# Patient Record
Sex: Male | Born: 1980 | Race: Black or African American | Hispanic: No | Marital: Single | Smoking: Never smoker
Health system: Southern US, Community
[De-identification: ages and names within clinical notes are randomized; demographics above are authoritative.]

## PROBLEM LIST (undated history)

## (undated) DIAGNOSIS — Z21 Asymptomatic human immunodeficiency virus [HIV] infection status: Secondary | ICD-10-CM

## (undated) DIAGNOSIS — B20 Human immunodeficiency virus [HIV] disease: Secondary | ICD-10-CM

## (undated) DIAGNOSIS — Z789 Other specified health status: Secondary | ICD-10-CM

## (undated) HISTORY — DX: Asymptomatic human immunodeficiency virus (hiv) infection status: Z21

## (undated) HISTORY — DX: Human immunodeficiency virus (HIV) disease: B20

## (undated) HISTORY — PX: NO PAST SURGERIES: SHX2092

---

## 2017-01-24 ENCOUNTER — Encounter (HOSPITAL_COMMUNITY): Payer: Self-pay | Admitting: Emergency Medicine

## 2017-01-24 ENCOUNTER — Ambulatory Visit (HOSPITAL_COMMUNITY)
Admission: EM | Admit: 2017-01-24 | Discharge: 2017-01-24 | Disposition: A | Discharge status: Home / Self Care | Payer: Commercial Managed Care - HMO | Attending: Nurse Practitioner | Admitting: Nurse Practitioner

## 2017-01-24 DIAGNOSIS — L0291 Cutaneous abscess, unspecified: Secondary | ICD-10-CM

## 2017-01-24 MED ORDER — SULFAMETHOXAZOLE-TRIMETHOPRIM 800-160 MG PO TABS: 1.0000 | ORAL_TABLET | Freq: Two times a day (BID) | ORAL | 0 refills | Status: AC

## 2017-01-24 NOTE — ED Triage Notes (Signed)
Here for abscess on right side of face near jaw onset 5 days associated w/some drainage, pain  Denies fevers, chills  A&O x4... NAD

## 2017-01-24 NOTE — ED Provider Notes (Signed)
CSN: [id number]     Arrival date & time 01/24/17  1506 History   First MD Initiated Contact with Patient 01/24/17 1558     Chief Complaint  Patient presents with  . Abscess   (Consider location/radiation/quality/duration/timing/severity/associated sxs/prior Treatment) Subjective:   Charles Morrison is a 36 y.o. male who presents for evaluation of an abscess located on the lower, left face. Onset of symptoms was gradual starting 4 days ago, with gradually worsening course since that time. Symptoms include pain and erythema located around the abscess. Patient denies chills and fever greater than 100. There is not a history of trauma to the area. Treatment to date has included warm compresses with minimal relief. The following portions of the patient's history were reviewed and updated as appropriate: allergies, current medications, past family history, past medical history, past social history, past surgical history and problem list.        History reviewed. No pertinent past medical history. History reviewed. No pertinent surgical history. History reviewed. No pertinent family history. Social History  Substance Use Topics  . Smoking status: Never Smoker  . Smokeless tobacco: Never Used  . Alcohol use Yes    Review of Systems  Skin: Positive for wound.    Allergies  Shellfish allergy  Home Medications   Prior to Admission medications   Medication Sig Start Date End Date Taking? Authorizing Provider  sulfamethoxazole-trimethoprim (BACTRIM DS,SEPTRA DS) 800-160 MG tablet Take 1 tablet by mouth 2 (two) times daily. 01/24/17 01/31/17  Lurline IdolSamantha Alayiah Fontes, FNP   Meds Ordered and Administered this Visit  Medications - No data to display  BP (!) 128/93 (BP Location: Left Arm) Comment: notified rn  Pulse 66   Temp 97.9 F (36.6 C) (Oral)   Resp 16   SpO2 99%  No data found.   Physical Exam  Constitutional: Charles Morrison is oriented to person, place, and time. Charles Morrison appears well-developed and  well-nourished. No distress.  HENT:  Head: Normocephalic and atraumatic.  Neck: Normal range of motion. Neck supple.  Cardiovascular: Normal rate and regular rhythm.   Pulmonary/Chest: Effort normal and breath sounds normal.  Musculoskeletal: Normal range of motion.  Neurological: Charles Morrison is alert and oriented to person, place, and time.  Skin: Skin is warm and dry.  There is an approximately 3 x 3 cm abscess to the left lower face with center area of fluctuance. No obvious surrounding cellulitis.    Urgent Care Course     .Marland Kitchen.Incision and Drainage Date/Time: 01/24/2017 4:03 PM Performed by: Lurline IdolMURRILL, Alphonsine Minium Authorized by: Lurline IdolMURRILL, Whittley Carandang   Consent:    Consent obtained:  Verbal   Consent given by:  Patient   Risks discussed:  Bleeding, incomplete drainage, infection and pain (need for further procedure)   Alternatives discussed:  No treatment Location:    Type:  Abscess   Size:  3x3 cm    Location: left lower face  Pre-procedure details:    Skin preparation:  Betadine Anesthesia (see MAR for exact dosages):    Anesthesia method:  Local infiltration   Local anesthetic:  Lidocaine 2% WITH epi Procedure type:    Complexity:  Simple Procedure details:    Needle aspiration: no     Incision types:  Single straight   Incision depth:  Subcutaneous   Scalpel blade:  10   Wound management:  Probed and deloculated   Drainage:  Bloody   Drainage amount:  Moderate   Wound treatment:  Wound left open   Packing materials:  1/4 in gauze  Post-procedure details:    Patient tolerance of procedure:  Tolerated well, no immediate complications   (including critical care time)  Labs Review Labs Reviewed - No data to display  Imaging Review No results found.   Visual Acuity Review  Right Eye Distance:   Left Eye Distance:   Bilateral Distance:    Right Eye Near:   Left Eye Near:    Bilateral Near:         MDM   1. Abscess    Abscess to left lower face, initial encounter    Antibiotics given. Wound care discussed  Educational material distributed. Follow-up in 3 days for wound check.     Lurline Idol, FNP 01/24/17 1616

## 2017-01-27 ENCOUNTER — Ambulatory Visit (HOSPITAL_COMMUNITY)
Admission: EM | Admit: 2017-01-27 | Discharge: 2017-01-27 | Disposition: A | Discharge status: Home / Self Care | Payer: Commercial Managed Care - HMO | Attending: Internal Medicine | Admitting: Internal Medicine

## 2017-01-27 ENCOUNTER — Encounter (HOSPITAL_COMMUNITY): Payer: Self-pay

## 2017-01-27 DIAGNOSIS — Z09 Encounter for follow-up examination after completed treatment for conditions other than malignant neoplasm: Secondary | ICD-10-CM

## 2017-01-27 DIAGNOSIS — L0291 Cutaneous abscess, unspecified: Secondary | ICD-10-CM

## 2017-01-27 NOTE — ED Triage Notes (Signed)
Pt here to f/u on his abscess on his left check. Pt states it isn't sore anymore and is still draining a small amount. No fever.

## 2017-01-27 NOTE — Discharge Instructions (Signed)
The packing has been removed from your wound, your wound appears healthy, without signs of infection. Continue to take the antibiotics that were prescribed at your previous visit, change the dressing daily, keep the wound clean, and dry. He can take up to 7 days or longer for it to fully close and heal. If you see any signs or symptoms of infection such as pus, expanding areas of redness, or pain, return to clinic.

## 2017-01-27 NOTE — ED Provider Notes (Signed)
CSN: [id number]     Arrival date & time 01/27/17  1212 History   First MD Initiated Contact with Patient 01/27/17 1313     Chief Complaint  Patient presents with  . Abscess   (Consider location/radiation/quality/duration/timing/severity/associated sxs/prior Treatment) 36 year old male presents to clinic for follow-up of an abscess. Patient was seen in clinic on 01/24/2017, where an I and D was performed, wound was packed with iodoform gauze, and started on Bactrim. Patient has had a significant improvement in symptoms, and continues to take the antibiotics as prescribed. He is here to have the packing removed.   The history is provided by the patient.  Abscess    History reviewed. No pertinent past medical history. History reviewed. No pertinent surgical history. No family history on file. Social History  Substance Use Topics  . Smoking status: Never Smoker  . Smokeless tobacco: Never Used  . Alcohol use Yes    Review of Systems  Constitutional: Negative.   HENT: Negative.   Respiratory: Negative.   Cardiovascular: Negative.   Gastrointestinal: Negative.   Musculoskeletal: Negative.   Skin: Positive for wound.  Neurological: Negative.     Allergies  Shellfish allergy  Home Medications   Prior to Admission medications   Medication Sig Start Date End Date Taking? Authorizing Provider  sulfamethoxazole-trimethoprim (BACTRIM DS,SEPTRA DS) 800-160 MG tablet Take 1 tablet by mouth 2 (two) times daily. 01/24/17 01/31/17  Lurline IdolMurrill, Samantha, FNP   Meds Ordered and Administered this Visit  Medications - No data to display  BP 104/76 (BP Location: Left Arm)   Pulse 80   Temp 97.7 F (36.5 C) (Oral)   Resp 16   SpO2 98%  No data found.   Physical Exam  Constitutional: He is oriented to person, place, and time. He appears well-developed and well-nourished. No distress.  HENT:  Head: Normocephalic and atraumatic.    Right Ear: External ear normal.  Left Ear: External ear  normal.  Eyes: Conjunctivae are normal. Right eye exhibits no discharge. Left eye exhibits no discharge.  Neck: Normal range of motion.  Cardiovascular: Normal rate and regular rhythm.   Pulmonary/Chest: Effort normal and breath sounds normal.  Lymphadenopathy:    He has no cervical adenopathy.  Neurological: He is alert and oriented to person, place, and time.  Skin: Skin is warm and dry. Capillary refill takes less than 2 seconds. He is not diaphoretic.  Psychiatric: He has a normal mood and affect. His behavior is normal.  Nursing note and vitals reviewed.   Urgent Care Course     Procedures (including critical care time)  Labs Review Labs Reviewed - No data to display  Imaging Review No results found.     MDM   1. Follow up   2. Abscess    Packing removed, wound inspected, appears well healing, without signs of infection. Counseling provided to continue the antibiotics as they were prescribed, change bandage daily, follow up as needed if there are any signs or symptoms of infection.    Dorena BodoKennard, Suzzette Gasparro, NP 01/27/17 1402

## 2017-10-18 ENCOUNTER — Encounter (HOSPITAL_COMMUNITY): Payer: Self-pay | Admitting: Emergency Medicine

## 2017-10-18 ENCOUNTER — Other Ambulatory Visit: Payer: Self-pay

## 2017-10-18 ENCOUNTER — Ambulatory Visit (HOSPITAL_COMMUNITY)
Admission: EM | Admit: 2017-10-18 | Discharge: 2017-10-18 | Disposition: A | Discharge status: Home / Self Care | Payer: 59 | Attending: Family Medicine | Admitting: Family Medicine

## 2017-10-18 DIAGNOSIS — L519 Erythema multiforme, unspecified: Secondary | ICD-10-CM | POA: Diagnosis not present

## 2017-10-18 MED ORDER — PREDNISONE 20 MG PO TABS: ORAL_TABLET | ORAL | 0 refills | Status: DC

## 2017-10-18 NOTE — ED Triage Notes (Signed)
Pt c/o rash on his arms and legs 2-3 weeks.

## 2017-10-18 NOTE — ED Provider Notes (Signed)
  [facility]   [id number] 10/18/17 Arrival Time: 3546   SUBJECTIVE:  Charles Morrison is a 37 y.o. male who presents to the urgent care with complaint of rash on his arms and legs 2-3 weeks.   He has tried Benadryl unsuccessfully both using the topical form and the oral form.  Patient has no arthralgias, oral sores, or pinkeye.  He has had no new medications.  He did try different deodorant recently but stopped that and went back to his original.  He works for the city [employer] [place] in [employer]  History reviewed. No pertinent past medical history. No family history on file. Social History   Socioeconomic History  . Marital status: Single    Spouse name: Not on file  . Number of children: Not on file  . Years of education: Not on file  . Highest education level: Not on file  Social Needs  . Financial resource strain: Not on file  . Food insecurity - worry: Not on file  . Food insecurity - inability: Not on file  . Transportation needs - medical: Not on file  . Transportation needs - non-medical: Not on file  Occupational History  . Not on file  Tobacco Use  . Smoking status: Never Smoker  . Smokeless tobacco: Never Used  Substance and Sexual Activity  . Alcohol use: Yes  . Drug use: No  . Sexual activity: Not on file  Other Topics Concern  . Not on file  Social History Narrative  . Not on file   No outpatient medications have been marked as taking for the 10/18/17 encounter [facility] Encounter).   Allergies  Allergen Reactions  . Shellfish Allergy       ROS: As per HPI, remainder of ROS negative.   OBJECTIVE:   Vitals:   10/18/17 1806  BP: 137/85  Pulse: 100  Resp: 18  Temp: 98.8 F (37.1 C)  SpO2: 98%     General appearance: alert; no distress Eyes: PERRL; EOMI; conjunctiva normal HENT: normocephalic; atraumatic;  oral mucosa normal Neck: supple Back: no CVA tenderness Extremities: no cyanosis or edema; symmetrical with no  gross deformities Skin: warm and dry; nodules on anterior shins, irregular confluent slightly raised erythema on both legs and dry scaly skin on arms with rough appearance. Neurologic: normal gait; grossly normal Psychological: alert and cooperative; normal mood and affect      Labs:  No results found for this or any previous visit.  Labs Reviewed - No data to display  No results found.     ASSESSMENT & PLAN:  1. Erythema multiforme     Meds ordered this encounter  Medications  . predniSONE (DELTASONE) 20 MG tablet    Sig: Two daily with food    Dispense:  10 tablet    Refill:  0    Reviewed expectations re: course of current medical issues. Questions answered. Outlined signs and symptoms indicating need for more acute intervention. Patient verbalized understanding. After Visit Summary given.    Procedures: This appears to be an allergic form of a rash.  If you are not improving in the next 48 hours, please return      Robyn Haber, MD 10/18/17 1845

## 2017-10-18 NOTE — Discharge Instructions (Signed)
This appears to be an allergic form of a rash.  If you are not improving in the next 48 hours, please return

## 2017-11-12 DIAGNOSIS — L080 Pyoderma: Secondary | ICD-10-CM | POA: Diagnosis not present

## 2017-11-12 DIAGNOSIS — L089 Local infection of the skin and subcutaneous tissue, unspecified: Secondary | ICD-10-CM | POA: Diagnosis not present

## 2017-11-12 DIAGNOSIS — M793 Panniculitis, unspecified: Secondary | ICD-10-CM | POA: Diagnosis not present

## 2017-12-08 ENCOUNTER — Encounter (HOSPITAL_COMMUNITY): Payer: Self-pay | Admitting: Emergency Medicine

## 2017-12-08 ENCOUNTER — Ambulatory Visit (HOSPITAL_COMMUNITY)
Admission: EM | Admit: 2017-12-08 | Discharge: 2017-12-08 | Disposition: A | Discharge status: Home / Self Care | Payer: 59

## 2017-12-08 DIAGNOSIS — J029 Acute pharyngitis, unspecified: Secondary | ICD-10-CM | POA: Diagnosis not present

## 2017-12-08 LAB — POCT RAPID STREP A: Streptococcus, Group A Screen (Direct): POSITIVE — AB

## 2017-12-08 MED ORDER — AMOXICILLIN 500 MG PO CAPS: 500.0000 mg | ORAL_CAPSULE | Freq: Two times a day (BID) | ORAL | 0 refills | Status: DC

## 2017-12-08 MED ORDER — ACETAMINOPHEN 325 MG PO TABS
650.0000 mg | ORAL_TABLET | Freq: Once | ORAL | Status: AC
  Administered 2017-12-08: 650 mg via ORAL

## 2017-12-08 MED ORDER — ACETAMINOPHEN 325 MG PO TABS
ORAL_TABLET | ORAL | Status: AC
  Filled 2017-12-08: qty 2

## 2017-12-08 NOTE — Discharge Instructions (Signed)

## 2017-12-08 NOTE — ED Provider Notes (Signed)
[facility]    CSN: [id number] Arrival date & time: 12/08/17  1352     History   Chief Complaint Chief Complaint  Patient presents with  . Sore Throat    HPI Charles Morrison is a 37 y.o. male no significant past medical history presenting today with sore throat.  States that 2 days ago his throat began to hurt him when he swallowed as well as when he was eating.  Denies pain at rest.  Has had minimal congestion, denies coughing.  Has had fever.  He has been using throat lozenges as well as Chloraseptic spray.  This morning he ended up having an episode of syncope that resulted in a fall.  Denies any changes in vision, one-sided weakness, headache.  HPI  History reviewed. No pertinent past medical history.  There are no active problems to display for this patient.   History reviewed. No pertinent surgical history.     Home Medications    Prior to Admission medications   Medication Sig Start Date End Date Taking? Authorizing Provider  MINOCYCLINE HCL PO Take by mouth.   Yes [provider]  amoxicillin (AMOXIL) 500 MG capsule Take 1 capsule (500 mg total) by mouth 2 (two) times daily for 10 days. 12/08/17 12/18/17  Elley Harp C, PA-C  predniSONE (DELTASONE) 20 MG tablet Two daily with food 10/18/17   Elvina Sidle, MD    Family History No family history on file.  Social History Social History   Tobacco Use  . Smoking status: Never Smoker  . Smokeless tobacco: Never Used  Substance Use Topics  . Alcohol use: Yes  . Drug use: No     Allergies   Shellfish allergy   Review of Systems Review of Systems  Constitutional: Positive for fatigue and fever. Negative for activity change and appetite change.  HENT: Positive for congestion and sore throat. Negative for ear pain, postnasal drip, rhinorrhea and sinus pressure.   Eyes: Negative for pain and itching.  Respiratory: Negative for cough and shortness of breath.   Cardiovascular:  Negative for chest pain.  Gastrointestinal: Negative for abdominal pain, diarrhea, nausea and vomiting.  Musculoskeletal: Negative for myalgias.  Skin: Negative for rash.  Neurological: Positive for syncope. Negative for dizziness, light-headedness and headaches.     Physical Exam Triage Vital Signs ED Triage Vitals  Enc Vitals Group     BP 12/08/17 1536 117/77     Pulse Rate 12/08/17 1536 (!) 111     Resp 12/08/17 1536 18     Temp 12/08/17 1536 (!) 101 F (38.3 C)     Temp src --      SpO2 12/08/17 1536 98 %     Weight --      Height --      Head Circumference --      Peak Flow --      Pain Score 12/08/17 1537 8     Pain Loc --      Pain Edu? --      Excl. in GC? --    No data found.  Updated Vital Signs BP 117/77   Pulse (!) 111   Temp (!) 101 F (38.3 C)   Resp 18   SpO2 98%   Visual Acuity Right Eye Distance:   Left Eye Distance:   Bilateral Distance:    Right Eye Near:   Left Eye Near:    Bilateral Near:     Physical Exam  Constitutional: He appears well-developed  and well-nourished.  HENT:  Head: Normocephalic and atraumatic.  Bilateral TMs not erythematous, nasal mucosa nonerythematous without rhinorrhea, posterior oropharynx and palate extremely erythematous, moderate tonsillar enlargement, no exudate, uvula midline without swelling.  No sign of abscess.  Eyes: Conjunctivae are normal.  Neck: Neck supple.  Cardiovascular: Normal rate and regular rhythm.  No murmur heard. Pulmonary/Chest: Effort normal and breath sounds normal. No respiratory distress.  Breathing comfortably at rest, CTA BL  Abdominal: Soft. There is no tenderness.  Musculoskeletal: He exhibits no edema.  Neurological: He is alert.  Skin: Skin is warm and dry.  Psychiatric: He has a normal mood and affect.  Nursing note and vitals reviewed.    UC Treatments / Results  Labs (all labs ordered are listed, but only abnormal results are displayed) Labs Reviewed  POCT RAPID  STREP A - Abnormal; Notable for the following components:      Result Value   Streptococcus, Group A Screen (Direct) POSITIVE (*)    All other components within normal limits    EKG  EKG Interpretation None       Radiology No results found.  Procedures Procedures (including critical care time)  Medications Ordered in UC Medications  acetaminophen (TYLENOL) tablet 650 mg (650 mg Oral Given 12/08/17 1543)     Initial Impression / Assessment and Plan / UC Course  I have reviewed the triage vital signs and the nursing notes.  Pertinent labs & imaging results that were available during my care of the patient were reviewed by me and considered in my medical decision making (see chart for details).     Patient tested positive for strep. No evidence of peritonsillar abscess or retropharyngeal abscess. Patient is nontoxic appearing, no drooling, dysphagia, muffled voice, or tripoding. No trismus.  Will treat with amoxicillin twice daily for 10 days.  Tylenol ibuprofen for fever.  Discussed importance of hydration and oral intake. Discussed strict return precautions. Patient verbalized understanding and is agreeable with plan.     Final Clinical Impressions(s) / UC Diagnoses   Final diagnoses:  Sore throat    ED Discharge Orders        Ordered    amoxicillin (AMOXIL) 500 MG capsule  2 times daily     12/08/17 1611       Controlled Substance Prescriptions [place] Controlled Substance Registry consulted? Not Applicable   Lew DawesWieters, Mareesa Gathright C, [place] JerseyPA-C 12/08/17 1626

## 2017-12-08 NOTE — ED Triage Notes (Signed)
Pt c/o sore throat since Friday. 

## 2017-12-13 ENCOUNTER — Observation Stay (HOSPITAL_COMMUNITY)
Admission: EM | Admit: 2017-12-13 | Discharge: 2017-12-15 | DRG: 872 | Disposition: A | Discharge status: Home / Self Care | Payer: 59 | Attending: Family Medicine | Admitting: Family Medicine

## 2017-12-13 ENCOUNTER — Encounter (HOSPITAL_COMMUNITY): Payer: Self-pay | Admitting: *Deleted

## 2017-12-13 ENCOUNTER — Emergency Department (HOSPITAL_COMMUNITY): Payer: 59

## 2017-12-13 ENCOUNTER — Observation Stay (HOSPITAL_COMMUNITY): Payer: 59

## 2017-12-13 ENCOUNTER — Other Ambulatory Visit (HOSPITAL_COMMUNITY): Payer: Self-pay

## 2017-12-13 ENCOUNTER — Encounter (HOSPITAL_COMMUNITY): Payer: Self-pay | Admitting: Emergency Medicine

## 2017-12-13 ENCOUNTER — Ambulatory Visit (HOSPITAL_COMMUNITY)
Admission: EM | Admit: 2017-12-13 | Discharge: 2017-12-13 | Disposition: A | Discharge status: Home / Self Care | Payer: 59 | Source: Home / Self Care

## 2017-12-13 ENCOUNTER — Other Ambulatory Visit: Payer: Self-pay

## 2017-12-13 DIAGNOSIS — R55 Syncope and collapse: Secondary | ICD-10-CM

## 2017-12-13 DIAGNOSIS — T360X5A Adverse effect of penicillins, initial encounter: Secondary | ICD-10-CM | POA: Diagnosis present

## 2017-12-13 DIAGNOSIS — E860 Dehydration: Secondary | ICD-10-CM | POA: Diagnosis not present

## 2017-12-13 DIAGNOSIS — A409 Streptococcal sepsis, unspecified: Principal | ICD-10-CM | POA: Diagnosis present

## 2017-12-13 DIAGNOSIS — E876 Hypokalemia: Secondary | ICD-10-CM

## 2017-12-13 DIAGNOSIS — L519 Erythema multiforme, unspecified: Secondary | ICD-10-CM | POA: Diagnosis not present

## 2017-12-13 DIAGNOSIS — R000 Tachycardia, unspecified: Secondary | ICD-10-CM

## 2017-12-13 DIAGNOSIS — Z880 Allergy status to penicillin: Secondary | ICD-10-CM

## 2017-12-13 DIAGNOSIS — N179 Acute kidney failure, unspecified: Secondary | ICD-10-CM

## 2017-12-13 DIAGNOSIS — J020 Streptococcal pharyngitis: Secondary | ICD-10-CM

## 2017-12-13 DIAGNOSIS — T7840XA Allergy, unspecified, initial encounter: Secondary | ICD-10-CM

## 2017-12-13 DIAGNOSIS — A419 Sepsis, unspecified organism: Secondary | ICD-10-CM

## 2017-12-13 DIAGNOSIS — J029 Acute pharyngitis, unspecified: Secondary | ICD-10-CM

## 2017-12-13 DIAGNOSIS — R21 Rash and other nonspecific skin eruption: Secondary | ICD-10-CM | POA: Diagnosis not present

## 2017-12-13 DIAGNOSIS — Z91013 Allergy to seafood: Secondary | ICD-10-CM | POA: Diagnosis not present

## 2017-12-13 DIAGNOSIS — I951 Orthostatic hypotension: Secondary | ICD-10-CM | POA: Diagnosis not present

## 2017-12-13 LAB — BASIC METABOLIC PANEL
ANION GAP: 13 (ref 5–15)
BUN: 17 mg/dL (ref 6–20)
CO2: 22 mmol/L (ref 22–32)
Calcium: 8.6 mg/dL — ABNORMAL LOW (ref 8.9–10.3)
Chloride: 98 mmol/L — ABNORMAL LOW (ref 101–111)
Creatinine, Ser: 1.4 mg/dL — ABNORMAL HIGH (ref 0.61–1.24)
GFR calc Af Amer: >60 mL/min (ref >60)
GFR calc non Af Amer: >60 mL/min (ref >60)
GLUCOSE: 121 mg/dL — AB (ref 65–99)
Potassium: 3 mmol/L — ABNORMAL LOW (ref 3.5–5.1)
Sodium: 133 mmol/L — ABNORMAL LOW (ref 135–145)

## 2017-12-13 LAB — PROCALCITONIN: Procalcitonin: 0.28 ng/mL

## 2017-12-13 LAB — CBC
HCT: 40.4 % (ref 39.0–52.0)
HEMOGLOBIN: 14.1 g/dL (ref 13.0–17.0)
MCH: 28 pg (ref 26.0–34.0)
MCHC: 34.9 g/dL (ref 30.0–36.0)
MCV: 80.3 fL (ref 78.0–100.0)
Platelets: 206 10*3/uL (ref 150–400)
RBC: 5.03 MIL/uL (ref 4.22–5.81)
RDW: 14.5 % (ref 11.5–15.5)
WBC: 7.2 10*3/uL (ref 4.0–10.5)

## 2017-12-13 LAB — LACTIC ACID, PLASMA: Lactic Acid, Venous: 1.3 mmol/L (ref 0.5–1.9)

## 2017-12-13 LAB — I-STAT CG4 LACTIC ACID, ED: LACTIC ACID, VENOUS: 2.16 mmol/L — AB (ref 0.5–1.9)

## 2017-12-13 LAB — MONONUCLEOSIS SCREEN: Mono Screen: NEGATIVE

## 2017-12-13 LAB — MAGNESIUM: Magnesium: 2.1 mg/dL (ref 1.7–2.4)

## 2017-12-13 MED ORDER — DIPHENHYDRAMINE HCL 50 MG/ML IJ SOLN
25.0000 mg | Freq: Two times a day (BID) | INTRAMUSCULAR | Status: DC
  Administered 2017-12-14 – 2017-12-15 (×3): 25 mg via INTRAVENOUS
  Filled 2017-12-13 (×3): qty 1

## 2017-12-13 MED ORDER — FAMOTIDINE IN NACL 20-0.9 MG/50ML-% IV SOLN
20.0000 mg | Freq: Two times a day (BID) | INTRAVENOUS | Status: DC
  Administered 2017-12-14 – 2017-12-15 (×3): 20 mg via INTRAVENOUS
  Filled 2017-12-13 (×4): qty 50

## 2017-12-13 MED ORDER — ACETAMINOPHEN 325 MG PO TABS: 650.0000 mg | ORAL_TABLET | Freq: Four times a day (QID) | ORAL | Status: DC | PRN

## 2017-12-13 MED ORDER — EPINEPHRINE 0.3 MG/0.3ML IJ SOAJ
0.3000 mg | INTRAMUSCULAR | Status: DC | PRN
  Filled 2017-12-13: qty 0.3

## 2017-12-13 MED ORDER — MENTHOL 3 MG MT LOZG
1.0000 | LOZENGE | OROMUCOSAL | Status: DC | PRN
Start: 2017-12-13 — End: 2017-12-15
  Filled 2017-12-13: qty 9

## 2017-12-13 MED ORDER — METHYLPREDNISOLONE SODIUM SUCC 125 MG IJ SOLR
60.0000 mg | Freq: Three times a day (TID) | INTRAMUSCULAR | Status: DC
  Administered 2017-12-13 – 2017-12-15 (×5): 60 mg via INTRAVENOUS
  Filled 2017-12-13 (×5): qty 2

## 2017-12-13 MED ORDER — SODIUM CHLORIDE 0.9 % IV BOLUS (SEPSIS)
2000.0000 mL | Freq: Once | INTRAVENOUS | Status: AC
  Administered 2017-12-13: 1000 mL via INTRAVENOUS

## 2017-12-13 MED ORDER — ONDANSETRON HCL 4 MG/2ML IJ SOLN: 4.0000 mg | Freq: Four times a day (QID) | INTRAMUSCULAR | Status: DC | PRN

## 2017-12-13 MED ORDER — CLINDAMYCIN PHOSPHATE 600 MG/50ML IV SOLN
600.0000 mg | Freq: Three times a day (TID) | INTRAVENOUS | Status: DC
  Administered 2017-12-13 – 2017-12-15 (×5): 600 mg via INTRAVENOUS
  Filled 2017-12-13 (×5): qty 50

## 2017-12-13 MED ORDER — ZOLPIDEM TARTRATE 5 MG PO TABS: 5.0000 mg | ORAL_TABLET | Freq: Every evening | ORAL | Status: DC | PRN

## 2017-12-13 MED ORDER — SODIUM CHLORIDE 0.9 % IV BOLUS (SEPSIS)
1000.0000 mL | Freq: Once | INTRAVENOUS | Status: AC
  Administered 2017-12-13: 1000 mL via INTRAVENOUS

## 2017-12-13 MED ORDER — POTASSIUM CHLORIDE CRYS ER 20 MEQ PO TBCR
40.0000 meq | EXTENDED_RELEASE_TABLET | Freq: Once | ORAL | Status: AC
  Administered 2017-12-13: 40 meq via ORAL
  Filled 2017-12-13: qty 2

## 2017-12-13 MED ORDER — ENOXAPARIN SODIUM 40 MG/0.4ML ~~LOC~~ SOLN
40.0000 mg | SUBCUTANEOUS | Status: DC
  Administered 2017-12-14 – 2017-12-15 (×2): 40 mg via SUBCUTANEOUS
  Filled 2017-12-13 (×2): qty 0.4

## 2017-12-13 MED ORDER — ONDANSETRON HCL 4 MG PO TABS: 4.0000 mg | ORAL_TABLET | Freq: Four times a day (QID) | ORAL | Status: DC | PRN

## 2017-12-13 MED ORDER — PREDNISONE 20 MG PO TABS: ORAL_TABLET | ORAL | 0 refills | Status: DC

## 2017-12-13 MED ORDER — SODIUM CHLORIDE 0.9 % IV SOLN
INTRAVENOUS | Status: DC
  Administered 2017-12-13 – 2017-12-14 (×2): via INTRAVENOUS

## 2017-12-13 MED ORDER — DIPHENHYDRAMINE HCL 50 MG/ML IJ SOLN
25.0000 mg | Freq: Once | INTRAMUSCULAR | Status: AC
  Administered 2017-12-13: 25 mg via INTRAVENOUS
  Filled 2017-12-13: qty 1

## 2017-12-13 MED ORDER — FAMOTIDINE IN NACL 20-0.9 MG/50ML-% IV SOLN
20.0000 mg | Freq: Once | INTRAVENOUS | Status: AC
  Administered 2017-12-13: 20 mg via INTRAVENOUS
  Filled 2017-12-13: qty 50

## 2017-12-13 MED ORDER — LACTATED RINGERS IV BOLUS (SEPSIS)
1000.0000 mL | Freq: Once | INTRAVENOUS | Status: AC
  Administered 2017-12-13: 1000 mL via INTRAVENOUS

## 2017-12-13 MED ORDER — ACETAMINOPHEN 650 MG RE SUPP: 650.0000 mg | Freq: Four times a day (QID) | RECTAL | Status: DC | PRN

## 2017-12-13 MED ORDER — DEXAMETHASONE SODIUM PHOSPHATE 10 MG/ML IJ SOLN
10.0000 mg | Freq: Once | INTRAMUSCULAR | Status: AC
  Administered 2017-12-13: 10 mg via INTRAVENOUS
  Filled 2017-12-13: qty 1

## 2017-12-13 MED ORDER — METHYLPREDNISOLONE SODIUM SUCC 125 MG IJ SOLR: 60.0000 mg | Freq: Two times a day (BID) | INTRAMUSCULAR | Status: DC

## 2017-12-13 NOTE — ED Provider Notes (Signed)
[facility] Provider Note   CSN: [id number] Arrival date & time: 12/13/17  1349     History   Chief Complaint Chief Complaint  Patient presents with  . Allergic Reaction  . Loss of Consciousness    HPI Charles Morrison is a 37 y.o. male.  The history is provided by the patient.  Allergic Reaction  Presenting symptoms: rash and swelling   Presenting symptoms: no difficulty breathing, no difficulty swallowing, no itching and no wheezing   Swelling:    Location:  Face Severity:  Moderate Duration:  5 days Prior allergic episodes:  No prior episodes Context: medications (taking amoxicillin)   Context: not food allergies and not new detergents/soaps   Relieved by:  None tried Ineffective treatments:  None tried Patient sent from urgent care for significant presyncopal symptoms and possible allergic reaction.  History reviewed. No pertinent past medical history.  Patient Active Problem List   Diagnosis Date Noted  . Allergy to amoxicillin 12/13/2017    History reviewed. No pertinent surgical history.      Home Medications    Prior to Admission medications   Medication Sig Start Date End Date Taking? Authorizing Provider  MINOCYCLINE HCL PO Take by mouth.    [provider]  predniSONE (DELTASONE) 20 MG tablet Two daily with food 12/13/17   Robyn Haber, MD    Family History History reviewed. No pertinent family history.  Social History Social History   Tobacco Use  . Smoking status: Never Smoker  . Smokeless tobacco: Never Used  Substance Use Topics  . Alcohol use: Yes  . Drug use: No     Allergies   Shellfish allergy and Amoxicillin   Review of Systems Review of Systems  Constitutional: Positive for appetite change. Negative for chills and fever.  HENT: Positive for mouth sores and sore throat. Negative for ear pain and trouble swallowing.   Eyes: Negative for pain and visual disturbance.    Respiratory: Negative for cough, shortness of breath and wheezing.   Cardiovascular: Negative for chest pain and palpitations.  Gastrointestinal: Negative for abdominal pain and vomiting.  Genitourinary: Negative for dysuria.  Musculoskeletal: Negative for back pain and neck pain.  Skin: Positive for rash. Negative for itching.  Neurological: Positive for syncope (near syncopal with standing) and light-headedness. Negative for seizures and headaches.  All other systems reviewed and are negative.    Physical Exam Updated Vital Signs BP 119/61   Pulse (!) 102   Temp 100.3 F (37.9 C) (Oral)   Resp 18   SpO2 100%   Physical Exam  Constitutional: He appears well-developed and well-nourished.  HENT:  Head: Normocephalic and atraumatic.  Multiple posterior oropharynx punctate white lesions surrounded by small area of erythema.  Lips appear mildly swollen and beefy red.  No desquamation noted on the lips or tongue.  Eyes: Conjunctivae are normal.  Neck: Neck supple.  Cardiovascular: Normal rate and regular rhythm.  No murmur heard. Pulmonary/Chest: Effort normal and breath sounds normal. No respiratory distress.  Abdominal: Soft. There is no tenderness.  Musculoskeletal: He exhibits no edema.  Neurological: He is alert.  Skin: Skin is warm and dry.  He has a diffuse hyperpigmented and mildly erythematous rash on his face, chest, bilateral upper extremities that is macular in nature.  The rash is not pruritic and does not appear to be urticarial.  Psychiatric: He has a normal mood and affect.  Nursing note and vitals reviewed.    ED Treatments /  Results  Labs (all labs ordered are listed, but only abnormal results are displayed) Labs Reviewed  BASIC METABOLIC PANEL - Abnormal; Notable for the following components:      Result Value   Sodium 133 (*)    Potassium 3.0 (*)    Chloride 98 (*)    Glucose, Bld 121 (*)    Creatinine, Ser 1.40 (*)    Calcium 8.6 (*)    All other  components within normal limits  CBC    EKG None  Radiology Dg Chest 2 View  Result Date: 12/13/2017 CLINICAL DATA:  Syncopal episode with hypotension. EXAM: CHEST - 2 VIEW COMPARISON:  None. FINDINGS: Lungs are clear. Cardiomediastinal silhouette, bones and soft tissues are normal. IMPRESSION: No active cardiopulmonary disease. Electronically Signed   By: Marin Olp M.D.   On: 12/13/2017 18:47    Procedures Procedures (including critical care time)  Medications Ordered in ED Medications  sodium chloride 0.9 % bolus 1,000 mL (has no administration in time range)  dexamethasone (DECADRON) injection 10 mg (has no administration in time range)  diphenhydrAMINE (BENADRYL) injection 25 mg (has no administration in time range)  famotidine (PEPCID) IVPB 20 mg premix (has no administration in time range)  potassium chloride SA (K-DUR,KLOR-CON) CR tablet 40 mEq (has no administration in time range)     Initial Impression / Assessment and Plan / ED Course  I have reviewed the triage vital signs and the nursing notes.  Pertinent labs & imaging results that were available during my care of the patient were reviewed by me and considered in my medical decision making (see chart for details).     Patient is a 37 year old male with no significant past medical history who presents from the urgent care for orthostatic hypotension, presyncope, diffuse rash concerning for an allergic reaction.  Patient has been on amoxicillin the last 5 days for strep pharyngitis.  He states this is the first time he has ever taken amoxicillin.  On exam patient appears uncomfortable.  Posterior oropharynx has small punctate white lesions surrounded with erythema.  He also has beefy red lips and they appear mildly swollen.  His face appears red and swollen as well.  He has no stridor or wheezing.  He has a diffuse macular hyperpigmented and erythematous rash on his chest and arms.  No rash on the palms or  soles.  Presentation concerning for erythema multiforme or mild Stevens-Johnson syndrome.  Patient also has a low-grade temperature here and is mildly tachycardic.  We will give a couple liters of IV fluids for hydration as he has had poor p.o. intake related to throat pain.  Patient may also have mononucleosis given he broke out in a rash after receiving antibiotics for strep throat.  Given patient may have a EM/SJS spectrum reaction, plan to admit the patient for observation and further workup.  While in the ED patient received IV Decadron, IV H1 and H2 blocker, IV fluids.  Final Clinical Impressions(s) / ED Diagnoses   Final diagnoses:  None  1. Erythema multiforme 2. Strep phayngitis 3. Dehydration 4. AKI  ED Discharge Orders    None       Tobie Poet, DO 12/14/17 0000

## 2017-12-13 NOTE — H&P (Addendum)
History and Physical    Charles Morrison WUJ:[id number] DOB: 14-Sep-1981 DOA: 12/13/2017  Referring MD/NP/PA:   PCP: System, Pcp Not In   Patient coming from:  The patient is coming from home.  At baseline, pt is independent for most of ADL.  Chief Complaint: allergic reaction, fever and sore throat, near syncope.   HPI: Charles Morrison is a 37 y.o. male without sigificant medical history, who presents with allergic reaction, fever and sore throat.  Pt states that because of fever and sore throat, he was seen in UC on 12/08/17, and was tested positive for strep throat. He was started with amoxicillin. He developed rash shoulders and both arms. He also has mild lip and facial swelling. No tongue swelling or SOB. He states that he has old rash in his legs and was seen by dermatologist in the past, but no clear diagnosis.   Pt states that he also took minocycline by himself on 12/14/15. He still has fever, chills and sore throat. No cough, chest pain or SOB. He has poor oral intake, and had 2 loose stool bowel movement this morning, currently no nausea, vomiting, diarrhea or abdominal pain. He states that he has ightheadedness, dizziness, and generalized weakness. He had 2 episode of near syncope, but it did not  has loss of consciousness. No unilateral weakness, numbness in extremities. No facial droop, slurred speech. Patient was reportedly to have positive orthostatic vital signs in urgent care.  ED Course: pt was found to have WBC 7.2, potassium 3.0, acute renal injury with creatinine 1.40, lactic acid 2.16, temperature 100.3, tachycardia, no tachypnea, oxygen saturation 99% on room air, negative chest x-ray. Negative CT head for acute intracranial abnormalities. Patient is placed on telemetry bed for observation.  Review of Systems:   General: has fevers, chills, no body weight gain, has poor appetite, has fatigue. Has mild lip and facial swelling. HEENT: no blurry vision, hearing changes, has  sore throat Respiratory: no dyspnea, coughing, wheezing CV: no chest pain, no palpitations GI: no nausea, vomiting, abdominal pain, had loose stool, no constipation GU: no dysuria, burning on urination, increased urinary frequency, hematuria  Ext: no leg edema Neuro: no unilateral weakness, numbness, or tingling, no vision change or hearing loss Skin: has diffuse maculopapular rash, no skin tear. MSK: No muscle spasm, no deformity, no limitation of range of movement in spin Heme: No easy bruising.  Travel history: No recent long distant travel.  Allergy:  Allergies  Allergen Reactions  . Amoxicillin Rash    Also has mild lip and facial swelling  . Shellfish Allergy     History reviewed. No pertinent past medical history.  History reviewed. No pertinent surgical history.  Social History:  reports that he has never smoked. He has never used smokeless tobacco. He reports that he drinks alcohol. He reports that he does not use drugs.  Family History:  Family History  Problem Relation Age of Onset  . Arthritis Mother        Knee     Prior to Admission medications   Medication Sig Start Date End Date Taking? Authorizing Provider  amoxicillin (AMOXIL) 500 MG capsule Take 500 mg by mouth 2 (two) times daily.   Yes [provider]  minocycline (MINOCIN,DYNACIN) 50 MG capsule Take 50 mg by mouth 2 (two) times daily. 11/12/17  Yes [provider]  predniSONE (DELTASONE) 20 MG tablet Two daily with food Patient not taking: Reported on 12/13/2017 12/13/17   Elvina Sidle, MD  Physical Exam: Vitals:   12/13/17 2200 12/13/17 2200 12/13/17 2247 12/13/17 2251  BP: 110/70  120/67   Pulse: (!) 108  74   Resp:   18   Temp:  98.3 F (36.8 C) 98.6 F (37 C)   TempSrc:  Oral Oral   SpO2: 99%  97%   Weight:    107 kg (236 lb)  Height:   5\' 10"  (1.778 m)    General: Not in acute distress HEENT:       Eyes: PERRL, EOMI, no scleral icterus.       ENT: No discharge  from the ears and nose. Multiple posterior oropharynx punctate white lesions, with pharynx injection, mild lipt swelling, no tongue swelling.       Neck: No JVD, no bruit, no mass felt. Heme: No neck lymph node enlargement. Cardiac: S1/S2, RRR, No murmurs, No gallops or rubs. Respiratory: No rales, wheezing, rhonchi or rubs. GI: Soft, nondistended, nontender, no rebound pain, no organomegaly, BS present. GU: No hematuria Ext: No pitting leg edema bilaterally. 2+DP/PT pulse bilaterally. Musculoskeletal: No joint deformities, No joint redness or warmth, no limitation of ROM in spin. Skin: has diffuse maculopapular rash in face, chest, bilateral upper extremities  Neuro: Alert, oriented X3, cranial nerves II-XII grossly intact, moves all extremities normally. Psych: Patient is not psychotic, no suicidal or hemocidal ideation.   Labs on Admission: I have personally reviewed following labs and imaging studies  CBC: Recent Labs  Lab 12/13/17 1425  WBC 7.2  HGB 14.1  HCT 40.4  MCV 80.3  PLT 206   Basic Metabolic Panel: Recent Labs  Lab 12/13/17 1425 12/13/17 2113  NA 133*  --   K 3.0*  --   CL 98*  --   CO2 22  --   GLUCOSE 121*  --   BUN 17  --   CREATININE 1.40*  --   CALCIUM 8.6*  --   MG  --  2.1   GFR: Estimated Creatinine Clearance: 88.5 mL/min (A) (by C-G formula based on SCr of 1.4 mg/dL (H)). Liver Function Tests: No results for input(s): AST, ALT, ALKPHOS, BILITOT, PROT, ALBUMIN in the last 168 hours. No results for input(s): LIPASE, AMYLASE in the last 168 hours. No results for input(s): AMMONIA in the last 168 hours. Coagulation Profile: No results for input(s): INR, PROTIME in the last 168 hours. Cardiac Enzymes: No results for input(s): CKTOTAL, CKMB, CKMBINDEX, TROPONINI in the last 168 hours. BNP (last 3 results) No results for input(s): PROBNP in the last 8760 hours. HbA1C: No results for input(s): HGBA1C in the last 72 hours. CBG: No results for  input(s): GLUCAP in the last 168 hours. Lipid Profile: No results for input(s): CHOL, HDL, LDLCALC, TRIG, CHOLHDL, LDLDIRECT in the last 72 hours. Thyroid Function Tests: No results for input(s): TSH, T4TOTAL, FREET4, T3FREE, THYROIDAB in the last 72 hours. Anemia Panel: No results for input(s): VITAMINB12, FOLATE, FERRITIN, TIBC, IRON, RETICCTPCT in the last 72 hours. Urine analysis: No results found for: COLORURINE, APPEARANCEUR, LABSPEC, PHURINE, GLUCOSEU, HGBUR, BILIRUBINUR, KETONESUR, PROTEINUR, UROBILINOGEN, NITRITE, LEUKOCYTESUR Sepsis Labs: @LABRCNTIP (procalcitonin:4,lacticidven:4) )No results found for this or any previous visit (from the past 240 hour(s)).   Radiological Exams on Admission: Dg Chest 2 View  Result Date: 12/13/2017 CLINICAL DATA:  Syncopal episode with hypotension. EXAM: CHEST - 2 VIEW COMPARISON:  None. FINDINGS: Lungs are clear. Cardiomediastinal silhouette, bones and soft tissues are normal. IMPRESSION: No active cardiopulmonary disease. Electronically Signed   By: Elberta Fortis  M.D.   On: 12/13/2017 18:47   Ct Head Wo Contrast  Result Date: 12/13/2017 CLINICAL DATA:  Rash in syncopal episode today.  Orthostasis. EXAM: CT HEAD WITHOUT CONTRAST TECHNIQUE: Contiguous axial images were obtained from the base of the skull through the vertex without intravenous contrast. COMPARISON:  None. FINDINGS: BRAIN: The ventricles and sulci are normal. No intraparenchymal hemorrhage, mass effect nor midline shift. No acute large vascular territory infarcts. Grey-white matter distinction is maintained. The basal ganglia are unremarkable. No abnormal extra-axial fluid collections. Basal cisterns are not effaced and midline. The brainstem and cerebellar hemispheres are without acute abnormalities. VASCULAR: Unremarkable. SKULL/SOFT TISSUES: No skull fracture. No significant soft tissue swelling. ORBITS/SINUSES: The included ocular globes and orbital contents are normal.The mastoid air  cells are clear. Mild maxillary and mild-to-moderate ethmoid sinus mucosal thickening is noted without air-fluid levels. OTHER: None. IMPRESSION: No acute intracranial abnormality. Mucosal thickening of the maxillary and ethmoid sinuses. Electronically Signed   By: Tollie Ethavid  Kwon M.D.   On: 12/13/2017 22:13     EKG: Independently reviewed.  Sinus rhythm, tachycardia, QTC 450, poor R-wave progression.   Assessment/Plan Principal Problem:   Allergy to amoxicillin Active Problems:   Strep throat   Pre-syncope   Rash   AKI (acute kidney injury) (HCC)   Hypokalemia   Sepsis (HCC)   Allergy to amoxicillin: pt likely had allergic reaction to amoxicillin. He developed rashes, mild lip and facial swelling, no tongue swelling. No SOB. No stridor.  -will place on tele bed for obs -will continue Solu-Medrol 60 mg tid (pt received one dose of Decadron 10 mg in ED) -IV Benadryl 25 mg bid -IV pepcid 20 mg bid -prn EpiPen -IVF  Sepsis due to Strep throat: patient meets criteria for sepsis with fever, tachycardia. Left acid is elevated at 2.16. -IV clindamycin -f/u Bx  -will get Procalcitonin and trend lactic acid levels per sepsis protocol. -IVF: 2L of NS and 1L Ringer bolus in ED, followed by 75 cc/h   Pre-syncope: likely due to orthostatic status. CT-head negative. No focal neurologic findings. -PT/OT -IV fluid as above  Rash: not itchy or tender -observe -check RPR  AKI: Likely due to prerenal secondary to dehydration and continuation. - IVF as above - Check FeNa  - will check UA - Follow up renal function by BMP  Hypokalemia: K=3.0 on admission. - Repleted - Check Mg level;  DVT ppx: SQ Lovenox Code Status: Full code Family Communication:  Yes, patient's friend at bed side Disposition Plan:  Anticipate discharge back to previous home environment Consults called:  none Admission status: Obs / tele  Date of Service 12/14/2017    Lorretta HarpXilin Neiva Maenza [facility] Pager  [phone]  If 7PM-7AM, please contact night-coverage [email or web address] Password [email or web address] 12/14/2017, 3:27 AM

## 2017-12-13 NOTE — ED Notes (Signed)
Taken to CT at this time. 

## 2017-12-13 NOTE — ED Provider Notes (Addendum)
I saw and evaluated the patient, reviewed the resident's note and I agree with the findings and plan.  ED ECG REPORT   Date: 12/13/2017  Rate: 110  Rhythm: sinus tachycardia  QRS Axis: normal  Intervals: normal  ST/T Wave abnormalities: nonspecific ST changes  Conduction Disutrbances:none  Narrative Interpretation:   Old EKG Reviewed: none available  I have personally reviewed the EKG tracing and agree with the computerized printout as noted.   EKG Interpretation None     37 year old male who presents with increased lethargy.  Recently diagnosed with strep throat has been on penicillin.  Developed a diffuse rash afterwards.  Felt to be allergic reaction.  Was at urgent care and sent here because of some syncope.  Patient has diffuse maculopapular rash here.  Does have some redness around his lips.  No obvious mucosal involvement.  Labs show evidence of renal insufficiency likely dehydration.  Patient was given IV fluids here as well as potassium supplementation.  Will monitor.   Lorre NickAllen, Nini Cavan, MD 12/13/17 Epimenio [id number]    Lorre NickAllen, Kristinia Leavy, MD 12/31/17 [phone]

## 2017-12-13 NOTE — ED Triage Notes (Signed)
Pt seen here 3/17, dx with strep and is taking antibiotics, had a rash on his shoulder when he first came, now the rash is all over both arms.

## 2017-12-13 NOTE — Discharge Instructions (Addendum)
Go to the emergency department now

## 2017-12-13 NOTE — Progress Notes (Signed)
Received report from ED RN. Room ready for patient. Vickie Ponds Joselita, RN 

## 2017-12-13 NOTE — ED Triage Notes (Signed)
Pt reports been seen at ucc on 3/17 and diagnosed with strep and started on antibiotics. Pt went back to ucc today due to rash and syncopal episodes and sent here due to being orthostatic.

## 2017-12-13 NOTE — ED Provider Notes (Addendum)
[facility]   [id number] 12/13/17 Arrival Time: 1226   SUBJECTIVE:  Charles Morrison is a 37 y.o. male who presents to the urgent care with complaint of rash.  It began a 5 days ago on the same day he started the amoxicillin.  In fact, he think it may have preceded the amoxicillin.   [facility] and again in the shower this morning.  He was able to walk in without problem.  No chest pain.  He has been having two bouts of watery stool a day since the rash began.  Patient had three bottles of Powerade before coming in today.  Patient has been taking amoxicillin for a strep infection.  History reviewed. No pertinent past medical history. No family history on file. Social History   Socioeconomic History  . Marital status: Single    Spouse name: Not on file  . Number of children: Not on file  . Years of education: Not on file  . Highest education level: Not on file  Occupational History  . Not on file  Social Needs  . Financial resource strain: Not on file  . Food insecurity:    Worry: Not on file    Inability: Not on file  . Transportation needs:    Medical: Not on file    Non-medical: Not on file  Tobacco Use  . Smoking status: Never Smoker  . Smokeless tobacco: Never Used  Substance and Sexual Activity  . Alcohol use: Yes  . Drug use: No  . Sexual activity: Not on file  Lifestyle  . Physical activity:    Days per week: Not on file    Minutes per session: Not on file  . Stress: Not on file  Relationships  . Social connections:    Talks on phone: Not on file    Gets together: Not on file    Attends religious service: Not on file    Active member of club or organization: Not on file    Attends meetings of clubs or organizations: Not on file    Relationship status: Not on file  . Intimate partner violence:    Fear of current or ex partner: Not on file    Emotionally abused: Not on file    Physically abused: Not on file     Forced sexual activity: Not on file  Other Topics Concern  . Not on file  Social History Narrative  . Not on file   No outpatient medications have been marked as taking for the 12/13/17 encounter [facility](Hospital Encounter).   Allergies  Allergen Reactions  . Shellfish Allergy       ROS: As per HPI, remainder of ROS negative.   OBJECTIVE:   Vitals:   12/13/17 1241 12/13/17 1314 12/13/17 1317  BP: 106/75 105/71 108/76  Pulse: (!) 116    Resp: 18    Temp: 98.5 F (36.9 C)    SpO2: 100%       General appearance: alert; no distress Eyes: PERRL; EOMI; conjunctiva normal HENT: normocephalic; atraumatic; TMs normal, canal normal, external ears normal without trauma; nasal mucosa normal; oral mucosa diffuse erythema Neck: supple Lungs: clear to auscultation bilaterally Heart: regular rate and rhythm Abdomen: soft, non-tender; bowel sounds normal; no masses or organomegaly; no guarding or rebound tenderness Back: no CVA tenderness Extremities: no cyanosis or edema; symmetrical with no gross deformities Skin: warm and dry        Neurologic: normal gait; grossly normal Psychological:  alert and cooperative; normal mood and affect      Labs:  Results for orders placed or performed during the hospital encounter of 12/08/17  POCT rapid strep A [facility])  Result Value Ref Range   Streptococcus, Group A Screen (Direct) POSITIVE (A) NEGATIVE      ASSESSMENT & PLAN:  1. Allergy to amoxicillin   2. Allergic reaction, initial encounter   3. Vasovagal syncope    The rash may be secondary to Mononucleosis as well as the strep.  Further testing is needed to better understand the rash and the orthostasis.  He is unstable at present even though he was able to walk into the clinic on his own power. Meds ordered this encounter  Medications  . predniSONE (DELTASONE) 20 MG tablet    Sig: Two daily with food    Dispense:  10 tablet    Refill:  0    Reviewed  expectations re: course of current medical issues. Questions answered. Outlined signs and symptoms indicating need for more acute intervention. Patient verbalized understanding. After Visit Summary given.    Procedures:  Repeated attempts at postural blood pressure readings resulted in presyncope when standing      Elvina Sidle, MD 12/13/17 1303    Elvina Sidle, MD 12/13/17 1330

## 2017-12-13 NOTE — ED Triage Notes (Signed)
When I went to pt.'s room to discuss discharge papers pt informed me that he "passed out" [facility] and again this AM at home in the bathroom.

## 2017-12-14 DIAGNOSIS — R21 Rash and other nonspecific skin eruption: Secondary | ICD-10-CM | POA: Diagnosis not present

## 2017-12-14 DIAGNOSIS — N179 Acute kidney failure, unspecified: Secondary | ICD-10-CM | POA: Diagnosis not present

## 2017-12-14 DIAGNOSIS — J029 Acute pharyngitis, unspecified: Secondary | ICD-10-CM | POA: Diagnosis not present

## 2017-12-14 DIAGNOSIS — Z880 Allergy status to penicillin: Secondary | ICD-10-CM | POA: Diagnosis not present

## 2017-12-14 DIAGNOSIS — E876 Hypokalemia: Secondary | ICD-10-CM | POA: Diagnosis not present

## 2017-12-14 DIAGNOSIS — R55 Syncope and collapse: Secondary | ICD-10-CM | POA: Diagnosis not present

## 2017-12-14 LAB — CBC
HCT: 35.7 % — ABNORMAL LOW (ref 39.0–52.0)
Hemoglobin: 12.3 g/dL — ABNORMAL LOW (ref 13.0–17.0)
MCH: 27.6 pg (ref 26.0–34.0)
MCHC: 34.5 g/dL (ref 30.0–36.0)
MCV: 80.2 fL (ref 78.0–100.0)
PLATELETS: 201 10*3/uL (ref 150–400)
RBC: 4.45 MIL/uL (ref 4.22–5.81)
RDW: 14.6 % (ref 11.5–15.5)
WBC: 3.7 10*3/uL — ABNORMAL LOW (ref 4.0–10.5)

## 2017-12-14 LAB — URINALYSIS, ROUTINE W REFLEX MICROSCOPIC
BACTERIA UA: NONE SEEN
Bilirubin Urine: NEGATIVE
GLUCOSE, UA: NEGATIVE mg/dL
KETONES UR: 20 mg/dL — AB
LEUKOCYTES UA: NEGATIVE
NITRITE: NEGATIVE
Protein, ur: 30 mg/dL — AB
Specific Gravity, Urine: 1.017 (ref 1.005–1.030)
pH: 6 (ref 5.0–8.0)

## 2017-12-14 LAB — BASIC METABOLIC PANEL
Anion gap: 11 (ref 5–15)
BUN: 9 mg/dL (ref 6–20)
CALCIUM: 7.9 mg/dL — AB (ref 8.9–10.3)
CHLORIDE: 102 mmol/L (ref 101–111)
CO2: 22 mmol/L (ref 22–32)
CREATININE: 1.01 mg/dL (ref 0.61–1.24)
GFR calc Af Amer: >60 mL/min (ref >60)
GFR calc non Af Amer: >60 mL/min (ref >60)
Glucose, Bld: 164 mg/dL — ABNORMAL HIGH (ref 65–99)
Potassium: 3.5 mmol/L (ref 3.5–5.1)
SODIUM: 135 mmol/L (ref 135–145)

## 2017-12-14 LAB — RPR: RPR Ser Ql: NONREACTIVE

## 2017-12-14 LAB — SODIUM, URINE, RANDOM: Sodium, Ur: 19 mmol/L

## 2017-12-14 LAB — RAPID URINE DRUG SCREEN, HOSP PERFORMED
AMPHETAMINES: NOT DETECTED
Barbiturates: NOT DETECTED
Benzodiazepines: NOT DETECTED
COCAINE: NOT DETECTED
Opiates: NOT DETECTED
TETRAHYDROCANNABINOL: NOT DETECTED

## 2017-12-14 LAB — LACTIC ACID, PLASMA: Lactic Acid, Venous: 1.4 mmol/L (ref 0.5–1.9)

## 2017-12-14 LAB — CREATININE, URINE, RANDOM: Creatinine, Urine: 177.28 mg/dL

## 2017-12-14 MED ORDER — SODIUM CHLORIDE 0.9 % IV SOLN
INTRAVENOUS | Status: AC
  Administered 2017-12-14: 19:00:00 via INTRAVENOUS

## 2017-12-14 NOTE — Evaluation (Addendum)
Physical Therapy Evaluation Patient Details Name: Charles Morrison MRN: [id number] DOB: 03/07/1981 Today's Date: 12/14/2017   History of Present Illness  37 y.o. male admitted for a rash/allergic reaction after taking abx and multiple sycopal episodes.   Clinical Impression  Pt presenting close to his baseline and is at a supervision level or better for all functional mobility at this time. He is able to ambulate supervision with no AD and ascend/descend steps with 1 rail. DGI 21/24 indicating pt is not at an increased risk for falls. Anticipate pt will continue to progress towards fully independent with frequent OOB mobility. Pt d/c from acute PT services at this time.     Follow Up Recommendations No PT follow up    Equipment Recommendations  None recommended by PT    Recommendations for Other Services       Precautions / Restrictions Precautions Precautions: Fall Restrictions Weight Bearing Restrictions: No      Mobility  Bed Mobility Overal bed mobility: Modified Independent             General bed mobility comments: use of bed rail  Transfers Overall transfer level: Modified independent               General transfer comment: from EOB and commode  Ambulation/Gait Ambulation/Gait assistance: Supervision Ambulation Distance (Feet): 500 Feet Assistive device: None Gait Pattern/deviations: Step-through pattern;Decreased stride length Gait velocity: decreased, able to increased with VCs Gait velocity interpretation: Below normal speed for age/gender General Gait Details: pt able to ambulate with no LOB noted. he states his legs feel a little weird after being in bed for a while. no LOB noted. VCs for cadence  Stairs Stairs: Yes Stairs assistance: Supervision Stair Management: One rail Right;Step to pattern;Forwards Number of Stairs: 4 General stair comments: pt able to safely negotiate stairs with use of rail for stability  Wheelchair Mobility     Modified Rankin (Stroke Patients Only)       Balance Overall balance assessment: Mild deficits observed, not formally tested(able to stand mod I for pericare and hand washing)                               Standardized Balance Assessment Standardized Balance Assessment : Dynamic Gait Index   Dynamic Gait Index Level Surface: Normal Change in Gait Speed: Normal Gait with Horizontal Head Turns: Normal Gait with Vertical Head Turns: Normal Gait and Pivot Turn: Mild Impairment Step Over Obstacle: Mild Impairment Step Around Obstacles: Normal Steps: Mild Impairment Total Score: 21       Pertinent Vitals/Pain Pain Assessment: No/denies pain(hiccuping throughout session)    Home Living Family/patient expects to be discharged to:: Private residence Living Arrangements: Non-relatives/Friends Available Help at Discharge: Friend(s) Type of Home: House Home Access: Stairs to enter Entrance Stairs-Rails: Left Entrance Stairs-Number of Steps: 4 Home Layout: One level Home Equipment: None Additional Comments: 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, Restaurant manager, 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 if needed    Prior Function Level of Independence: Independent               Hand Dominance   Dominant Hand: Right    Extremity/Trunk Assessment   Upper Extremity Assessment Upper Extremity Assessment: Overall WFL for tasks assessed    Lower Extremity Assessment Lower Extremity Assessment: Overall WFL for tasks assessed       Communication   Communication: No difficulties  Cognition Arousal/Alertness: Awake/alert Behavior During  Therapy: WFL for tasks assessed/performed Overall Cognitive Status: Within Functional Limits for tasks assessed                                        General Comments General comments (skin integrity, edema, etc.): pt hiccuping throughout session, pt's friend present during session    Exercises     Assessment/Plan     PT Assessment Patent does not need any further PT services  PT Problem List         PT Treatment Interventions      PT Goals (Current goals can be found in the Care Plan section)  Acute Rehab PT Goals Patient Stated Goal: to go home    Frequency     Barriers to discharge        Co-evaluation               AM-PAC PT "6 Clicks" Daily Activity  Outcome Measure Difficulty turning over in bed (including adjusting bedclothes, sheets and blankets)?: None Difficulty moving from lying on back to sitting on the side of the bed? : None Difficulty sitting down on and standing up from a chair with arms (e.g., wheelchair, bedside commode, etc,.)?: None Help needed moving to and from a bed to chair (including a wheelchair)?: None Help needed walking in hospital room?: None Help needed climbing 3-5 steps with a railing? : A Little 6 Click Score: 23    End of Session Equipment Utilized During Treatment: Gait belt Activity Tolerance: Patient tolerated treatment well Patient left: in chair;with call bell/phone within reach;with chair alarm set Nurse Communication: Mobility status PT Visit Diagnosis: Other abnormalities of gait and mobility (R26.89)    Time: 1610-9604 PT Time Calculation (min) (ACUTE ONLY): 28 min   Charges:   PT Evaluation $PT Eval Low Complexity: 1 Low     PT G Codes:        Barrie Dunker, SPT   Barrie Dunker 12/14/2017, 1:11 PM

## 2017-12-14 NOTE — Progress Notes (Signed)
PROGRESS NOTE   Charles Morrison  ZOX:[id number]    DOB: 11/21/1980    DOA: 12/13/2017  PCP: System, Pcp Not In   I have briefly reviewed patients previous medical records in [facility].  Brief Narrative:  37 year old male without significant past medical history, [facility] for fever and sore throat, tested positive for strep throat, started on amoxicillin, then developed diffuse skin rash, mild lip and facial swelling, presented to ED due to ongoing fever, sore throat, skin rash, lip swelling, couple of loose stools, dizzy and generalized weakness with 2 episodes of near syncope without LOC.  In ED, potassium 3, creatinine 1.4, negative chest x-ray and CT head.  Admitted for suspected allergic reaction to amoxicillin, streptococcal pharyngitis, near syncope and acute kidney injury.  Improving.   Assessment & Plan:   Principal Problem:   Allergy to amoxicillin Active Problems:   Strep throat   Pre-syncope   Rash   AKI (acute kidney injury) (HCC)   Hypokalemia   Sepsis (HCC)   Suspected allergic reaction to amoxicillin: Developed diffuse skin rash, mild lip and face swelling without airway compromise.  Treating with IV Solu-Medrol 60 mg 3 times daily, IV Benadryl and Pepcid.  Slowly improving, continue current management overnight.  Close monitoring.  Streptococcal pharyngitis with sepsis: Temperature of 101 F, tachycardia and elevated lactate on presentation.  Discontinued amoxicillin and listed as allergy.  IV fluids and clindamycin.  Improving.  Continue.  Blood cultures x2: Negative to date.  Near syncope: Likely due to dehydration from sepsis and orthostatic status.  CT head negative.  No focal deficits.  DC is no follow-up needs.  Telemetry: Sinus rhythm without arrhythmias.  Acute kidney injury: Secondary to sepsis and dehydration.  Resolved after IV fluids.  Hypokalemia: Replaced.  Magnesium 2.1.  Lower extremity rash: Patient reported this to be old, seen by  her dermatologist but no clear diagnosis.  Outpatient follow-up.   DVT prophylaxis: Lovenox Code Status: Full Family Communication: Discussed with friend/family at bedside. Disposition: DC home pending further clinical improvement, possibly 3/24.   Consultants:  None  Procedures:  None  Antimicrobials:  Clindamycin   Subjective: Feels better.  Lip and face swelling/rash seem to have improved.  Mild sore throat.  No difficulty swallowing or breathing.  Reports rash on bilateral upper extremities, anterior chest and back, nonpruritic, states that it is improved by "20%".  ROS: As above.  Objective:  Vitals:   12/13/17 2247 12/13/17 2251 12/14/17 0458 12/14/17 1046  BP: 120/67  114/79 107/71  Pulse: 74  80 84  Resp: 18  17 18   Temp: 98.6 F (37 C)  (!) 97.4 F (36.3 C) 98.2 F (36.8 C)  TempSrc: Oral   Oral  SpO2: 97%  100% 100%  Weight:  107 kg (236 lb)    Height: 5\' 10"  (1.778 m)       Examination:  General exam: Pleasant young male, moderately built and overweight sitting up comfortably in chair this morning.  Does not look septic or toxic. ENT: Mildly congested posterior pharyngeal wall without exudate.  No lip or tongue swelling noted. Respiratory system: Clear to auscultation. Respiratory effort normal. Cardiovascular system: S1 & S2 heard, RRR. No JVD, murmurs, rubs, gallops or clicks. No pedal edema.  Telemetry personally reviewed: Sinus rhythm. Gastrointestinal system: Abdomen is nondistended, soft and nontender. No organomegaly or masses felt. Normal bowel sounds heard. Central nervous system: Alert and oriented. No focal neurological deficits. Extremities: Symmetric 5 x 5  power. Skin: Diffuse minimally erythematous maculopapular rash on both upper extremities and some on anterior chest and upper back. Psychiatry: Judgement and insight appear normal. Mood & affect appropriate.     Data Reviewed: I have personally reviewed following labs and imaging  studies  CBC: Recent Labs  Lab 12/13/17 1425 12/14/17 0622  WBC 7.2 3.7*  HGB 14.1 12.3*  HCT 40.4 35.7*  MCV 80.3 80.2  PLT 206 201   Basic Metabolic Panel: Recent Labs  Lab 12/13/17 1425 12/13/17 2113 12/14/17 0622  NA 133*  --  135  K 3.0*  --  3.5  CL 98*  --  102  CO2 22  --  22  GLUCOSE 121*  --  164*  BUN 17  --  9  CREATININE 1.40*  --  1.01  CALCIUM 8.6*  --  7.9*  MG  --  2.1  --      Recent Results (from the past 240 hour(s))  Culture, blood (Routine X 2) w Reflex to ID Panel     Status: None (Preliminary result)   Collection Time: 12/13/17  8:06 PM  Result Value Ref Range Status   Specimen Description BLOOD HAND RIGHT  Final   Special Requests   Final    BOTTLES DRAWN AEROBIC AND ANAEROBIC Blood Culture adequate volume   Culture   Final    NO GROWTH < 24 HOURS Performed at [facility], [address], [place], [place] [address]    Report Status PENDING  Incomplete  Culture, blood (Routine X 2) w Reflex to ID Panel     Status: None (Preliminary result)   Collection Time: 12/13/17  8:18 PM  Result Value Ref Range Status   Specimen Description BLOOD HAND LEFT  Final   Special Requests   Final    BOTTLES DRAWN AEROBIC AND ANAEROBIC Blood Culture adequate volume   Culture   Final    NO GROWTH < 24 HOURS Performed at [facility], [address], [place], [place] [address]    Report Status PENDING  Incomplete         Radiology Studies: Dg Chest 2 View  Result Date: 12/13/2017 CLINICAL DATA:  Syncopal episode with hypotension. EXAM: CHEST - 2 VIEW COMPARISON:  None. FINDINGS: Lungs are clear. Cardiomediastinal silhouette, bones and soft tissues are normal. IMPRESSION: No active cardiopulmonary disease. Electronically Signed   By: Elberta Fortis M.D.   On: 12/13/2017 18:47   Ct Head Wo Contrast  Result Date: 12/13/2017 CLINICAL DATA:  Rash in syncopal episode today.  Orthostasis. EXAM: CT HEAD WITHOUT CONTRAST TECHNIQUE: Contiguous  axial images were obtained from the base of the skull through the vertex without intravenous contrast. COMPARISON:  None. FINDINGS: BRAIN: The ventricles and sulci are normal. No intraparenchymal hemorrhage, mass effect nor midline shift. No acute large vascular territory infarcts. Grey-white matter distinction is maintained. The basal ganglia are unremarkable. No abnormal extra-axial fluid collections. Basal cisterns are not effaced and midline. The brainstem and cerebellar hemispheres are without acute abnormalities. VASCULAR: Unremarkable. SKULL/SOFT TISSUES: No skull fracture. No significant soft tissue swelling. ORBITS/SINUSES: The included ocular globes and orbital contents are normal.The mastoid air cells are clear. Mild maxillary and mild-to-moderate ethmoid sinus mucosal thickening is noted without air-fluid levels. OTHER: None. IMPRESSION: No acute intracranial abnormality. Mucosal thickening of the maxillary and ethmoid sinuses. Electronically Signed   By: Tollie Eth M.D.   On: 12/13/2017 22:13        Scheduled Meds: .  diphenhydrAMINE  25 mg Intravenous BID  . enoxaparin (LOVENOX) injection  40 mg Subcutaneous Q24H  . methylPREDNISolone (SOLU-MEDROL) injection  60 mg Intravenous TID   Continuous Infusions: . sodium chloride 75 mL/hr at 12/14/17 1434  . clindamycin (CLEOCIN) IV Stopped (12/14/17 1633)  . famotidine (PEPCID) IV Stopped (12/14/17 1610)     LOS: 0 days     Marcellus Scott, MD, FACP, [facility]. [facility] Pager [phone]  If 7PM-7AM, please contact night-coverage [email or web address] Password [email or web address] 12/14/2017, 4:44 PM

## 2017-12-15 DIAGNOSIS — Z880 Allergy status to penicillin: Secondary | ICD-10-CM

## 2017-12-15 MED ORDER — PREDNISONE 20 MG PO TABS: 40.0000 mg | ORAL_TABLET | Freq: Every day | ORAL | 0 refills | Status: AC

## 2017-12-15 MED ORDER — CLINDAMYCIN HCL 300 MG PO CAPS: 300.0000 mg | ORAL_CAPSULE | Freq: Three times a day (TID) | ORAL | 0 refills | Status: AC

## 2017-12-15 NOTE — Evaluation (Signed)
Occupational Therapy Evaluation and Discharge Patient Details Name: Charles Morrison MRN: [id number] DOB: 08/25/1981 Today's Date: 12/15/2017    History of Present Illness 37 y.o. male admitted for a rash/allergic reaction after taking abx and multiple sycopal episodes.    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 Pt is currently mod I for all ADL and transfers and has no questions or concerns about self care tasks after dc. Will have support and help as needed from friends. Education complete. OT to sign off at this time. Thank you for the opportunity to serve this patient.     Follow Up Recommendations  No OT follow up    Equipment Recommendations  None recommended by OT    Recommendations for Other Services       Precautions / Restrictions Precautions Precautions: Fall Restrictions Weight Bearing Restrictions: No      Mobility Bed Mobility Overal bed mobility: Modified Independent             General bed mobility comments: use of bed rail  Transfers Overall transfer level: Modified independent               General transfer comment: from EOB and commode    Balance Overall balance assessment: Mild deficits observed, not formally tested(able to stand mod I for pericare and hand washing)                                         ADL either performed or assessed with clinical judgement   ADL Overall ADL's : Modified independent                                       General ADL Comments: no concerns for LB dressing,  toilet transfer, sink level grooming     Vision Patient Visual Report: No change from baseline       Perception     Praxis      Pertinent Vitals/Pain Pain Assessment: No/denies pain     Hand Dominance Right   Extremity/Trunk Assessment Upper Extremity Assessment Upper Extremity Assessment: Overall WFL for tasks assessed   Lower Extremity Assessment Lower  Extremity Assessment: Overall WFL for tasks assessed       Communication Communication Communication: No difficulties   Cognition Arousal/Alertness: Awake/alert Behavior During Therapy: WFL for tasks assessed/performed Overall Cognitive Status: Within Functional Limits for tasks assessed                                     General Comments  Pt's friend in room for session    Exercises     Shoulder Instructions      Home Living Family/patient expects to be discharged to:: Private residence Living Arrangements: Non-relatives/Friends Available Help at Discharge: Friend(s) Type of Home: House Home Access: Stairs to enter Secretary/administratorntrance Stairs-Number of Steps: 4 Entrance Stairs-Rails: Left Home Layout: One level     Bathroom Shower/Tub: Chief Strategy OfficerTub/shower unit   Bathroom Toilet: Standard     Home Equipment: None   Additional Comments: 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[place], Restaurant manager, 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 if needed      Prior Functioning/Environment Level of Independence: Independent  OT Problem List:        OT Treatment/Interventions:      OT Goals(Current goals can be found in the care plan section) Acute Rehab OT Goals Patient Stated Goal: to go home OT Goal Formulation: With patient Time For Goal Achievement: 12/29/17 Potential to Achieve Goals: Good  OT Frequency:     Barriers to D/C:            Co-evaluation              AM-PAC PT "6 Clicks" Daily Activity     Outcome Measure Help from another person eating meals?: None Help from another person taking care of personal grooming?: None Help from another person toileting, which includes using toliet, bedpan, or urinal?: None Help from another person bathing (including washing, rinsing, drying)?: None Help from another person to put on and taking off regular upper body clothing?: None Help from another person to put on and taking off regular lower body clothing?:  None 6 Click Score: 24   End of Session Equipment Utilized During Treatment: Gait belt Nurse Communication: Mobility status  Activity Tolerance: Patient tolerated treatment well Patient left: in bed;with call bell/phone within reach;with family/visitor present                   Time: 1610-9604 OT Time Calculation (min): 13 min Charges:  OT General Charges $OT Visit: 1 Visit OT Evaluation $OT Eval Low Complexity: 1 Low G-Codes:     Sherryl Manges OTR/L [phone]  Evern Bio Jensen [place] 12/15/2017, 12:27 PM

## 2017-12-15 NOTE — Discharge Summary (Addendum)
Physician Discharge Summary  Charles Morrison:[id number] DOB: 12/25/1980 DOA: 12/13/2017  PCP: System, Pcp Not In  Admit date: 12/13/2017 Discharge date: 12/15/2017  Admitted From: Home Disposition:  Home   Recommendations for Outpatient Follow-up:  1. Follow up with PCP in 1-2 weeks  Home Health: None  Equipment/Devices: None  Discharge Condition: Good  CODE STATUS: FULL Diet recommendation: Regular  Brief/Interim Summary: [name] is a 37 yo M previously healthy who presented with skin rash, lip and facial swelling.  The patient had been in his usual state of health until about a week ago when he developed fever and sore throat.  Went to urgent care where he tested positive for strep throat was started on amoxicillin.  While taking amoxicillin he developed a diffuse red maculopapular skin rash, lip and facial swelling, and presented to the ER with fever, sore throat, skin rash, lip swelling, loose stools, dizziness, and weakness with near syncope.    Amoxicillin allergy Amoxicillin was stopped, he was started on prednisone, diphenhydramine and ranitidine.  His rash improved.  He had no further swelling.  He had no anaphylaxis.   Group A streptococcal pharyngitis Stop amoxicillin.  Started on clindamycin, symptoms improving, discharged with clinda to complete course.        Discharge Diagnoses:  Principal Problem:   Allergy to amoxicillin Active Problems:   Strep throat   Pre-syncope   Rash    Hypokalemia   Sepsis (HCC)   Allergic reaction    Discharge Instructions  Discharge Instructions    Diet general   Complete by:  As directed    Discharge instructions   Complete by:  As directed    From Dr. Maryfrances Bunnellanford: You were admitted for an allergic reaction to amoxicillin.  Do not take amoxicillin or any pencillins again, and tell future doctors that you are allergic to penicillins, and got a rash.  You may take 3 more days of prednisone 40 mg (two tabs) in the  morning for the next three days Discard any old prednisone prescriptions.  Take Zantac (ranitidine) 150 mg twice daily for the next few days Take diphenhydramine (Benadryl) 12.5 mg (1 capsule) twice daily for the next few days.    For your strep throat:    Take clindamycin 300 mg three times daily for the next 5 days then stop    Take a probiotic while you are taking the clindamycin (these are healthy bacteria that live in your intestines, there are many kinds of probiotic, you get them over the counter, you can try lactobacillus, bifidobacteria, or any other kind of probiotic, the pharmacist can point you in the right direction)   Increase activity slowly   Complete by:  As directed      Allergies as of 12/15/2017      Reactions   Amoxicillin Rash   Also has mild lip and facial swelling   Shellfish Allergy       Medication List    STOP taking these medications   amoxicillin 500 MG capsule Commonly known as:  AMOXIL     TAKE these medications   clindamycin 300 MG capsule Commonly known as:  CLEOCIN Take 1 capsule (300 mg total) by mouth 3 (three) times daily for 5 days.   minocycline 50 MG capsule Commonly known as:  MINOCIN,DYNACIN Take 50 mg by mouth 2 (two) times daily.   predniSONE 20 MG tablet Commonly known as:  DELTASONE Take 2 tablets (40 mg total) by mouth daily with breakfast for  3 doses. Two daily with food What changed:    how much to take  how to take this  when to take this       Allergies  Allergen Reactions  . Amoxicillin Rash    Also has mild lip and facial swelling  . Shellfish Allergy     Consultations:  None   Procedures/Studies: Dg Chest 2 View  Result Date: 12/13/2017 CLINICAL DATA:  Syncopal episode with hypotension. EXAM: CHEST - 2 VIEW COMPARISON:  None. FINDINGS: Lungs are clear. Cardiomediastinal silhouette, bones and soft tissues are normal. IMPRESSION: No active cardiopulmonary disease. Electronically Signed   By: Elberta Fortis M.D.   On: 12/13/2017 18:47   Ct Head Wo Contrast  Result Date: 12/13/2017 CLINICAL DATA:  Rash in syncopal episode today.  Orthostasis. EXAM: CT HEAD WITHOUT CONTRAST TECHNIQUE: Contiguous axial images were obtained from the base of the skull through the vertex without intravenous contrast. COMPARISON:  None. FINDINGS: BRAIN: The ventricles and sulci are normal. No intraparenchymal hemorrhage, mass effect nor midline shift. No acute large vascular territory infarcts. Grey-white matter distinction is maintained. The basal ganglia are unremarkable. No abnormal extra-axial fluid collections. Basal cisterns are not effaced and midline. The brainstem and cerebellar hemispheres are without acute abnormalities. VASCULAR: Unremarkable. SKULL/SOFT TISSUES: No skull fracture. No significant soft tissue swelling. ORBITS/SINUSES: The included ocular globes and orbital contents are normal.The mastoid air cells are clear. Mild maxillary and mild-to-moderate ethmoid sinus mucosal thickening is noted without air-fluid levels. OTHER: None. IMPRESSION: No acute intracranial abnormality. Mucosal thickening of the maxillary and ethmoid sinuses. Electronically Signed   By: Tollie Eth M.D.   On: 12/13/2017 22:13       Subjective: Feels well.  Rash stable, no new lesions.  No lip swelling, no tongue swelling.  Throat improved.  No wheezing, dizziness.  Discharge Exam: Vitals:   12/15/17 0517 12/15/17 1000  BP: 110/77 102/68  Pulse: 65 99  Resp: 16 18  Temp: 98 F (36.7 C) 98.6 F (37 C)  SpO2: 100% 100%   Vitals:   12/14/17 1723 12/14/17 2102 12/15/17 0517 12/15/17 1000  BP: 106/75 107/76 110/77 102/68  Pulse: 74 71 65 99  Resp: 18 18 16 18   Temp: 98.4 F (36.9 C) 97.6 F (36.4 C) 98 F (36.7 C) 98.6 F (37 C)  TempSrc: Oral Oral Oral Oral  SpO2: 100% 97% 100% 100%  Weight:  107.1 kg (236 lb)    Height:        General: Pt is alert, awake, not in acute distress HEENT: Posterior erythema.   Few scattered exudates on tonsils. Neck: Cervical lymphadenopathy noted. Cardiovascular: RRR, S1/S2 +, no rubs, no gallops Respiratory: CTA bilaterally, no wheezing, no rhonchi Abdominal: Soft, NT, ND, bowel sounds + Extremities: no edema, no cyanosis Rash: Nonblanching macular red rash on chest, back, neck, abdomen, arms and legs.    The results of significant diagnostics from this hospitalization (including imaging, microbiology, ancillary and laboratory) are listed below for reference.     Microbiology: Recent Results (from the past 240 hour(s))  Culture, blood (Routine X 2) w Reflex to ID Panel     Status: None (Preliminary result)   Collection Time: 12/13/17  8:06 PM  Result Value Ref Range Status   Specimen Description BLOOD HAND RIGHT  Final   Special Requests   Final    BOTTLES DRAWN AEROBIC AND ANAEROBIC Blood Culture adequate volume   Culture   Final    NO GROWTH <  24 HOURS Performed at [facility], [address] [place], [place] [address]    Report Status PENDING  Incomplete  Culture, blood (Routine X 2) w Reflex to ID Panel     Status: None (Preliminary result)   Collection Time: 12/13/17  8:18 PM  Result Value Ref Range Status   Specimen Description BLOOD HAND LEFT  Final   Special Requests   Final    BOTTLES DRAWN AEROBIC AND ANAEROBIC Blood Culture adequate volume   Culture   Final    NO GROWTH < 24 HOURS Performed at [facility], [address] [place], [place] [address]    Report Status PENDING  Incomplete     Labs: BNP (last 3 results) No results for input(s): BNP in the last 8760 hours. Basic Metabolic Panel: Recent Labs  Lab 12/13/17 1425 12/13/17 2113 12/14/17 0622  NA 133*  --  135  K 3.0*  --  3.5  CL 98*  --  102  CO2 22  --  22  GLUCOSE 121*  --  164*  BUN 17  --  9  CREATININE 1.40*  --  1.01  CALCIUM 8.6*  --  7.9*  MG  --  2.1  --    Liver Function Tests: No results for input(s): AST, ALT, ALKPHOS, BILITOT, PROT,  ALBUMIN in the last 168 hours. No results for input(s): LIPASE, AMYLASE in the last 168 hours. No results for input(s): AMMONIA in the last 168 hours. CBC: Recent Labs  Lab 12/13/17 1425 12/14/17 0622  WBC 7.2 3.7*  HGB 14.1 12.3*  HCT 40.4 35.7*  MCV 80.3 80.2  PLT 206 201   Cardiac Enzymes: No results for input(s): CKTOTAL, CKMB, CKMBINDEX, TROPONINI in the last 168 hours. BNP: Invalid input(s): POCBNP CBG: No results for input(s): GLUCAP in the last 168 hours. D-Dimer No results for input(s): DDIMER in the last 72 hours. Hgb A1c No results for input(s): HGBA1C in the last 72 hours. Lipid Profile No results for input(s): CHOL, HDL, LDLCALC, TRIG, CHOLHDL, LDLDIRECT in the last 72 hours. Thyroid function studies No results for input(s): TSH, T4TOTAL, T3FREE, THYROIDAB in the last 72 hours.  Invalid input(s): FREET3 Anemia work up No results for input(s): VITAMINB12, FOLATE, FERRITIN, TIBC, IRON, RETICCTPCT in the last 72 hours. Urinalysis    Component Value Date/Time   COLORURINE YELLOW 12/14/2017 0505   APPEARANCEUR CLEAR 12/14/2017 0505   LABSPEC 1.017 12/14/2017 0505   PHURINE 6.0 12/14/2017 0505   GLUCOSEU NEGATIVE 12/14/2017 0505   HGBUR SMALL (A) 12/14/2017 0505   BILIRUBINUR NEGATIVE 12/14/2017 0505   KETONESUR 20 (A) 12/14/2017 0505   PROTEINUR 30 (A) 12/14/2017 0505   NITRITE NEGATIVE 12/14/2017 0505   LEUKOCYTESUR NEGATIVE 12/14/2017 0505   Sepsis Labs Invalid input(s): PROCALCITONIN,  WBC,  LACTICIDVEN Microbiology Recent Results (from the past 240 hour(s))  Culture, blood (Routine X 2) w Reflex to ID Panel     Status: None (Preliminary result)   Collection Time: 12/13/17  8:06 PM  Result Value Ref Range Status   Specimen Description BLOOD HAND RIGHT  Final   Special Requests   Final    BOTTLES DRAWN AEROBIC AND ANAEROBIC Blood Culture adequate volume   Culture   Final    NO GROWTH < 24 HOURS Performed at [facility], [address],  [place], [place] [address]    Report Status PENDING  Incomplete  Culture, blood (Routine X 2) w Reflex to ID Panel  Status: None (Preliminary result)   Collection Time: 12/13/17  8:18 PM  Result Value Ref Range Status   Specimen Description BLOOD HAND LEFT  Final   Special Requests   Final    BOTTLES DRAWN AEROBIC AND ANAEROBIC Blood Culture adequate volume   Culture   Final    NO GROWTH < 24 HOURS Performed at [facility], [address], [place], [place] [address]    Report Status PENDING  Incomplete     Time coordinating discharge: More than 30 minutes  SIGNED:   Alberteen Sam, MD  [facility] 12/15/2017, 10:41 AM

## 2017-12-16 ENCOUNTER — Telehealth: Payer: Self-pay | Admitting: Internal Medicine

## 2017-12-16 LAB — HIV ANTIBODY (ROUTINE TESTING W REFLEX): HIV Screen 4th Generation wRfx: REACTIVE — AB

## 2017-12-16 LAB — HIV 1/2 AB DIFFERENTIATION
HIV 1 AB: POSITIVE — AB
HIV 2 AB: NEGATIVE

## 2017-12-16 NOTE — Telephone Encounter (Signed)
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  I cannot see in his current record any previous diagnosis of HIV.  He was recently seen for sore throat and found to have group A streptococcal pharyngitis.  He was started on amoxicillin and also took some minocycline.  He developed a diffuse rash and diarrhea and was admitted recently.  He was discharged yesterday.  I will [facility] who will contact him and get him into our clinic as soon as possible.

## 2017-12-18 LAB — CULTURE, BLOOD (ROUTINE X 2)
CULTURE: NO GROWTH
Culture: NO GROWTH
SPECIAL REQUESTS: ADEQUATE
SPECIAL REQUESTS: ADEQUATE

## 2017-12-18 NOTE — Telephone Encounter (Signed)
Information sent to the state to find and direct to care.

## 2018-05-14 ENCOUNTER — Inpatient Hospital Stay (HOSPITAL_COMMUNITY): Payer: 59

## 2018-05-14 ENCOUNTER — Inpatient Hospital Stay (HOSPITAL_COMMUNITY)
Admission: EM | Admit: 2018-05-14 | Discharge: 2018-05-20 | DRG: 974 | Disposition: A | Discharge status: Home / Self Care | Payer: 59 | Attending: Internal Medicine | Admitting: Internal Medicine

## 2018-05-14 ENCOUNTER — Emergency Department (HOSPITAL_COMMUNITY): Payer: 59

## 2018-05-14 ENCOUNTER — Encounter (HOSPITAL_COMMUNITY): Payer: Self-pay | Admitting: Radiology

## 2018-05-14 DIAGNOSIS — D649 Anemia, unspecified: Secondary | ICD-10-CM

## 2018-05-14 DIAGNOSIS — R0902 Hypoxemia: Secondary | ICD-10-CM | POA: Diagnosis not present

## 2018-05-14 DIAGNOSIS — K7689 Other specified diseases of liver: Secondary | ICD-10-CM | POA: Diagnosis not present

## 2018-05-14 DIAGNOSIS — Z8261 Family history of arthritis: Secondary | ICD-10-CM | POA: Diagnosis not present

## 2018-05-14 DIAGNOSIS — Z21 Asymptomatic human immunodeficiency virus [HIV] infection status: Secondary | ICD-10-CM | POA: Diagnosis present

## 2018-05-14 DIAGNOSIS — R160 Hepatomegaly, not elsewhere classified: Secondary | ICD-10-CM | POA: Diagnosis present

## 2018-05-14 DIAGNOSIS — D638 Anemia in other chronic diseases classified elsewhere: Secondary | ICD-10-CM | POA: Diagnosis present

## 2018-05-14 DIAGNOSIS — B20 Human immunodeficiency virus [HIV] disease: Secondary | ICD-10-CM | POA: Diagnosis not present

## 2018-05-14 DIAGNOSIS — J9601 Acute respiratory failure with hypoxia: Secondary | ICD-10-CM | POA: Diagnosis present

## 2018-05-14 DIAGNOSIS — B59 Pneumocystosis: Secondary | ICD-10-CM | POA: Diagnosis present

## 2018-05-14 DIAGNOSIS — E876 Hypokalemia: Secondary | ICD-10-CM | POA: Diagnosis not present

## 2018-05-14 DIAGNOSIS — R0602 Shortness of breath: Secondary | ICD-10-CM | POA: Diagnosis not present

## 2018-05-14 DIAGNOSIS — R7881 Bacteremia: Secondary | ICD-10-CM | POA: Diagnosis not present

## 2018-05-14 DIAGNOSIS — R918 Other nonspecific abnormal finding of lung field: Secondary | ICD-10-CM | POA: Diagnosis not present

## 2018-05-14 DIAGNOSIS — A419 Sepsis, unspecified organism: Secondary | ICD-10-CM | POA: Diagnosis not present

## 2018-05-14 DIAGNOSIS — R9431 Abnormal electrocardiogram [ECG] [EKG]: Secondary | ICD-10-CM | POA: Diagnosis not present

## 2018-05-14 DIAGNOSIS — K760 Fatty (change of) liver, not elsewhere classified: Secondary | ICD-10-CM | POA: Diagnosis present

## 2018-05-14 DIAGNOSIS — Z880 Allergy status to penicillin: Secondary | ICD-10-CM

## 2018-05-14 DIAGNOSIS — J189 Pneumonia, unspecified organism: Secondary | ICD-10-CM | POA: Diagnosis not present

## 2018-05-14 DIAGNOSIS — Z91013 Allergy to seafood: Secondary | ICD-10-CM | POA: Diagnosis not present

## 2018-05-14 DIAGNOSIS — Z881 Allergy status to other antibiotic agents status: Secondary | ICD-10-CM | POA: Diagnosis not present

## 2018-05-14 DIAGNOSIS — R0600 Dyspnea, unspecified: Secondary | ICD-10-CM | POA: Diagnosis not present

## 2018-05-14 DIAGNOSIS — R197 Diarrhea, unspecified: Secondary | ICD-10-CM | POA: Diagnosis not present

## 2018-05-14 HISTORY — DX: Other specified health status: Z78.9

## 2018-05-14 LAB — CBC WITH DIFFERENTIAL/PLATELET
BASOS ABS: 0 10*3/uL (ref 0.0–0.1)
BASOS PCT: 0 %
EOS ABS: 0 10*3/uL (ref 0.0–0.7)
EOS PCT: 0 %
HCT: 32.5 % — ABNORMAL LOW (ref 39.0–52.0)
HEMOGLOBIN: 10.6 g/dL — AB (ref 13.0–17.0)
LYMPHS ABS: 1.2 10*3/uL (ref 0.7–4.0)
Lymphocytes Relative: 16 %
MCH: 24.7 pg — AB (ref 26.0–34.0)
MCHC: 32.6 g/dL (ref 30.0–36.0)
MCV: 75.8 fL — ABNORMAL LOW (ref 78.0–100.0)
Monocytes Absolute: 0.6 10*3/uL (ref 0.1–1.0)
Monocytes Relative: 8 %
NEUTROS PCT: 76 %
Neutro Abs: 6 10*3/uL (ref 1.7–7.7)
PLATELETS: 546 10*3/uL — AB (ref 150–400)
RBC: 4.29 MIL/uL (ref 4.22–5.81)
RDW: 14.9 % (ref 11.5–15.5)
WBC: 7.9 10*3/uL (ref 4.0–10.5)

## 2018-05-14 LAB — BRAIN NATRIURETIC PEPTIDE: B NATRIURETIC PEPTIDE 5: 14.5 pg/mL (ref 0.0–100.0)

## 2018-05-14 LAB — COMPREHENSIVE METABOLIC PANEL
ALBUMIN: 2.5 g/dL — AB (ref 3.5–5.0)
ALT: 20 U/L (ref 0–44)
AST: 40 U/L (ref 15–41)
Alkaline Phosphatase: 49 U/L (ref 38–126)
Anion gap: 14 (ref 5–15)
BILIRUBIN TOTAL: 0.8 mg/dL (ref 0.3–1.2)
BUN: 9 mg/dL (ref 6–20)
CO2: 26 mmol/L (ref 22–32)
Calcium: 8.3 mg/dL — ABNORMAL LOW (ref 8.9–10.3)
Chloride: 101 mmol/L (ref 98–111)
Creatinine, Ser: 0.82 mg/dL (ref 0.61–1.24)
GFR calc non Af Amer: >60 mL/min (ref >60)
GLUCOSE: 101 mg/dL — AB (ref 70–99)
POTASSIUM: 2.7 mmol/L — AB (ref 3.5–5.1)
SODIUM: 141 mmol/L (ref 135–145)
TOTAL PROTEIN: 7.9 g/dL (ref 6.5–8.1)

## 2018-05-14 LAB — IRON AND TIBC
IRON: 15 ug/dL — AB (ref 45–182)
Saturation Ratios: 9 % — ABNORMAL LOW (ref 17.9–39.5)
TIBC: 171 ug/dL — AB (ref 250–450)
UIBC: 156 ug/dL

## 2018-05-14 LAB — MAGNESIUM: MAGNESIUM: 2.2 mg/dL (ref 1.7–2.4)

## 2018-05-14 LAB — RETICULOCYTES
RBC.: 3.93 MIL/uL — ABNORMAL LOW (ref 4.22–5.81)
RETIC COUNT ABSOLUTE: 23.6 10*3/uL (ref 19.0–186.0)
Retic Ct Pct: 0.6 % (ref 0.4–3.1)

## 2018-05-14 LAB — FERRITIN: FERRITIN: 232 ng/mL (ref 24–336)

## 2018-05-14 LAB — LIPASE, BLOOD: Lipase: 22 U/L (ref 11–51)

## 2018-05-14 LAB — I-STAT CG4 LACTIC ACID, ED: LACTIC ACID, VENOUS: 1.27 mmol/L (ref 0.5–1.9)

## 2018-05-14 LAB — I-STAT TROPONIN, ED: Troponin i, poc: 0 ng/mL (ref 0.00–0.08)

## 2018-05-14 LAB — STREP PNEUMONIAE URINARY ANTIGEN: Strep Pneumo Urinary Antigen: NEGATIVE

## 2018-05-14 MED ORDER — SODIUM CHLORIDE 0.9% FLUSH
3.0000 mL | Freq: Two times a day (BID) | INTRAVENOUS | Status: DC
  Administered 2018-05-15 – 2018-05-20 (×5): 3 mL via INTRAVENOUS

## 2018-05-14 MED ORDER — IOPAMIDOL (ISOVUE-370) INJECTION 76%
INTRAVENOUS | Status: AC
  Filled 2018-05-14: qty 100

## 2018-05-14 MED ORDER — SODIUM CHLORIDE 0.9 % IV SOLN
250.0000 mL | INTRAVENOUS | Status: DC | PRN
  Administered 2018-05-17: 250 mL via INTRAVENOUS

## 2018-05-14 MED ORDER — IPRATROPIUM-ALBUTEROL 0.5-2.5 (3) MG/3ML IN SOLN
3.0000 mL | Freq: Once | RESPIRATORY_TRACT | Status: AC
  Administered 2018-05-14: 3 mL via RESPIRATORY_TRACT
  Filled 2018-05-14: qty 3

## 2018-05-14 MED ORDER — SODIUM CHLORIDE 0.9% FLUSH
3.0000 mL | INTRAVENOUS | Status: DC | PRN
  Administered 2018-05-20: 3 mL via INTRAVENOUS
  Filled 2018-05-14: qty 3

## 2018-05-14 MED ORDER — POTASSIUM CHLORIDE 10 MEQ/100ML IV SOLN
10.0000 meq | INTRAVENOUS | Status: AC
  Administered 2018-05-14 – 2018-05-15 (×3): 10 meq via INTRAVENOUS
  Filled 2018-05-14 (×3): qty 100

## 2018-05-14 MED ORDER — POTASSIUM CHLORIDE 10 MEQ/100ML IV SOLN
10.0000 meq | INTRAVENOUS | Status: AC
  Administered 2018-05-14 (×2): 10 meq via INTRAVENOUS
  Filled 2018-05-14 (×2): qty 100

## 2018-05-14 MED ORDER — POTASSIUM CHLORIDE CRYS ER 20 MEQ PO TBCR
40.0000 meq | EXTENDED_RELEASE_TABLET | Freq: Once | ORAL | Status: AC
  Administered 2018-05-14: 40 meq via ORAL
  Filled 2018-05-14: qty 2

## 2018-05-14 MED ORDER — LEVOFLOXACIN IN D5W 750 MG/150ML IV SOLN
750.0000 mg | Freq: Once | INTRAVENOUS | Status: AC
  Administered 2018-05-14: 750 mg via INTRAVENOUS
  Filled 2018-05-14: qty 150

## 2018-05-14 MED ORDER — ACETAMINOPHEN 500 MG PO TABS
1000.0000 mg | ORAL_TABLET | Freq: Once | ORAL | Status: AC
  Administered 2018-05-14: 1000 mg via ORAL
  Filled 2018-05-14: qty 2

## 2018-05-14 MED ORDER — SODIUM CHLORIDE 0.9 % IV BOLUS
500.0000 mL | Freq: Once | INTRAVENOUS | Status: AC
  Administered 2018-05-14: 500 mL via INTRAVENOUS

## 2018-05-14 MED ORDER — LEVOFLOXACIN IN D5W 750 MG/150ML IV SOLN
750.0000 mg | INTRAVENOUS | Status: DC
  Administered 2018-05-15 – 2018-05-16 (×2): 750 mg via INTRAVENOUS
  Filled 2018-05-14 (×2): qty 150

## 2018-05-14 MED ORDER — IOPAMIDOL (ISOVUE-370) INJECTION 76%
100.0000 mL | Freq: Once | INTRAVENOUS | Status: AC | PRN
  Administered 2018-05-14: 100 mL via INTRAVENOUS

## 2018-05-14 MED ORDER — ENOXAPARIN SODIUM 40 MG/0.4ML ~~LOC~~ SOLN
40.0000 mg | SUBCUTANEOUS | Status: DC
  Administered 2018-05-14 – 2018-05-17 (×3): 40 mg via SUBCUTANEOUS
  Filled 2018-05-14 (×6): qty 0.4

## 2018-05-14 MED ORDER — POTASSIUM CHLORIDE CRYS ER 20 MEQ PO TBCR
40.0000 meq | EXTENDED_RELEASE_TABLET | Freq: Once | ORAL | Status: AC
  Administered 2018-05-15: 40 meq via ORAL

## 2018-05-14 NOTE — ED Triage Notes (Signed)
Transported by [facility] from home--SHOB x 4 days. Productive cough with white sputum since last night. 88 % on RA, EMS placed patient on 4 L of O2 and oxygen saturation maintained at 100%. All other vital signs stable.

## 2018-05-14 NOTE — ED Notes (Signed)
ED Provider at bedside. 

## 2018-05-14 NOTE — ED Notes (Signed)
ED TO INPATIENT HANDOFF REPORT  Name/Age/Gender Charles Morrison 37 y.o. male  Code Status    Code Status Orders  (From admission, onward)         Start     Ordered   05/14/18 1430  Full code  Continuous     05/14/18 1431        Code Status History    Date Active Date Inactive Code Status Order ID Comments User Context   12/13/2017 2058 12/15/2017 1749 Full Code [id number]  Ivor Costa, MD ED    Advance Directive Documentation     Most Recent Value  Type of Advance Directive  Healthcare Power of Attorney  Pre-existing out of facility DNR order (yellow form or pink MOST form)  -  "MOST" Form in Place?  -      Home/SNF/Other Home  Chief Complaint SOB  Level of Care/Admitting Diagnosis ED Disposition    ED Disposition Condition [place]: [facility] [[id number]]  Level of Care: Med-Surg [16]  Diagnosis: Pneumonia [007622]  Admitting Physician: [place], [place]  Attending Physician: Debbe Odea [[id number]]  Estimated length of stay: past midnight tomorrow  Certification:: I certify this patient will need inpatient services for at least 2 midnights  PT Class (Do Not Modify): Inpatient [101]  PT Acc Code (Do Not Modify): Private [1]       Medical History Past Medical History:  Diagnosis Date  . Medical history non-contributory     Allergies Allergies  Allergen Reactions  . Amoxicillin Rash    Also has mild lip and facial swelling  . Shellfish Allergy Rash    Also had mild lip and facial swelling    IV Location/Drains/Wounds Patient Lines/Drains/Airways Status   Active Line/Drains/Airways    Name:   Placement date:   Placement time:   Site:   Days:   Peripheral IV 05/14/18 Left Antecubital   05/14/18    1033    Antecubital   less than 1          Labs/Imaging Results for orders placed or performed during the hospital encounter of 05/14/18 (from the past 48 hour(s))  Comprehensive metabolic panel     Status:  Abnormal   Collection Time: 05/14/18 11:05 AM  Result Value Ref Range   Sodium 141 135 - 145 mmol/L   Potassium 2.7 (LL) 3.5 - 5.1 mmol/L    Comment: CRITICAL RESULT CALLED TO, READ BACK BY AND VERIFIED WITH: T SMITH,RN 05/14/18 1150 RHOLMES    Chloride 101 98 - 111 mmol/L   CO2 26 22 - 32 mmol/L   Glucose, Bld 101 (H) 70 - 99 mg/dL   BUN 9 6 - 20 mg/dL   Creatinine, Ser 0.82 0.61 - 1.24 mg/dL   Calcium 8.3 (L) 8.9 - 10.3 mg/dL   Total Protein 7.9 6.5 - 8.1 g/dL   Albumin 2.5 (L) 3.5 - 5.0 g/dL   AST 40 15 - 41 U/L   ALT 20 0 - 44 U/L   Alkaline Phosphatase 49 38 - 126 U/L   Total Bilirubin 0.8 0.3 - 1.2 mg/dL   GFR calc non Af Amer >60 >60 mL/min   GFR calc Af Amer >60 >60 mL/min    Comment: (NOTE) The eGFR has been calculated using the CKD EPI equation. This calculation has not been validated in all clinical situations. eGFR's persistently <60 mL/min signify possible Chronic Kidney Disease.    Anion gap 14 5 - 15  Comment: Performed at [facility], [place] [address], [place], [place] [address]  Lipase, blood     Status: None   Collection Time: 05/14/18 11:05 AM  Result Value Ref Range   Lipase 22 11 - 51 U/L    Comment: Performed at [facility], [place] [address] [place], [place] [address]  Brain natriuretic peptide     Status: None   Collection Time: 05/14/18 11:05 AM  Result Value Ref Range   B Natriuretic Peptide 14.5 0.0 - 100.0 pg/mL    Comment: Performed at [facility], [place] [address], [place], [place] [address]  CBC with Differential     Status: Abnormal   Collection Time: 05/14/18 11:05 AM  Result Value Ref Range   WBC 7.9 4.0 - 10.5 K/uL   RBC 4.29 4.22 - 5.81 MIL/uL   Hemoglobin 10.6 (L) 13.0 - 17.0 g/dL   HCT 32.5 (L) 39.0 - 52.0 %   MCV 75.8 (L) 78.0 - 100.0 fL   MCH 24.7 (L) 26.0 - 34.0 pg   MCHC 32.6 30.0 - 36.0 g/dL   RDW 14.9 11.5 - 15.5 %   Platelets 546 (H) 150 - 400 K/uL   Neutrophils  Relative % 76 %   Neutro Abs 6.0 1.7 - 7.7 K/uL   Lymphocytes Relative 16 %   Lymphs Abs 1.2 0.7 - 4.0 K/uL   Monocytes Relative 8 %   Monocytes Absolute 0.6 0.1 - 1.0 K/uL   Eosinophils Relative 0 %   Eosinophils Absolute 0.0 0.0 - 0.7 K/uL   Basophils Relative 0 %   Basophils Absolute 0.0 0.0 - 0.1 K/uL    Comment: Performed at [facility], [place] [address], [place], [place] [address]  Magnesium     Status: None   Collection Time: 05/14/18 11:05 AM  Result Value Ref Range   Magnesium 2.2 1.7 - 2.4 mg/dL    Comment: Performed at [facility], [place] [address], [place], [place] [address]  I-stat troponin, ED     Status: None   Collection Time: 05/14/18 11:08 AM  Result Value Ref Range   Troponin i, poc 0.00 0.00 - 0.08 ng/mL   Comment 3            Comment: Due to the release kinetics of cTnI, a negative result within the first hours of the onset of symptoms does not rule out myocardial infarction with certainty. If myocardial infarction is still suspected, repeat the test at appropriate intervals.   I-Stat CG4 Lactic Acid, ED  (not at  [facility])     Status: None   Collection Time: 05/14/18 11:09 AM  Result Value Ref Range   Lactic Acid, Venous 1.27 0.5 - 1.9 mmol/L   Dg Chest 2 View  Result Date: 05/14/2018 CLINICAL DATA:  Chest pain, shortness of breath for days EXAM: CHEST - 2 VIEW COMPARISON:  12/13/2017 FINDINGS: There are bilateral interstitial and alveolar airspace opacities. There is no pleural effusion or pneumothorax. The heart and mediastinal contours are unremarkable. The osseous structures are unremarkable. IMPRESSION: 1. Bilateral interstitial and alveolar airspace opacities concerning for mild pulmonary edema versus multilobar pneumonia. Electronically Signed   By: Kathreen Devoid   On: 05/14/2018 11:07   Ct Angio Chest Pe W And/or Wo Contrast  Result Date: 05/14/2018 CLINICAL DATA:  Shortness of breath for several days EXAM: CT  ANGIOGRAPHY CHEST WITH CONTRAST TECHNIQUE: Multidetector CT imaging of the chest was performed using the  standard protocol during bolus administration of intravenous contrast. Multiplanar CT image reconstructions and MIPs were obtained to evaluate the vascular anatomy. CONTRAST:  15m ISOVUE-370 IOPAMIDOL (ISOVUE-370) INJECTION 76% COMPARISON:  Chest x-ray from earlier in the same day. FINDINGS: Cardiovascular: Thoracic aorta shows no aneurysmal dilatation or dissection. No cardiac enlargement is noted. No significant coronary calcifications are noted. Pulmonary artery demonstrates a normal branching pattern. No filling defects to suggest pulmonary emboli are identified. Mediastinum/Nodes: Thoracic inlet is within normal limits. No significant hilar or mediastinal adenopathy is noted. A few small hilar lymph nodes are seen likely reactive in nature. The esophagus is within normal limits. Lungs/Pleura: Lungs are well aerated bilaterally. Patchy alveolar and interstitial changes are identified bilaterally consistent with acute infiltrate. Portion of this may be related to some interstitial edema. No sizable effusion is noted. No pneumothorax is seen. Upper Abdomen: Visualized upper abdomen demonstrates a geographic area of decreased attenuation within the left lobe of the liver. This measures negative Hounsfield units consistent with fluid although the overall appearance is not suggestive of a cyst. Musculoskeletal: No chest wall abnormality. No acute or significant osseous findings. Review of the MIP images confirms the above findings. IMPRESSION: Patchy alveolar and interstitial infiltrates bilaterally consistent with acute pneumonic process and possible interstitial edema. No evidence of pulmonary emboli. Area of decreased attenuation within the left lobe of the liver. This measures as cystic in appearance although the geographic nature is somewhat unusual. Ultrasound of the abdomen is recommended for further  evaluation. Electronically Signed   By: MInez CatalinaM.D.   On: 05/14/2018 13:54    Pending Labs Unresulted Labs (From admission, onward)    Start     Ordered   05/21/18 0500  Creatinine, serum  (enoxaparin (LOVENOX)    CrCl >/= 30 ml/min)  Weekly,   R    Comments:  while on enoxaparin therapy    05/14/18 1431   05/15/18 0500  Vitamin B12  (Anemia Panel (PNL))  Tomorrow morning,   R     05/14/18 1412   05/15/18 0500  Folate  (Anemia Panel (PNL))  Tomorrow morning,   R     05/14/18 1412   05/14/18 1431  Respiratory Panel by PCR  (Respiratory virus panel)  Once,   R     05/14/18 1431   05/14/18 1430  Culture, blood (routine x 2) Call MD if unable to obtain prior to antibiotics being given  BLOOD CULTURE X 2,   R    Comments:  If blood cultures drawn in Emergency Department - Do not draw and cancel order    05/14/18 1431   05/14/18 1430  Culture, sputum-assessment  Once,   R     05/14/18 1431   05/14/18 1430  Gram stain  Once,   R     05/14/18 1431   05/14/18 1430  HIV antibody (Routine Screening)  Once,   R     05/14/18 1431   05/14/18 1430  Strep pneumoniae urinary antigen  Once,   R     05/14/18 1431   05/14/18 1430  CBC  (enoxaparin (LOVENOX)    CrCl >/= 30 ml/min)  Once,   R    Comments:  Baseline for enoxaparin therapy IF NOT ALREADY DRAWN.  Notify MD if PLT < 100 K.    05/14/18 1431   05/14/18 1430  Creatinine, serum  (enoxaparin (LOVENOX)    CrCl >/= 30 ml/min)  Once,   R    Comments:  Baseline  for enoxaparin therapy IF NOT ALREADY DRAWN.    05/14/18 1431   05/14/18 1413  Iron and TIBC  (Anemia Panel (PNL))  Once,   R     05/14/18 1412   05/14/18 1413  Ferritin  (Anemia Panel (PNL))  Once,   R     05/14/18 1412   05/14/18 1413  Reticulocytes  (Anemia Panel (PNL))  Once,   R     05/14/18 1412   05/14/18 1047  Blood Culture (routine x 2)  BLOOD CULTURE X 2,   STAT     05/14/18 1047          Vitals/Pain Today's Vitals   05/14/18 1332 05/14/18 1342 05/14/18 1345  05/14/18 1400  BP: 118/72  111/69 110/74  Pulse: 99  94 95  Resp:  16  16  Temp:      TempSrc:      SpO2: 100%  100% 100%    Isolation Precautions Droplet precaution  Medications Medications  potassium chloride 10 mEq in 100 mL IVPB (10 mEq Intravenous New Bag/Given 05/14/18 1444)  iopamidol (ISOVUE-370) 76 % injection (has no administration in time range)  potassium chloride SA (K-DUR,KLOR-CON) CR tablet 40 mEq (has no administration in time range)  enoxaparin (LOVENOX) injection 40 mg (has no administration in time range)  sodium chloride flush (NS) 0.9 % injection 3 mL (has no administration in time range)  sodium chloride flush (NS) 0.9 % injection 3 mL (has no administration in time range)  0.9 %  sodium chloride infusion (has no administration in time range)  levofloxacin (LEVAQUIN) IVPB 750 mg (has no administration in time range)  sodium chloride 0.9 % bolus 500 mL (0 mLs Intravenous Stopped 05/14/18 1314)  ipratropium-albuterol (DUONEB) 0.5-2.5 (3) MG/3ML nebulizer solution 3 mL (3 mLs Nebulization Given 05/14/18 1445)  acetaminophen (TYLENOL) tablet 1,000 mg (1,000 mg Oral Given 05/14/18 1108)  levofloxacin (LEVAQUIN) IVPB 750 mg (0 mg Intravenous Stopped 05/14/18 1314)  potassium chloride SA (K-DUR,KLOR-CON) CR tablet 40 mEq (40 mEq Oral Given 05/14/18 1339)  iopamidol (ISOVUE-370) 76 % injection 100 mL (100 mLs Intravenous Contrast Given 05/14/18 1317)    Mobility walks

## 2018-05-14 NOTE — ED Notes (Signed)
Bed: WA10 Expected date:  Expected time:  Means of arrival:  Comments: EMS/shob 

## 2018-05-14 NOTE — Progress Notes (Signed)
A consult was received from an ED physician for Levaquin per pharmacy dosing.  The patient's profile has been reviewed for ht/wt/allergies/indication/available labs. No weight, BMET yet available A one time order has been placed for Levaquin 750mg   Further antibiotics/pharmacy consults should be ordered by admitting physician if indicated.                       Thank you,  Charles Morrison, Charles Morrison 05/14/2018  11:51 AM

## 2018-05-14 NOTE — ED Provider Notes (Signed)
Emergency Department Provider Note   I have reviewed the triage vital signs and the nursing notes.   HISTORY  Chief Complaint Shortness of Breath   HPI Charles Morrison is a 37 y.o. male with PMH of drug allergic reaction and h/o asthma as a child presents to the emergency department for evaluation of shortness of breath worsening over the past 4 days.  The patient describes a productive cough with white sputum starting last night.  Denies any hemoptysis.  He states his symptoms are improved by having a fan blow into his face but if he is not having the fan blow on him he feels more short of breath.  He states it is difficult for him to take a deep breath but denies pain.  Is not experienced fevers or shaking chills.  No leg swelling.  She did drive to [place] earlier this month which is approximately 17 hours and returned on August 11th. No leg pain.    History reviewed. No pertinent past medical history.  Patient Active Problem List   Diagnosis Date Noted  . 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 Anemia 05/14/2018  . Pneumonia 05/14/2018  . Allergy to amoxicillin 12/13/2017  . Strep throat 12/13/2017  . Pre-syncope 12/13/2017  . Rash 12/13/2017  . AKI (acute kidney injury) (HCC) 12/13/2017  . Hypokalemia 12/13/2017  . Sepsis (HCC) 12/13/2017  . Allergic reaction 12/13/2017    No past surgical history on file.  Current Outpatient Rx  . Order #: [id number] Class: Historical Med    Allergies Amoxicillin and Shellfish allergy  Family History  Problem Relation Age of Onset  . Arthritis Mother        Knee    Social History Social History   Tobacco Use  . Smoking status: Never Smoker  . Smokeless tobacco: Never Used  Substance Use Topics  . Alcohol use: Yes  . Drug use: No    Review of Systems  Constitutional: No fever/chills Eyes: No visual changes. ENT: No sore throat. Cardiovascular: Denies chest pain. Respiratory: Positive shortness of  breath. Gastrointestinal: No abdominal pain.  No nausea, no vomiting.  No diarrhea.  No constipation. Genitourinary: Negative for dysuria. Musculoskeletal: Negative for back pain. Skin: Negative for rash. Neurological: Negative for headaches, focal weakness or numbness.  10-point ROS otherwise negative.  ____________________________________________   PHYSICAL EXAM:  VITAL SIGNS: ED Triage Vitals [05/14/18 1039]  Enc Vitals Group     BP 115/73     Pulse Rate (!) 110     Resp 20     Temp      Temp src      SpO2 100 %   Constitutional: Alert and oriented. Well appearing and in no acute distress. Eyes: Conjunctivae are normal. Head: Atraumatic. Nose: No congestion/rhinnorhea. Mouth/Throat: Mucous membranes are moist. Neck: No stridor.   Cardiovascular: Normal rate, regular rhythm. Good peripheral circulation. Grossly normal heart sounds.   Respiratory: Normal respiratory effort. No retractions. Lungs CTAB. Gastrointestinal: Soft and nontender. No distention.  Musculoskeletal: No lower extremity tenderness nor edema. No gross deformities of extremities. Neurologic:  Normal speech and language. No gross focal neurologic deficits are appreciated.  Skin:  Skin is warm, dry and intact. No rash noted. ____________________________________________   LABS (all labs ordered are listed, but only abnormal results are displayed)  Labs Reviewed  COMPREHENSIVE METABOLIC PANEL - Abnormal; Notable for the following components:      Result Value   Potassium 2.7 (*)    Glucose, Bld 101 (*)  Calcium 8.3 (*)    Albumin 2.5 (*)    All other components within normal limits  CBC WITH DIFFERENTIAL/PLATELET - Abnormal; Notable for the following components:   Hemoglobin 10.6 (*)    HCT 32.5 (*)    MCV 75.8 (*)    MCH 24.7 (*)    Platelets 546 (*)    All other components within normal limits  CULTURE, BLOOD (ROUTINE X 2)  CULTURE, BLOOD (ROUTINE X 2)  LIPASE, BLOOD  BRAIN NATRIURETIC  PEPTIDE  MAGNESIUM  IRON AND TIBC  FERRITIN  RETICULOCYTES  I-STAT TROPONIN, ED  I-STAT CG4 LACTIC ACID, ED   ____________________________________________  EKG   EKG Interpretation  Date/Time:  Wednesday May 14 2018 10:41:33 EDT Ventricular Rate:  114 PR Interval:    QRS Duration: 77 QT Interval:  334 QTC Calculation: 460 R Axis:   -5 Text Interpretation:  Sinus tachycardia No STEMI.  Confirmed by Alona Bene [phone]) on 05/14/2018 10:48:34 AM       ____________________________________________  RADIOLOGY  Dg Chest 2 View  Result Date: 05/14/2018 CLINICAL DATA:  Chest pain, shortness of breath for days EXAM: CHEST - 2 VIEW COMPARISON:  12/13/2017 FINDINGS: There are bilateral interstitial and alveolar airspace opacities. There is no pleural effusion or pneumothorax. The heart and mediastinal contours are unremarkable. The osseous structures are unremarkable. IMPRESSION: 1. Bilateral interstitial and alveolar airspace opacities concerning for mild pulmonary edema versus multilobar pneumonia. Electronically Signed   By: Elige Ko   On: 05/14/2018 11:07   Ct Angio Chest Pe W And/or Wo Contrast  Result Date: 05/14/2018 CLINICAL DATA:  Shortness of breath for several days EXAM: CT ANGIOGRAPHY CHEST WITH CONTRAST TECHNIQUE: Multidetector CT imaging of the chest was performed using the standard protocol during bolus administration of intravenous contrast. Multiplanar CT image reconstructions and MIPs were obtained to evaluate the vascular anatomy. CONTRAST:  ISOVUE-370 IOPAMIDOL (ISOVUE-370) INJECTION 76% COMPARISON:  Chest x-ray from earlier in the same day. FINDINGS: Cardiovascular: Thoracic aorta shows no aneurysmal dilatation or dissection. No cardiac enlargement is noted. No significant coronary calcifications are noted. Pulmonary artery demonstrates a normal branching pattern. No filling defects to suggest pulmonary emboli are identified. Mediastinum/Nodes: Thoracic inlet  is within normal limits. No significant hilar or mediastinal adenopathy is noted. A few small hilar lymph nodes are seen likely reactive in nature. The esophagus is within normal limits. Lungs/Pleura: Lungs are well aerated bilaterally. Patchy alveolar and interstitial changes are identified bilaterally consistent with acute infiltrate. Portion of this may be related to some interstitial edema. No sizable effusion is noted. No pneumothorax is seen. Upper Abdomen: Visualized upper abdomen demonstrates a geographic area of decreased attenuation within the left lobe of the liver. This measures negative Hounsfield units consistent with fluid although the overall appearance is not suggestive of a cyst. Musculoskeletal: No chest wall abnormality. No acute or significant osseous findings. Review of the MIP images confirms the above findings. IMPRESSION: Patchy alveolar and interstitial infiltrates bilaterally consistent with acute pneumonic process and possible interstitial edema. No evidence of pulmonary emboli. Area of decreased attenuation within the left lobe of the liver. This measures as cystic in appearance although the geographic nature is somewhat unusual. Ultrasound of the abdomen is recommended for further evaluation. Electronically Signed   By: Alcide Clever M.D.   On: 05/14/2018 13:54    ____________________________________________   PROCEDURES  Procedure(s) performed:   .Critical Care Performed by: Maia Plan, MD Authorized by: Maia Plan, MD   Critical  care provider statement:    Critical care time (minutes):  35   Critical care time was exclusive of:  Separately billable procedures and treating other patients and teaching time   Critical care was necessary to treat or prevent imminent or life-threatening deterioration of the following conditions:  Respiratory failure   Critical care was time spent personally by me on the following activities:  Blood draw for specimens, development  of treatment plan with patient or surrogate, evaluation of patient's response to treatment, examination of patient, obtaining history from patient or surrogate, ordering and performing treatments and interventions, ordering and review of radiographic studies, ordering and review of laboratory studies, pulse oximetry, re-evaluation of patient's condition and review of old charts   I assumed direction of critical care for this patient from another provider in my specialty: no      ____________________________________________   INITIAL IMPRESSION / ASSESSMENT AND PLAN / ED COURSE  Pertinent labs & imaging results that were available during my care of the patient were reviewed by me and considered in my medical decision making (see chart for details).  Patient presents to the emergency department with shortness of breath, productive cough, and hypoxemia.  Patient is febrile here.  He does have increased risk factors for DVT/PE given his recent trip to [place] but suspect infection given fever. If CXR is negative, would consider CTA of the chest for further evaluation.   CXR non-specific with edema vs multilobar PNA. Labs unremarkable. Plan for CTA for further characterization of infiltrates and to r/o PE which can occasionally present with fever and non-specific CXR.   02:20 PM CTA negative for PE. Plan to continue abx and potassium supplementation. Patient on 4L O2.   Discussed patient's case with Hospitalist, Dr. Butler Denmarkizwan to request admission. Patient and family (if present) updated with plan. [facility].  I reviewed all nursing notes, vitals, pertinent old records, EKGs, labs, imaging (as available).  ____________________________________________  FINAL CLINICAL IMPRESSION(S) / ED DIAGNOSES  Final diagnoses:  Community acquired pneumonia, unspecified laterality  Hypoxemia  Hypokalemia     MEDICATIONS GIVEN DURING THIS VISIT:  Medications   ipratropium-albuterol (DUONEB) 0.5-2.5 (3) MG/3ML nebulizer solution 3 mL (has no administration in time range)  potassium chloride 10 mEq in 100 mL IVPB (10 mEq Intravenous New Bag/Given 05/14/18 1340)  iopamidol (ISOVUE-370) 76 % injection (has no administration in time range)  sodium chloride 0.9 % bolus 500 mL (0 mLs Intravenous Stopped 05/14/18 1314)  acetaminophen (TYLENOL) tablet 1,000 mg (1,000 mg Oral Given 05/14/18 1108)  levofloxacin (LEVAQUIN) IVPB 750 mg (0 mg Intravenous Stopped 05/14/18 1314)  potassium chloride SA (K-DUR,KLOR-CON) CR tablet 40 mEq (40 mEq Oral Given 05/14/18 1339)  iopamidol (ISOVUE-370) 76 % injection 100 mL (100 mLs Intravenous Contrast Given 05/14/18 1317)    Note:  This document was prepared using Dragon voice recognition software and may include unintentional dictation errors.  Alona BeneJoshua Shanyn Preisler, MD Emergency Medicine    Decie Verne, Arlyss RepressJoshua G, MD 05/14/18 [phone]

## 2018-05-14 NOTE — ED Notes (Signed)
Patient transported to CT 

## 2018-05-14 NOTE — H&P (Addendum)
History and Physical    Charles Morrison  ZOX:[id number]  DOB: August 29, 1981  DOA: 05/14/2018 PCP: System, Pcp Not In   Patient coming from: home  Chief Complaint: shortness of breath and cough  HPI: Charles Morrison is a 37 y.o. male with no significant past medical history who became short of breath about 3 days ago and yesterday began to have a cough which is productive of clear sputum. He states he coughed so much that he ended up vomiting. He has otherwise not had any further vomiting. He was more short of breath this AM and decided to come to the ED. No complaint of sore throat, sinus issues or chest pain. He is noted to have a mild fever in the ED and was hypoxic.  He is noted to be anemic. No h/o of acute or chronic blood loss. He does not eat much red meat. He never has been anemic before.     ED Course:  Fever 100.7 Pulse ox 100 on 4 L K 2.7-   CXR > Bilateral interstitial and alveolar airspace opacities concerning for mild pulmonary edema versus multilobar pneumonia. CT chest with contrast > Patchy alveolar and interstitial infiltrates bilaterally consistent with acute pneumonic process and possible interstitial edema.  Review of Systems:  Has lost about 20-30 lbs in the past two months with diet and exercise All other systems reviewed and apart from HPI, are negative.  Past Medical History:  Diagnosis Date  . Medical history non-contributory     Past Surgical History:  Procedure Laterality Date  . NO PAST SURGERIES      Social History:  Reports that he has never smoked. He has never used smokeless tobacco. He reports that he drinks alcohol occasiobnally. He reports that he does not use drugs. Works for the [employer] of [employer]  Allergies  Allergen Reactions  . Amoxicillin Rash    Also has mild lip and facial swelling  . Shellfish Allergy Rash    Also had mild lip and facial swelling    Family History  Problem Relation Age of Onset  . Arthritis  Mother        Knee     Prior to Admission medications   Medication Sig Start Date End Date Taking? Authorizing Provider  Multiple Vitamins-Minerals (MULTIVITAMIN ADULT) CHEW Chew 1 tablet by mouth 2 (two) times daily.   Yes [provider]    Physical Exam: Wt Readings from Last 3 Encounters:  12/14/17 107.1 kg   Vitals:   05/14/18 1332 05/14/18 1342 05/14/18 1345 05/14/18 1400  BP: 118/72  111/69 110/74  Pulse: 99  94 95  Resp:  16  16  Temp:      TempSrc:      SpO2: 100%  100% 100%      Constitutional:  Calm & comfortable Eyes: PERRLA, lids and conjunctivae normal ENT:  Mucous membranes are moist.  Pharynx clear of exudate   Normal dentition.  Neck: Supple, no masses  Respiratory:  Not able to take a full deep breath. Faint crackles in RLL.  Cardiovascular:  S1 & S2 heard, regular rate and rhythm No Murmurs Abdomen:  Non distended No tenderness, No masses Bowel sounds normal Extremities:  No clubbing / cyanosis No pedal edema No joint deformity    Skin:  No rashes, lesions or ulcers Neurologic:  AAO x 3 CN 2-12 grossly intact Sensation intact Strength 5/5 in all 4 extremities Psychiatric:  Normal Mood and affect    Labs on  Admission: I have personally reviewed following labs and imaging studies  CBC: Recent Labs  Lab 05/14/18 1105  WBC 7.9  NEUTROABS 6.0  HGB 10.6*  HCT 32.5*  MCV 75.8*  PLT 546*   Basic Metabolic Panel: Recent Labs  Lab 05/14/18 1105  NA 141  K 2.7*  CL 101  CO2 26  GLUCOSE 101*  BUN 9  CREATININE 0.82  CALCIUM 8.3*  MG 2.2   GFR: CrCl cannot be calculated (Unknown ideal weight.). Liver Function Tests: Recent Labs  Lab 05/14/18 1105  AST 40  ALT 20  ALKPHOS 49  BILITOT 0.8  PROT 7.9  ALBUMIN 2.5*   Recent Labs  Lab 05/14/18 1105  LIPASE 22   No results for input(s): AMMONIA in the last 168 hours. Coagulation Profile: No results for input(s): INR, PROTIME in the last 168  hours. Cardiac Enzymes: No results for input(s): CKTOTAL, CKMB, CKMBINDEX, TROPONINI in the last 168 hours. BNP (last 3 results) No results for input(s): PROBNP in the last 8760 hours. HbA1C: No results for input(s): HGBA1C in the last 72 hours. CBG: No results for input(s): GLUCAP in the last 168 hours. Lipid Profile: No results for input(s): CHOL, HDL, LDLCALC, TRIG, CHOLHDL, LDLDIRECT in the last 72 hours. Thyroid Function Tests: No results for input(s): TSH, T4TOTAL, FREET4, T3FREE, THYROIDAB in the last 72 hours. Anemia Panel: No results for input(s): VITAMINB12, FOLATE, FERRITIN, TIBC, IRON, RETICCTPCT in the last 72 hours. Urine analysis:    Component Value Date/Time   COLORURINE YELLOW 12/14/2017 0505   APPEARANCEUR CLEAR 12/14/2017 0505   LABSPEC 1.017 12/14/2017 0505   PHURINE 6.0 12/14/2017 0505   GLUCOSEU NEGATIVE 12/14/2017 0505   HGBUR SMALL (A) 12/14/2017 0505   BILIRUBINUR NEGATIVE 12/14/2017 0505   KETONESUR 20 (A) 12/14/2017 0505   PROTEINUR 30 (A) 12/14/2017 0505   NITRITE NEGATIVE 12/14/2017 0505   LEUKOCYTESUR NEGATIVE 12/14/2017 0505   Sepsis Labs: @LABRCNTIP (procalcitonin:4,lacticidven:4) )No results found for this or any previous visit (from the past 240 hour(s)).   Radiological Exams on Admission: Dg Chest 2 View  Result Date: 05/14/2018 CLINICAL DATA:  Chest pain, shortness of breath for days EXAM: CHEST - 2 VIEW COMPARISON:  12/13/2017 FINDINGS: There are bilateral interstitial and alveolar airspace opacities. There is no pleural effusion or pneumothorax. The heart and mediastinal contours are unremarkable. The osseous structures are unremarkable. IMPRESSION: 1. Bilateral interstitial and alveolar airspace opacities concerning for mild pulmonary edema versus multilobar pneumonia. Electronically Signed   By: Elige KoHetal  Patel   On: 05/14/2018 11:07   Ct Angio Chest Pe W And/or Wo Contrast  Result Date: 05/14/2018 CLINICAL DATA:  Shortness of breath for  several days EXAM: CT ANGIOGRAPHY CHEST WITH CONTRAST TECHNIQUE: Multidetector CT imaging of the chest was performed using the standard protocol during bolus administration of intravenous contrast. Multiplanar CT image reconstructions and MIPs were obtained to evaluate the vascular anatomy. CONTRAST:  100mL ISOVUE-370 IOPAMIDOL (ISOVUE-370) INJECTION 76% COMPARISON:  Chest x-ray from earlier in the same day. FINDINGS: Cardiovascular: Thoracic aorta shows no aneurysmal dilatation or dissection. No cardiac enlargement is noted. No significant coronary calcifications are noted. Pulmonary artery demonstrates a normal branching pattern. No filling defects to suggest pulmonary emboli are identified. Mediastinum/Nodes: Thoracic inlet is within normal limits. No significant hilar or mediastinal adenopathy is noted. A few small hilar lymph nodes are seen likely reactive in nature. The esophagus is within normal limits. Lungs/Pleura: Lungs are well aerated bilaterally. Patchy alveolar and interstitial changes are identified bilaterally consistent  with acute infiltrate. Portion of this may be related to some interstitial edema. No sizable effusion is noted. No pneumothorax is seen. Upper Abdomen: Visualized upper abdomen demonstrates a geographic area of decreased attenuation within the left lobe of the liver. This measures negative Hounsfield units consistent with fluid although the overall appearance is not suggestive of a cyst. Musculoskeletal: No chest wall abnormality. No acute or significant osseous findings. Review of the MIP images confirms the above findings. IMPRESSION: Patchy alveolar and interstitial infiltrates bilaterally consistent with acute pneumonic process and possible interstitial edema. No evidence of pulmonary emboli. Area of decreased attenuation within the left lobe of the liver. This measures as cystic in appearance although the geographic nature is somewhat unusual. Ultrasound of the abdomen is  recommended for further evaluation. Electronically Signed   By: Alcide CleverMark  Lukens M.D.   On: 05/14/2018 13:54    EKG: Independently reviewed. Sinus tachycardia  Assessment/Plan Principal Problem:  CAP (community acquired pneumonia)- bilateral   Acute hypoxemic respiratory failure  Sepsis - Levaquin given in the ED due to severe penicillin allergy this past spring when he was treated for strep pharyngitis - check resp virus panel and strep pneumo antigen - cont O2 and wean as able- he is currently on 4 L today  Active Problems:   Hypokalemia - has received 40 meq in the ED, will give one more dose and recheck tomorrow - Mg normal    Anemia - new finding- will check anemia panel and stool occults- I have asked him to find a PCP as well      Liver mass, left lobe - area of decreased attenuation within the left lobe of the liver - ultrasound recommended- I have discussed the finding with the patient and he would like to have the ultrasound done in the hospital as he does not have a PCP  DVT prophylaxis: Lovenox  Code Status: Full code  Family Communication:   Disposition Plan:  - admitting to med/surg  Consults called: none  Admission status: inpatient    Calvert CantorSaima Satish Hammers MD [facility] Pager: [email or web address] Password [email or web address] 7PM-7AM, please contact night-coverage   05/14/2018, 2:49 PM

## 2018-05-14 NOTE — ED Notes (Signed)
Date and time results received: 05/14/18 1150 (use smartphrase ".now" to insert current time)  Test: K+ Critical Value: 2.7 Name of Provider Notified: Dr. Jacqulyn BathLong  Orders Received? Or Actions Taken?:

## 2018-05-14 NOTE — ED Notes (Signed)
US now at bedside with patient, transport delayed, 6E made aware.

## 2018-05-14 NOTE — ED Notes (Signed)
Patient transported to X-ray 

## 2018-05-15 DIAGNOSIS — A419 Sepsis, unspecified organism: Secondary | ICD-10-CM

## 2018-05-15 DIAGNOSIS — R7881 Bacteremia: Secondary | ICD-10-CM

## 2018-05-15 DIAGNOSIS — E876 Hypokalemia: Secondary | ICD-10-CM

## 2018-05-15 DIAGNOSIS — Z21 Asymptomatic human immunodeficiency virus [HIV] infection status: Secondary | ICD-10-CM

## 2018-05-15 DIAGNOSIS — B20 Human immunodeficiency virus [HIV] disease: Principal | ICD-10-CM

## 2018-05-15 DIAGNOSIS — J9601 Acute respiratory failure with hypoxia: Secondary | ICD-10-CM

## 2018-05-15 DIAGNOSIS — J189 Pneumonia, unspecified organism: Secondary | ICD-10-CM

## 2018-05-15 DIAGNOSIS — R160 Hepatomegaly, not elsewhere classified: Secondary | ICD-10-CM

## 2018-05-15 DIAGNOSIS — Z881 Allergy status to other antibiotic agents status: Secondary | ICD-10-CM

## 2018-05-15 DIAGNOSIS — Z91013 Allergy to seafood: Secondary | ICD-10-CM

## 2018-05-15 LAB — BASIC METABOLIC PANEL
Anion gap: 8 (ref 5–15)
BUN: 6 mg/dL (ref 6–20)
CALCIUM: 8.3 mg/dL — AB (ref 8.9–10.3)
CO2: 28 mmol/L (ref 22–32)
Chloride: 107 mmol/L (ref 98–111)
Creatinine, Ser: 0.8 mg/dL (ref 0.61–1.24)
GFR calc Af Amer: >60 mL/min (ref >60)
GLUCOSE: 106 mg/dL — AB (ref 70–99)
POTASSIUM: 3.4 mmol/L — AB (ref 3.5–5.1)
Sodium: 143 mmol/L (ref 135–145)

## 2018-05-15 LAB — RESPIRATORY PANEL BY PCR
ADENOVIRUS-RVPPCR: NOT DETECTED
BORDETELLA PERTUSSIS-RVPCR: NOT DETECTED
CHLAMYDOPHILA PNEUMONIAE-RVPPCR: NOT DETECTED
Coronavirus 229E: NOT DETECTED
Coronavirus HKU1: NOT DETECTED
Coronavirus NL63: NOT DETECTED
Coronavirus OC43: NOT DETECTED
INFLUENZA A-RVPPCR: NOT DETECTED
INFLUENZA B-RVPPCR: NOT DETECTED
MYCOPLASMA PNEUMONIAE-RVPPCR: NOT DETECTED
Metapneumovirus: NOT DETECTED
PARAINFLUENZA VIRUS 3-RVPPCR: NOT DETECTED
PARAINFLUENZA VIRUS 4-RVPPCR: NOT DETECTED
Parainfluenza Virus 1: NOT DETECTED
Parainfluenza Virus 2: NOT DETECTED
RHINOVIRUS / ENTEROVIRUS - RVPPCR: NOT DETECTED
Respiratory Syncytial Virus: NOT DETECTED

## 2018-05-15 LAB — CBC
HEMATOCRIT: 30.7 % — AB (ref 39.0–52.0)
Hemoglobin: 9.8 g/dL — ABNORMAL LOW (ref 13.0–17.0)
MCH: 24.4 pg — ABNORMAL LOW (ref 26.0–34.0)
MCHC: 31.9 g/dL (ref 30.0–36.0)
MCV: 76.4 fL — ABNORMAL LOW (ref 78.0–100.0)
Platelets: 528 10*3/uL — ABNORMAL HIGH (ref 150–400)
RBC: 4.02 MIL/uL — ABNORMAL LOW (ref 4.22–5.81)
RDW: 15 % (ref 11.5–15.5)
WBC: 7.7 10*3/uL (ref 4.0–10.5)

## 2018-05-15 LAB — BLOOD CULTURE ID PANEL (REFLEXED)
ACINETOBACTER BAUMANNII: NOT DETECTED
CANDIDA PARAPSILOSIS: NOT DETECTED
CANDIDA TROPICALIS: NOT DETECTED
Candida albicans: NOT DETECTED
Candida glabrata: NOT DETECTED
Candida krusei: NOT DETECTED
Enterobacter cloacae complex: NOT DETECTED
Enterobacteriaceae species: NOT DETECTED
Enterococcus species: NOT DETECTED
Escherichia coli: NOT DETECTED
HAEMOPHILUS INFLUENZAE: NOT DETECTED
KLEBSIELLA OXYTOCA: NOT DETECTED
Klebsiella pneumoniae: NOT DETECTED
Listeria monocytogenes: NOT DETECTED
METHICILLIN RESISTANCE: DETECTED — AB
Neisseria meningitidis: NOT DETECTED
PSEUDOMONAS AERUGINOSA: NOT DETECTED
Proteus species: NOT DETECTED
STAPHYLOCOCCUS AUREUS BCID: NOT DETECTED
Serratia marcescens: NOT DETECTED
Staphylococcus species: DETECTED — AB
Streptococcus agalactiae: NOT DETECTED
Streptococcus pneumoniae: NOT DETECTED
Streptococcus pyogenes: NOT DETECTED
Streptococcus species: NOT DETECTED

## 2018-05-15 LAB — FOLATE: FOLATE: 13.6 ng/mL (ref >5.9)

## 2018-05-15 LAB — VITAMIN B12: VITAMIN B 12: 381 pg/mL (ref 180–914)

## 2018-05-15 MED ORDER — ACETAMINOPHEN 325 MG PO TABS
650.0000 mg | ORAL_TABLET | ORAL | Status: DC | PRN
  Administered 2018-05-15 – 2018-05-17 (×2): 650 mg via ORAL
  Filled 2018-05-15 (×2): qty 2

## 2018-05-15 MED ORDER — POTASSIUM CHLORIDE 10 MEQ/100ML IV SOLN
10.0000 meq | Freq: Once | INTRAVENOUS | Status: AC
  Administered 2018-05-15: 10 meq via INTRAVENOUS
  Filled 2018-05-15: qty 100

## 2018-05-15 MED ORDER — GUAIFENESIN-DM 100-10 MG/5ML PO SYRP
5.0000 mL | ORAL_SOLUTION | ORAL | Status: DC | PRN
  Filled 2018-05-15: qty 10

## 2018-05-15 MED ORDER — SODIUM CHLORIDE 0.9 % IV BOLUS
500.0000 mL | Freq: Once | INTRAVENOUS | Status: AC
  Administered 2018-05-15: 500 mL via INTRAVENOUS

## 2018-05-15 MED ORDER — POTASSIUM CHLORIDE CRYS ER 20 MEQ PO TBCR
40.0000 meq | EXTENDED_RELEASE_TABLET | Freq: Once | ORAL | Status: AC
  Filled 2018-05-15: qty 2

## 2018-05-15 MED ORDER — SULFAMETHOXAZOLE-TRIMETHOPRIM 400-80 MG/5ML IV SOLN
480.0000 mg | Freq: Three times a day (TID) | INTRAVENOUS | Status: DC
  Administered 2018-05-15 – 2018-05-17 (×6): 480 mg via INTRAVENOUS
  Filled 2018-05-15 (×8): qty 30

## 2018-05-15 NOTE — Consult Note (Signed)
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 male with PMH of childhood asthma, and recently strep throat but then developed diffuse EM rash thought to be due to amoxicillin -briefly hospitalized in March 2019. At that time, he was tested for HIV which was positive which resulted after he was discharge and was essentially lost to follow up since state dept did not contact him. He now is admitted for 4 day hx of SOB, with a productive cough with white sputum starting last night.  Denies any hemoptysis. He states it is difficult for him to take a deep breath but denies pain. denies fever, chills ,nightsweats. In the ED, found to have fevers up to 180f cxr showing bilateral interstitial infiltrates. Chest CTA ruled out PE but showing interstitial pattern concerning for atypical pneumonia. Patient was started on levofloxacin. Also disclosed HIV + test which he is upset.   He states that he was [place] ( [place] [employer]) He has been with the same partner for 4 years-has not stepped out of his relationship. No hx of STI or syphilis. He is usually insertive partner. Routinely gets tested Q 6-12 months. He is under the belief that his partner is hiv negative.  He works full time for [place] [employer]   Past Medical History:  Diagnosis Date  . Medical history non-contributory     Allergies:  Allergies  Allergen Reactions  . Amoxicillin Rash    Also has mild lip and facial swelling  . Shellfish Allergy Rash    Also had mild lip  and facial swelling     MEDICATIONS: . enoxaparin (LOVENOX) injection  40 mg Subcutaneous Q24H  . potassium chloride  40 mEq Oral Once  . sodium chloride flush  3 mL Intravenous Q12H    Social History   Tobacco Use  . Smoking status: Never Smoker  . Smokeless tobacco: Never Used  Substance Use Topics  . Alcohol use: Yes  . Drug use: No    Family History  Problem Relation Age of Onset  . Arthritis Mother        Knee    Review of Systems  Constitutional: positive for fever, chills, diaphoresis, activity change, appetite change, fatigue and unexpected weight change.  HENT: Negative for congestion, sore throat, rhinorrhea, sneezing, trouble swallowing and sinus pressure.  Eyes: Negative for photophobia and visual disturbance.  Respiratory: positive for cough, chest tightness, shortness of breath, wheezing and stridor.  Cardiovascular: Negative for chest pain, palpitations and leg swelling.  Gastrointestinal: Negative for nausea, vomiting, abdominal pain, diarrhea, constipation, blood in stool, abdominal distention and anal bleeding.  Genitourinary: Negative for dysuria, hematuria, flank pain and difficulty urinating.  Musculoskeletal: Negative for myalgias, back pain, joint swelling, arthralgias and gait problem.  Skin: Negative for color change, pallor, rash and wound.  Neurological: Negative for dizziness, tremors, weakness and light-headedness.  Hematological: Negative for adenopathy. Does not bruise/bleed easily.  Psychiatric/Behavioral:  Negative for behavioral problems, confusion, sleep disturbance, dysphoric mood, decreased concentration and agitation.     OBJECTIVE: Temp:  [98.3 F (36.8 C)-100.9 F (38.3 C)] 99.6 F (37.6 C) (08/22 0452) Pulse Rate:  [89-118] 118 (08/22 0304) Resp:  [16-18] 18 (08/22 0304) BP: (110-144)/(67-83) 118/67 (08/22 0304) SpO2:  [91 %-100 %] 98 % (08/22 0304) Weight:  [90.7 kg] 90.7 kg (08/21 1810) Physical Exam  Constitutional: He is  oriented to person, place, and time. He appears diaphoretic, well-developed and well-nourished. No distress.  HENT: injected conjunctiva bilaterally Mouth/Throat: Oropharynx is clear and moist. No oropharyngeal exudate.  Cardiovascular: Normal rate, regular rhythm and normal heart sounds. Exam reveals no gallop and no friction rub.  No murmur heard.  Pulmonary/Chest: Effort normal and breath sounds normal. No respiratory distress. He has no wheezes.  Abdominal: Soft. Bowel sounds are normal. He exhibits no distension. There is no tenderness.  Lymphadenopathy:  He has no cervical adenopathy.  Neurological: He is alert and oriented to person, place, and time.  Skin: Skin is warm and dry. No rash noted. No erythema.  Psychiatric: He has a normal mood and affect. His behavior is normal.     LABS: Results for orders placed or performed during the hospital encounter of 05/14/18 (from the past 48 hour(s))  Comprehensive metabolic panel     Status: Abnormal   Collection Time: 05/14/18 11:05 AM  Result Value Ref Range   Sodium 141 135 - 145 mmol/L   Potassium 2.7 (LL) 3.5 - 5.1 mmol/L    Comment: CRITICAL RESULT CALLED TO, READ BACK BY AND VERIFIED WITH: T SMITH,RN 05/14/18 1150 RHOLMES    Chloride 101 98 - 111 mmol/L   CO2 26 22 - 32 mmol/L   Glucose, Bld 101 (H) 70 - 99 mg/dL   BUN 9 6 - 20 mg/dL   Creatinine, Ser 0.82 0.61 - 1.24 mg/dL   Calcium 8.3 (L) 8.9 - 10.3 mg/dL   Total Protein 7.9 6.5 - 8.1 g/dL   Albumin 2.5 (L) 3.5 - 5.0 g/dL   AST 40 15 - 41 U/L   ALT 20 0 - 44 U/L   Alkaline Phosphatase 49 38 - 126 U/L   Total Bilirubin 0.8 0.3 - 1.2 mg/dL   GFR calc non Af Amer >60 >60 mL/min   GFR calc Af Amer >60 >60 mL/min    Comment: (NOTE) The eGFR has been calculated using the CKD EPI equation. This calculation has not been validated in all clinical situations. eGFR's persistently <60 mL/min signify possible Chronic Kidney Disease.    Anion gap 14 5 - 15    Comment:  Performed at [facility], [place] [address] [place], [place] [address]  Lipase, blood     Status: None   Collection Time: 05/14/18 11:05 AM  Result Value Ref Range   Lipase 22 11 - 51 U/L    Comment: Performed at [facility], [place] [address] [place], [place] [address]  Brain natriuretic peptide     Status: None   Collection Time: 05/14/18 11:05 AM  Result Value Ref Range   B Natriuretic Peptide 14.5 0.0 - 100.0 pg/mL    Comment: Performed at [facility], [place] [address], [place], [place] [address]  CBC with Differential     Status: Abnormal   Collection Time: 05/14/18 11:05 AM  Result Value Ref Range   WBC 7.9 4.0 - 10.5 K/uL   RBC 4.29 4.22 - 5.81 MIL/uL  Hemoglobin 10.6 (L) 13.0 - 17.0 g/dL   HCT 32.5 (L) 39.0 - 52.0 %   MCV 75.8 (L) 78.0 - 100.0 fL   MCH 24.7 (L) 26.0 - 34.0 pg   MCHC 32.6 30.0 - 36.0 g/dL   RDW 14.9 11.5 - 15.5 %   Platelets 546 (H) 150 - 400 K/uL   Neutrophils Relative % 76 %   Neutro Abs 6.0 1.7 - 7.7 K/uL   Lymphocytes Relative 16 %   Lymphs Abs 1.2 0.7 - 4.0 K/uL   Monocytes Relative 8 %   Monocytes Absolute 0.6 0.1 - 1.0 K/uL   Eosinophils Relative 0 %   Eosinophils Absolute 0.0 0.0 - 0.7 K/uL   Basophils Relative 0 %   Basophils Absolute 0.0 0.0 - 0.1 K/uL    Comment: Performed at [facility], [place] [address] [place], [place] [address]  Magnesium     Status: None   Collection Time: 05/14/18 11:05 AM  Result Value Ref Range   Magnesium 2.2 1.7 - 2.4 mg/dL    Comment: Performed at [facility], [place] [address], [place], [place] [address]  Blood Culture (routine x 2)     Status: None (Preliminary result)   Collection Time: 05/14/18 11:07 AM  Result Value Ref Range   Specimen Description      BLOOD LEFT ANTECUBITAL Performed at [place] [address], [place], [place] [address]    Special Requests      BOTTLES DRAWN AEROBIC AND  ANAEROBIC Blood Culture adequate volume Performed at [place] [address], [place], [place] [address]    Culture      NO GROWTH < 24 HOURS Performed at [place] [address], [place], [place] [address]    Report Status PENDING   Blood Culture (routine x 2)     Status: None (Preliminary result)   Collection Time: 05/14/18 11:07 AM  Result Value Ref Range   Specimen Description      BLOOD RIGHT ANTECUBITAL Performed at [place] [address], [place], [place] [address]    Special Requests      BOTTLES DRAWN AEROBIC AND ANAEROBIC Blood Culture adequate volume Performed at [place] [address] [place], [place] [address]    Culture  Setup Time      GRAM POSITIVE COCCI IN CLUSTERS AEROBIC BOTTLE ONLY CRITICAL RESULT CALLED TO, READ BACK BY AND VERIFIED WITH: Marlowe Sax [id number] AT 1208 BY CM Performed at [facility], [place] [address] [place],  [address]    Culture GRAM POSITIVE COCCI    Report Status PENDING   Blood Culture ID Panel (Reflexed)     Status: Abnormal   Collection Time: 05/14/18 11:07 AM  Result Value Ref Range   Enterococcus species NOT DETECTED NOT DETECTED   Listeria monocytogenes NOT DETECTED NOT DETECTED   Staphylococcus species DETECTED (A) NOT DETECTED    Comment: Methicillin (oxacillin) resistant coagulase negative staphylococcus. Possible blood culture contaminant (unless isolated from more than one blood culture draw or clinical case suggests pathogenicity). No antibiotic treatment is indicated for blood  culture contaminants. CRITICAL RESULT CALLED TO, READ BACK BY AND VERIFIED WITH: PHARMD C BARMAN [id number] AT 1208 BY CM    Staphylococcus aureus NOT DETECTED NOT DETECTED   Methicillin resistance DETECTED (A) NOT DETECTED    Comment: CRITICAL RESULT CALLED TO, READ BACK BY AND VERIFIED WITH: PHARMD C  BARMAN 0822/19 AT 1208 BY CM    Streptococcus  species NOT DETECTED NOT DETECTED   Streptococcus agalactiae NOT DETECTED NOT DETECTED   Streptococcus pneumoniae NOT DETECTED NOT DETECTED   Streptococcus pyogenes NOT DETECTED NOT DETECTED   Acinetobacter baumannii NOT DETECTED NOT DETECTED   Enterobacteriaceae species NOT DETECTED NOT DETECTED   Enterobacter cloacae complex NOT DETECTED NOT DETECTED   Escherichia coli NOT DETECTED NOT DETECTED   Klebsiella oxytoca NOT DETECTED NOT DETECTED   Klebsiella pneumoniae NOT DETECTED NOT DETECTED   Proteus species NOT DETECTED NOT DETECTED   Serratia marcescens NOT DETECTED NOT DETECTED   Haemophilus influenzae NOT DETECTED NOT DETECTED   Neisseria meningitidis NOT DETECTED NOT DETECTED   Pseudomonas aeruginosa NOT DETECTED NOT DETECTED   Candida albicans NOT DETECTED NOT DETECTED   Candida glabrata NOT DETECTED NOT DETECTED   Candida krusei NOT DETECTED NOT DETECTED   Candida parapsilosis NOT DETECTED NOT DETECTED   Candida tropicalis NOT DETECTED NOT DETECTED    Comment: Performed at [facility], [place] [address] [place], [place] [address]  I-stat troponin, ED     Status: None   Collection Time: 05/14/18 11:08 AM  Result Value Ref Range   Troponin i, poc 0.00 0.00 - 0.08 ng/mL   Comment 3            Comment: Due to the release kinetics of cTnI, a negative result within the first hours of the onset of symptoms does not rule out myocardial infarction with certainty. If myocardial infarction is still suspected, repeat the test at appropriate intervals.   I-Stat CG4 Lactic Acid, ED  (not at  [facility])     Status: None   Collection Time: 05/14/18 11:09 AM  Result Value Ref Range   Lactic Acid, Venous 1.27 0.5 - 1.9 mmol/L  Iron and TIBC     Status: Abnormal   Collection Time: 05/14/18  6:00 PM  Result Value Ref Range   Iron 15 (L) 45 - 182 ug/dL   TIBC 171 (L) 250 - 450 ug/dL   Saturation Ratios 9 (L) 17.9 - 39.5 %   UIBC 156 ug/dL    Comment: Performed at [facility], [place] [address], [place], [place] [address]  Ferritin     Status: None   Collection Time: 05/14/18  6:00 PM  Result Value Ref Range   Ferritin 232 24 - 336 ng/mL    Comment: Performed at [facility], [place] [address], [place], [place] [address]  Reticulocytes     Status: Abnormal   Collection Time: 05/14/18  6:00 PM  Result Value Ref Range   Retic Ct Pct 0.6 0.4 - 3.1 %   RBC. 3.93 (L) 4.22 - 5.81 MIL/uL   Retic Count, Absolute 23.6 19.0 - 186.0 K/uL    Comment: Performed at [facility], [place] [address] [place], [place] [address]  Strep pneumoniae urinary antigen     Status: None   Collection Time: 05/14/18  7:09 PM  Result Value Ref Range   Strep Pneumo Urinary Antigen NEGATIVE NEGATIVE    Comment:        Infection due to S. pneumoniae cannot be absolutely ruled out since the antigen present may be below the detection limit of the test.   Respiratory Panel by PCR     Status: None   Collection Time: 05/15/18  2:56 AM  Result Value Ref Range   Adenovirus NOT DETECTED NOT DETECTED  Coronavirus 229E NOT DETECTED NOT DETECTED   Coronavirus HKU1 NOT DETECTED NOT DETECTED   Coronavirus NL63 NOT DETECTED NOT DETECTED   Coronavirus OC43 NOT DETECTED NOT DETECTED   Metapneumovirus NOT DETECTED NOT DETECTED   Rhinovirus / Enterovirus NOT DETECTED NOT DETECTED   Influenza A NOT DETECTED NOT DETECTED   Influenza B NOT DETECTED NOT DETECTED   Parainfluenza Virus 1 NOT DETECTED NOT DETECTED   Parainfluenza Virus 2 NOT DETECTED NOT DETECTED   Parainfluenza Virus 3 NOT DETECTED NOT DETECTED   Parainfluenza Virus 4 NOT DETECTED NOT DETECTED   Respiratory Syncytial Virus NOT DETECTED NOT DETECTED   Bordetella pertussis NOT DETECTED NOT DETECTED   Chlamydophila pneumoniae NOT DETECTED NOT DETECTED   Mycoplasma pneumoniae NOT DETECTED NOT DETECTED    Comment: Performed at [facility], [place] [address] [place], [place] [address]  Vitamin  B12     Status: None   Collection Time: 05/15/18  4:09 AM  Result Value Ref Range   Vitamin B-12 381 180 - 914 pg/mL    Comment: (NOTE) This assay is not validated for testing neonatal or myeloproliferative syndrome specimens for Vitamin B12 levels. Performed at [facility], [place] [address], [place], [place] [address]   Folate     Status: None   Collection Time: 05/15/18  4:09 AM  Result Value Ref Range   Folate 13.6 >5.9 ng/mL    Comment: Performed at [facility], [place] [address] [place], [place] [address]  Basic metabolic panel     Status: Abnormal   Collection Time: 05/15/18  8:35 AM  Result Value Ref Range   Sodium 143 135 - 145 mmol/L   Potassium 3.4 (L) 3.5 - 5.1 mmol/L    Comment: DELTA CHECK NOTED NO VISIBLE HEMOLYSIS    Chloride 107 98 - 111 mmol/L   CO2 28 22 - 32 mmol/L   Glucose, Bld 106 (H) 70 - 99 mg/dL   BUN 6 6 - 20 mg/dL   Creatinine, Ser 0.80 0.61 - 1.24 mg/dL   Calcium 8.3 (L) 8.9 - 10.3 mg/dL   GFR calc non Af Amer >60 >60 mL/min   GFR calc Af Amer >60 >60 mL/min    Comment: (NOTE) The eGFR has been calculated using the CKD EPI equation. This calculation has not been validated in all clinical situations. eGFR's persistently <60 mL/min signify possible Chronic Kidney Disease.    Anion gap 8 5 - 15    Comment: Performed at [facility], [place] [address] [place], [place] [address]  CBC     Status: Abnormal   Collection Time: 05/15/18  8:35 AM  Result Value Ref Range   WBC 7.7 4.0 - 10.5 K/uL   RBC 4.02 (L) 4.22 - 5.81 MIL/uL   Hemoglobin 9.8 (L) 13.0 - 17.0 g/dL   HCT 30.7 (L) 39.0 - 52.0 %   MCV 76.4 (L) 78.0 - 100.0 fL   MCH 24.4 (L) 26.0 - 34.0 pg   MCHC 31.9 30.0 - 36.0 g/dL   RDW 15.0 11.5 - 15.5 %   Platelets 528 (H) 150 - 400 K/uL    Comment: Performed at [facility], [place] [address] [place], [place] [address]    MICRO: reviewed IMAGING: Dg Chest 2  View  Result Date: 05/14/2018 CLINICAL DATA:  Chest pain, shortness of breath for days EXAM: CHEST - 2 VIEW COMPARISON:  12/13/2017 FINDINGS: There are bilateral interstitial and alveolar airspace opacities. There  is no pleural effusion or pneumothorax. The heart and mediastinal contours are unremarkable. The osseous structures are unremarkable. IMPRESSION: 1. Bilateral interstitial and alveolar airspace opacities concerning for mild pulmonary edema versus multilobar pneumonia. Electronically Signed   By: Kathreen Devoid   On: 05/14/2018 11:07   Ct Angio Chest Pe W And/or Wo Contrast  Result Date: 05/14/2018 CLINICAL DATA:  Shortness of breath for several days EXAM: CT ANGIOGRAPHY CHEST WITH CONTRAST TECHNIQUE: Multidetector CT imaging of the chest was performed using the standard protocol during bolus administration of intravenous contrast. Multiplanar CT image reconstructions and MIPs were obtained to evaluate the vascular anatomy. CONTRAST:  131m ISOVUE-370 IOPAMIDOL (ISOVUE-370) INJECTION 76% COMPARISON:  Chest x-ray from earlier in the same day. FINDINGS: Cardiovascular: Thoracic aorta shows no aneurysmal dilatation or dissection. No cardiac enlargement is noted. No significant coronary calcifications are noted. Pulmonary artery demonstrates a normal branching pattern. No filling defects to suggest pulmonary emboli are identified. Mediastinum/Nodes: Thoracic inlet is within normal limits. No significant hilar or mediastinal adenopathy is noted. A few small hilar lymph nodes are seen likely reactive in nature. The esophagus is within normal limits. Lungs/Pleura: Lungs are well aerated bilaterally. Patchy alveolar and interstitial changes are identified bilaterally consistent with acute infiltrate. Portion of this may be related to some interstitial edema. No sizable effusion is noted. No pneumothorax is seen. Upper Abdomen: Visualized upper abdomen demonstrates a geographic area of decreased attenuation  within the left lobe of the liver. This measures negative Hounsfield units consistent with fluid although the overall appearance is not suggestive of a cyst. Musculoskeletal: No chest wall abnormality. No acute or significant osseous findings. Review of the MIP images confirms the above findings. IMPRESSION: Patchy alveolar and interstitial infiltrates bilaterally consistent with acute pneumonic process and possible interstitial edema. No evidence of pulmonary emboli. Area of decreased attenuation within the left lobe of the liver. This measures as cystic in appearance although the geographic nature is somewhat unusual. Ultrasound of the abdomen is recommended for further evaluation. Electronically Signed   By: MInez CatalinaM.D.   On: 05/14/2018 13:54   UKoreaAbdomen Limited  Result Date: 05/14/2018 CLINICAL DATA:  LEFT lobe liver mass on CT EXAM: ULTRASOUND ABDOMEN LIMITED RIGHT UPPER QUADRANT COMPARISON:  CT chest 05/14/2018 FINDINGS: Gallbladder: Normally distended without stones or wall thickening. No pericholecystic fluid or sonographic Murphy sign. Common bile duct: Diameter: 5 mm diameter, normal Liver: Echogenic parenchyma, likely fatty infiltration though this can be seen with cirrhosis and certain infiltrative disorders. Shadowing obscures portions of the LEFT lobe of the liver. The LEFT lobe abnormality identified on CT chest imaging is not sonographically delineated. Portal vein is patent on color Doppler imaging with normal direction of blood flow towards the liver. No RIGHT upper quadrant free fluid. IMPRESSION: Probable fatty infiltration of liver as above. Abnormality seen on CT in the LEFT lobe of the liver is not sonographically demonstrated, question obscured by bowel gas; recommend follow-up non emergent MR imaging to characterize. Electronically Signed   By: MLavonia DanaM.D.   On: 05/14/2018 17:16    HISTORICAL MICRO/IMAGING  Assessment/Plan: 89yoM with HIV disease, ART naive, who dx in  MArch 2019 not in care, admitted for DOE, SOB with imaging showing patchy alveolar and interstitial infiltrates concerning for pcp. Infectious work up showing [place] 1 of 4 bottles of staph species  hiv disease = will check cd 4 count, viral load, hepatitis panel, rpr. Anticipate to start ART in 1-2 days. His TLC  is 1.2 thus his CD 4 count could potentially be in the 200-300s.   Presumed pcp = will check pneumocystis stain and also Will start bactrim. If oxygenation worsens, would start steroids. He is not hypoxic on admit. If supp o2 needs increase - would initiate steroids. Will keep levofloxacin on for now. Will check ur legionella antigen(recent travel)  bacteremia - likely contaminant. Will d/c vanco  Partner notification = will check in with patient tomorrow to see if he has disclosed to his boyfriend who is at bedside. Can offer partner testing tomorrow.

## 2018-05-15 NOTE — Progress Notes (Signed)
PHARMACY - PHYSICIAN COMMUNICATION CRITICAL VALUE ALERT - BLOOD CULTURE IDENTIFICATION (BCID)  Charles Morrison is an 37 y.o. male who [facility] with a chief complaint of shortness of breath  Assessment:  Patient admitted for CAP,  Methcillin-resistant coag negative staphylococci in Bcx (1/2 sets, 1/4 bottles)  Name of physician (or Provider) Contacted: Janee Mornhompson (ID also consulted)  Current antibiotics: Levofloxacin  Changes to prescribed antibiotics recommended:  Continue treatment of CAP, Staph spp. Likely contaminant - ID d/c'd vanco per Pharmacy consult  Results for orders placed or performed during the hospital encounter of 05/14/18  Blood Culture ID Panel (Reflexed) (Collected: 05/14/2018 11:07 AM)  Result Value Ref Range   Enterococcus species NOT DETECTED NOT DETECTED   Listeria monocytogenes NOT DETECTED NOT DETECTED   Staphylococcus species DETECTED (A) NOT DETECTED   Staphylococcus aureus NOT DETECTED NOT DETECTED   Methicillin resistance DETECTED (A) NOT DETECTED   Streptococcus species NOT DETECTED NOT DETECTED   Streptococcus agalactiae NOT DETECTED NOT DETECTED   Streptococcus pneumoniae NOT DETECTED NOT DETECTED   Streptococcus pyogenes NOT DETECTED NOT DETECTED   Acinetobacter baumannii NOT DETECTED NOT DETECTED   Enterobacteriaceae species NOT DETECTED NOT DETECTED   Enterobacter cloacae complex NOT DETECTED NOT DETECTED   Escherichia coli NOT DETECTED NOT DETECTED   Klebsiella oxytoca NOT DETECTED NOT DETECTED   Klebsiella pneumoniae NOT DETECTED NOT DETECTED   Proteus species NOT DETECTED NOT DETECTED   Serratia marcescens NOT DETECTED NOT DETECTED   Haemophilus influenzae NOT DETECTED NOT DETECTED   Neisseria meningitidis NOT DETECTED NOT DETECTED   Pseudomonas aeruginosa NOT DETECTED NOT DETECTED   Candida albicans NOT DETECTED NOT DETECTED   Candida glabrata NOT DETECTED NOT DETECTED   Candida krusei NOT DETECTED NOT DETECTED   Candida parapsilosis NOT DETECTED NOT DETECTED   Candida tropicalis NOT DETECTED NOT DETECTED    Juliette Alcideustin Zeigler, PharmD, BCPS.   05/15/2018 1:23 PM

## 2018-05-15 NOTE — Progress Notes (Signed)
PROGRESS NOTE    Charles Morrison  ZOX:[id number] DOB: July 31, 1981 DOA: 05/14/2018 PCP: System, Pcp Not In   Brief Narrative:  Patient is a 37 year old gentleman with history of HIV antibody positive noted per lab work from 12/13/2017 which was obtained during his prior hospitalization for reaction to amoxicillin after being started for treatment for strep throat, however patient unaware as results were finalized post discharge who presents to ED with a 3-day history of worsening shortness of breath, productive cough of clear sputum and noted on CT chest and chest x-ray to have bilateral interstitial and alveolar airspace opacities consistent with an acute pneumonic process versus interstitial edema.  Patient also noted to have a fever 100.7 on presentation to the ED as well as hypoxia.  Patient started empirically on IV Levaquin.  1 out of 2 blood cultures positive for gram-positive bacteria and patient also started on IV Levaquin.  ID consulted.   Assessment & Plan:   Principal Problem:   CAP (community acquired pneumonia) Active Problems:   HIV antibody positive (HCC)   Hypokalemia   Sepsis (HCC)   Liver mass, left lobe   Anemia   Acute hypoxemic respiratory failure (HCC)   Bacteremia due to Gram-positive bacteria  #1 acute hypoxemic respiratory failure secondary to bilateral community-acquired pneumonia/sepsis Patient had presented with hypoxia, worsening shortness of breath, noted to have a fever with a temperature 100.7 with a tachycardia.  Patient still with fevers.  Patient noted on chart review to have a positive HIV antibody from 12/13/2017 which patient states had no idea her office patient was discharged prior to finalization of results.  Concern for possible opportunistic infection due to history of unknown HIV status.  We will continue empiric IV Levaquin for now.  Patient started on IV vancomycin due to preliminary results concerning for a bacteremia.  Consult with ID for further  evaluation and management.???  PCP  #2 HIV antibody positive Noted during prior hospitalization of 12/13/2017.  Patient states had no idea he was HIV antibody positive.  Patient was likely discharge prior to finalization of results.  It is noted that ID try to contact patient however that was unsuccessful and ID subsequently informed the health department per prior note.  Repeat HIV antibody pending.  Consult with ID for further evaluation and management.  Patient will likely need a CD4 count and a viral load however will defer to ID.  3.  1/2 bacteremia gram-positive Placed empirically on IV vancomycin while cultures are pending.  ID consultation.  4.  Hypokalemia Replete.  5.  Anemia Likely anemia of chronic disease as patient with iron level of 15, ferritin of 232 with a TIBC of 171.  No overt bleeding.  Follow H&H.  6 liver mass/left lobe Area of decreased attenuation noted within the left lobe of the liver.  Abdominal ultrasound done with probable fatty infiltration of the liver.  May need nonemergent MR imaging for follow-up.   DVT prophylaxis: Lovenox Code Status: Full Family Communication: Updated patient at bedside.  No family present. Disposition Plan: To be determined.   Consultants:   Infectious diseases pending  Procedures:   CT angiogram chest 05/14/2018  Chest x-ray 05/14/2018  Abdominal ultrasound 05/14/2018  Antimicrobials:   IV Levaquin 05/14/2018  IV vancomycin 05/14/2018   Subjective: Patient laying in bed.  Still with some complaints of shortness of breath.  Patient with some complaints of chills early on this morning which he states has since resolved.  Objective: Vitals:   05/14/18 1810  05/14/18 2340 05/15/18 0304 05/15/18 0452  BP: 120/83 (!) 144/82 118/67   Pulse: 89 (!) 111 (!) 118   Resp: 18 16 18    Temp: 98.3 F (36.8 C) 98.9 F (37.2 C) (!) 100.9 F (38.3 C) 99.6 F (37.6 C)  TempSrc: Oral Oral Oral Oral  SpO2: 100% 91% 98%   Weight:  90.7 kg     Height: 5\' 10"  (1.778 m)       Intake/Output Summary (Last 24 hours) at 05/15/2018 1302 Last data filed at 05/15/2018 0600 Gross per 24 hour  Intake 2057.37 ml  Output 300 ml  Net 1757.37 ml   Filed Weights   05/14/18 1810  Weight: 90.7 kg    Examination:  General exam: NAD. Respiratory system: Some coarse scattered breath sounds.  Normal respiratory effort.  Cardiovascular system: Regular rate and rhythm no murmurs rubs or gallops.  No JVD.  No lower extremity edema.  Gastrointestinal system: Abdomen is soft, nontender, nondistended, positive bowel sounds.  No rebound.  No guarding.  Central nervous system: Alert and oriented. No focal neurological deficits. Extremities: Symmetric 5 x 5 power. Skin: No rashes, lesions or ulcers Psychiatry: Judgement and insight appear normal. Mood & affect appropriate.     Data Reviewed: I have personally reviewed following labs and imaging studies  CBC: Recent Labs  Lab 05/14/18 1105 05/15/18 0835  WBC 7.9 7.7  NEUTROABS 6.0  --   HGB 10.6* 9.8*  HCT 32.5* 30.7*  MCV 75.8* 76.4*  PLT 546* 528*   Basic Metabolic Panel: Recent Labs  Lab 05/14/18 1105 05/15/18 0835  NA 141 143  K 2.7* 3.4*  CL 101 107  CO2 26 28  GLUCOSE 101* 106*  BUN 9 6  CREATININE 0.82 0.80  CALCIUM 8.3* 8.3*  MG 2.2  --    GFR: Estimated Creatinine Clearance: 143.2 mL/min (by C-G formula based on SCr of 0.8 mg/dL). Liver Function Tests: Recent Labs  Lab 05/14/18 1105  AST 40  ALT 20  ALKPHOS 49  BILITOT 0.8  PROT 7.9  ALBUMIN 2.5*   Recent Labs  Lab 05/14/18 1105  LIPASE 22   No results for input(s): AMMONIA in the last 168 hours. Coagulation Profile: No results for input(s): INR, PROTIME in the last 168 hours. Cardiac Enzymes: No results for input(s): CKTOTAL, CKMB, CKMBINDEX, TROPONINI in the last 168 hours. BNP (last 3 results) No results for input(s): PROBNP in the last 8760 hours. HbA1C: No results for input(s):  HGBA1C in the last 72 hours. CBG: No results for input(s): GLUCAP in the last 168 hours. Lipid Profile: No results for input(s): CHOL, HDL, LDLCALC, TRIG, CHOLHDL, LDLDIRECT in the last 72 hours. Thyroid Function Tests: No results for input(s): TSH, T4TOTAL, FREET4, T3FREE, THYROIDAB in the last 72 hours. Anemia Panel: Recent Labs    05/14/18 1800 05/15/18 0409  VITAMINB12  --  381  FOLATE  --  13.6  FERRITIN 232  --   TIBC 171*  --   IRON 15*  --   RETICCTPCT 0.6  --    Sepsis Labs: Recent Labs  Lab 05/14/18 1109  LATICACIDVEN 1.27    Recent Results (from the past 240 hour(s))  Blood Culture (routine x 2)     Status: None (Preliminary result)   Collection Time: 05/14/18 11:07 AM  Result Value Ref Range Status   Specimen Description   Final    BLOOD LEFT ANTECUBITAL Performed at [facility], [address] [place], [place] [address]  Special Requests   Final    BOTTLES DRAWN AEROBIC AND ANAEROBIC Blood Culture adequate volume Performed at [facility], [address] [place], [place] [address]    Culture   Final    NO GROWTH < 24 HOURS Performed at [facility], [address] [place], [place] [address]    Report Status PENDING  Incomplete  Blood Culture (routine x 2)     Status: None (Preliminary result)   Collection Time: 05/14/18 11:07 AM  Result Value Ref Range Status   Specimen Description   Final    BLOOD RIGHT ANTECUBITAL Performed at [facility], [address] [place], [place] [address]    Special Requests   Final    BOTTLES DRAWN AEROBIC AND ANAEROBIC Blood Culture adequate volume Performed at [facility], [address] [place], [place] [address]    Culture  Setup Time   Final    GRAM POSITIVE COCCI IN CLUSTERS AEROBIC BOTTLE ONLY CRITICAL RESULT CALLED TO, READ BACK BY AND VERIFIED WITH: Bethel Born [id number] AT 1208 BY CM Performed at [facility], [address] [place], [place] [address]    Culture GRAM POSITIVE COCCI  Final   Report Status PENDING  Incomplete  Blood Culture ID Panel (Reflexed)     Status: Abnormal   Collection Time: 05/14/18 11:07 AM  Result Value Ref Range Status   Enterococcus species NOT DETECTED NOT DETECTED Final   Listeria monocytogenes NOT DETECTED NOT DETECTED Final   Staphylococcus species DETECTED (A) NOT DETECTED Final    Comment: Methicillin (oxacillin) resistant coagulase negative staphylococcus. Possible blood culture contaminant (unless isolated from more than one blood culture draw or clinical case suggests pathogenicity). No antibiotic treatment is indicated for blood  culture contaminants. CRITICAL RESULT CALLED TO, READ BACK BY AND VERIFIED WITH: PHARMD C BARMAN [id number] AT 1208 BY CM    Staphylococcus aureus NOT DETECTED NOT DETECTED Final   Methicillin resistance DETECTED (A) NOT DETECTED Final    Comment: CRITICAL RESULT CALLED TO, READ BACK BY AND VERIFIED WITH: PHARMD C BARMAN 0822/19 AT 1208 BY CM    Streptococcus species NOT DETECTED NOT DETECTED Final   Streptococcus agalactiae NOT DETECTED NOT DETECTED Final   Streptococcus pneumoniae NOT DETECTED NOT DETECTED Final   Streptococcus pyogenes NOT DETECTED NOT DETECTED Final   Acinetobacter baumannii NOT DETECTED NOT DETECTED Final   Enterobacteriaceae species NOT DETECTED NOT DETECTED Final   Enterobacter cloacae complex NOT DETECTED NOT DETECTED Final   Escherichia coli NOT DETECTED NOT DETECTED Final   Klebsiella oxytoca NOT DETECTED NOT DETECTED Final   Klebsiella pneumoniae NOT DETECTED NOT DETECTED Final   Proteus species NOT DETECTED NOT DETECTED Final   Serratia marcescens NOT DETECTED NOT DETECTED Final   Haemophilus influenzae NOT DETECTED NOT DETECTED Final   Neisseria meningitidis NOT DETECTED NOT DETECTED Final   Pseudomonas aeruginosa NOT DETECTED NOT DETECTED Final   Candida albicans NOT DETECTED NOT DETECTED Final    Candida glabrata NOT DETECTED NOT DETECTED Final   Candida krusei NOT DETECTED NOT DETECTED Final   Candida parapsilosis NOT DETECTED NOT DETECTED Final   Candida tropicalis NOT DETECTED NOT DETECTED Final    Comment: Performed at [facility], [address], [place], [place] [address]  Respiratory Panel by PCR     Status: None   Collection Time: 05/15/18  2:56 AM  Result Value Ref Range Status   Adenovirus NOT DETECTED  NOT DETECTED Final   Coronavirus 229E NOT DETECTED NOT DETECTED Final   Coronavirus HKU1 NOT DETECTED NOT DETECTED Final   Coronavirus NL63 NOT DETECTED NOT DETECTED Final   Coronavirus OC43 NOT DETECTED NOT DETECTED Final   Metapneumovirus NOT DETECTED NOT DETECTED Final   Rhinovirus / Enterovirus NOT DETECTED NOT DETECTED Final   Influenza A NOT DETECTED NOT DETECTED Final   Influenza B NOT DETECTED NOT DETECTED Final   Parainfluenza Virus 1 NOT DETECTED NOT DETECTED Final   Parainfluenza Virus 2 NOT DETECTED NOT DETECTED Final   Parainfluenza Virus 3 NOT DETECTED NOT DETECTED Final   Parainfluenza Virus 4 NOT DETECTED NOT DETECTED Final   Respiratory Syncytial Virus NOT DETECTED NOT DETECTED Final   Bordetella pertussis NOT DETECTED NOT DETECTED Final   Chlamydophila pneumoniae NOT DETECTED NOT DETECTED Final   Mycoplasma pneumoniae NOT DETECTED NOT DETECTED Final    Comment: Performed at [facility], [address], [place], [place] [address]         Radiology Studies: Dg Chest 2 View  Result Date: 05/14/2018 CLINICAL DATA:  Chest pain, shortness of breath for days EXAM: CHEST - 2 VIEW COMPARISON:  12/13/2017 FINDINGS: There are bilateral interstitial and alveolar airspace opacities. There is no pleural effusion or pneumothorax. The heart and mediastinal contours are unremarkable. The osseous structures are unremarkable. IMPRESSION: 1. Bilateral interstitial and alveolar airspace opacities concerning for mild pulmonary edema versus multilobar  pneumonia. Electronically Signed   By: Elige Ko   On: 05/14/2018 11:07   Ct Angio Chest Pe W And/or Wo Contrast  Result Date: 05/14/2018 CLINICAL DATA:  Shortness of breath for several days EXAM: CT ANGIOGRAPHY CHEST WITH CONTRAST TECHNIQUE: Multidetector CT imaging of the chest was performed using the standard protocol during bolus administration of intravenous contrast. Multiplanar CT image reconstructions and MIPs were obtained to evaluate the vascular anatomy. CONTRAST:  ISOVUE-370 IOPAMIDOL (ISOVUE-370) INJECTION 76% COMPARISON:  Chest x-ray from earlier in the same day. FINDINGS: Cardiovascular: Thoracic aorta shows no aneurysmal dilatation or dissection. No cardiac enlargement is noted. No significant coronary calcifications are noted. Pulmonary artery demonstrates a normal branching pattern. No filling defects to suggest pulmonary emboli are identified. Mediastinum/Nodes: Thoracic inlet is within normal limits. No significant hilar or mediastinal adenopathy is noted. A few small hilar lymph nodes are seen likely reactive in nature. The esophagus is within normal limits. Lungs/Pleura: Lungs are well aerated bilaterally. Patchy alveolar and interstitial changes are identified bilaterally consistent with acute infiltrate. Portion of this may be related to some interstitial edema. No sizable effusion is noted. No pneumothorax is seen. Upper Abdomen: Visualized upper abdomen demonstrates a geographic area of decreased attenuation within the left lobe of the liver. This measures negative Hounsfield units consistent with fluid although the overall appearance is not suggestive of a cyst. Musculoskeletal: No chest wall abnormality. No acute or significant osseous findings. Review of the MIP images confirms the above findings. IMPRESSION: Patchy alveolar and interstitial infiltrates bilaterally consistent with acute pneumonic process and possible interstitial edema. No evidence of pulmonary emboli. Area  of decreased attenuation within the left lobe of the liver. This measures as cystic in appearance although the geographic nature is somewhat unusual. Ultrasound of the abdomen is recommended for further evaluation. Electronically Signed   By: Alcide Clever M.D.   On: 05/14/2018 13:54   US Abdomen Limited  Result Date: 05/14/2018 CLINICAL DATA:  LEFT lobe liver mass on CT EXAM: ULTRASOUND ABDOMEN LIMITED RIGHT UPPER QUADRANT  COMPARISON:  CT chest 05/14/2018 FINDINGS: Gallbladder: Normally distended without stones or wall thickening. No pericholecystic fluid or sonographic Murphy sign. Common bile duct: Diameter: 5 mm diameter, normal Liver: Echogenic parenchyma, likely fatty infiltration though this can be seen with cirrhosis and certain infiltrative disorders. Shadowing obscures portions of the LEFT lobe of the liver. The LEFT lobe abnormality identified on CT chest imaging is not sonographically delineated. Portal vein is patent on color Doppler imaging with normal direction of blood flow towards the liver. No RIGHT upper quadrant free fluid. IMPRESSION: Probable fatty infiltration of liver as above. Abnormality seen on CT in the LEFT lobe of the liver is not sonographically demonstrated, question obscured by bowel gas; recommend follow-up non emergent MR imaging to characterize. Electronically Signed   By: Ulyses SouthwardMark  Boles M.D.   On: 05/14/2018 17:16        Scheduled Meds: . enoxaparin (LOVENOX) injection  40 mg Subcutaneous Q24H  . potassium chloride  40 mEq Oral Once  . sodium chloride flush  3 mL Intravenous Q12H   Continuous Infusions: . sodium chloride    . levofloxacin (LEVAQUIN) IV 750 mg (05/15/18 1255)     LOS: 1 day    Time spent: 40 mins     Ramiro Harvestaniel Thompson, MD [facility] Pager [phone]  If 7PM-7AM, please contact night-coverage [email or web address] Password [email or web address] 05/15/2018, 1:02 PM

## 2018-05-16 ENCOUNTER — Other Ambulatory Visit: Payer: Self-pay

## 2018-05-16 ENCOUNTER — Other Ambulatory Visit: Payer: Self-pay | Admitting: Pharmacist Clinician (PhC)/ Clinical Pharmacy Specialist

## 2018-05-16 LAB — COMPREHENSIVE METABOLIC PANEL
ALBUMIN: 2.3 g/dL — AB (ref 3.5–5.0)
ALT: 16 U/L (ref 0–44)
ANION GAP: 9 (ref 5–15)
AST: 33 U/L (ref 15–41)
Alkaline Phosphatase: 45 U/L (ref 38–126)
BUN: 6 mg/dL (ref 6–20)
CO2: 27 mmol/L (ref 22–32)
Calcium: 8.4 mg/dL — ABNORMAL LOW (ref 8.9–10.3)
Chloride: 103 mmol/L (ref 98–111)
Creatinine, Ser: 0.75 mg/dL (ref 0.61–1.24)
GFR calc Af Amer: >60 mL/min (ref >60)
GFR calc non Af Amer: >60 mL/min (ref >60)
GLUCOSE: 104 mg/dL — AB (ref 70–99)
POTASSIUM: 3 mmol/L — AB (ref 3.5–5.1)
SODIUM: 139 mmol/L (ref 135–145)
Total Bilirubin: 0.6 mg/dL (ref 0.3–1.2)
Total Protein: 7.2 g/dL (ref 6.5–8.1)

## 2018-05-16 LAB — MAGNESIUM: Magnesium: 1.9 mg/dL (ref 1.7–2.4)

## 2018-05-16 LAB — CBC WITH DIFFERENTIAL/PLATELET
BASOS ABS: 0 10*3/uL (ref 0.0–0.1)
Basophils Relative: 0 %
EOS PCT: 0 %
Eosinophils Absolute: 0 10*3/uL (ref 0.0–0.7)
HEMATOCRIT: 31.9 % — AB (ref 39.0–52.0)
Hemoglobin: 10.2 g/dL — ABNORMAL LOW (ref 13.0–17.0)
LYMPHS ABS: 1 10*3/uL (ref 0.7–4.0)
LYMPHS PCT: 8 %
MCH: 24 pg — AB (ref 26.0–34.0)
MCHC: 32 g/dL (ref 30.0–36.0)
MCV: 75.1 fL — AB (ref 78.0–100.0)
MONO ABS: 0.5 10*3/uL (ref 0.1–1.0)
Monocytes Relative: 4 %
NEUTROS ABS: 10.3 10*3/uL — AB (ref 1.7–7.7)
Neutrophils Relative %: 88 %
Platelets: 500 10*3/uL — ABNORMAL HIGH (ref 150–400)
RBC: 4.25 MIL/uL (ref 4.22–5.81)
RDW: 14.9 % (ref 11.5–15.5)
WBC: 11.8 10*3/uL — ABNORMAL HIGH (ref 4.0–10.5)

## 2018-05-16 LAB — LEGIONELLA PNEUMOPHILA SEROGP 1 UR AG: L. PNEUMOPHILA SEROGP 1 UR AG: NEGATIVE

## 2018-05-16 LAB — HEPATITIS C ANTIBODY: HCV AB: 0.1 {s_co_ratio} (ref 0.0–0.9)

## 2018-05-16 LAB — HEPATITIS B SURFACE ANTIGEN: Hepatitis B Surface Ag: NEGATIVE

## 2018-05-16 LAB — HIV 1/2 AB DIFFERENTIATION
HIV 1 Ab: POSITIVE — AB
HIV 2 Ab: NEGATIVE

## 2018-05-16 LAB — HIV-1 RNA QUANT-NO REFLEX-BLD
HIV 1 RNA Quant: 154000 copies/mL
LOG10 HIV-1 RNA: 5.188 log10copy/mL

## 2018-05-16 LAB — T-HELPER CELLS (CD4) COUNT (NOT AT ARMC)
CD4 T CELL HELPER: 6 % — AB (ref 33–55)
CD4 T Cell Abs: 90 /uL — ABNORMAL LOW (ref 400–2700)

## 2018-05-16 LAB — RPR: RPR: NONREACTIVE

## 2018-05-16 LAB — HIV ANTIBODY (ROUTINE TESTING W REFLEX): HIV SCREEN 4TH GENERATION: REACTIVE — AB

## 2018-05-16 LAB — HEPATITIS B SURFACE ANTIBODY, QUANTITATIVE: Hepatitis B-Post: <3.1 m[IU]/mL — ABNORMAL LOW (ref >9.9)

## 2018-05-16 MED ORDER — GUAIFENESIN ER 600 MG PO TB12
1200.0000 mg | ORAL_TABLET | Freq: Two times a day (BID) | ORAL | Status: DC
  Administered 2018-05-16 – 2018-05-20 (×8): 1200 mg via ORAL
  Filled 2018-05-16 (×8): qty 2

## 2018-05-16 MED ORDER — IPRATROPIUM-ALBUTEROL 0.5-2.5 (3) MG/3ML IN SOLN
3.0000 mL | RESPIRATORY_TRACT | Status: DC | PRN
  Filled 2018-05-16: qty 3

## 2018-05-16 MED ORDER — IPRATROPIUM-ALBUTEROL 0.5-2.5 (3) MG/3ML IN SOLN
3.0000 mL | Freq: Four times a day (QID) | RESPIRATORY_TRACT | Status: DC
  Administered 2018-05-16: 3 mL via RESPIRATORY_TRACT

## 2018-05-16 MED ORDER — POTASSIUM CHLORIDE CRYS ER 20 MEQ PO TBCR
40.0000 meq | EXTENDED_RELEASE_TABLET | ORAL | Status: AC
  Administered 2018-05-16 (×2): 40 meq via ORAL
  Filled 2018-05-16 (×2): qty 2

## 2018-05-16 MED ORDER — BICTEGRAVIR-EMTRICITAB-TENOFOV 50-200-25 MG PO TABS
1.0000 | ORAL_TABLET | Freq: Every day | ORAL | Status: DC
  Administered 2018-05-16 – 2018-05-20 (×5): 1 via ORAL
  Filled 2018-05-16 (×5): qty 1

## 2018-05-16 MED ORDER — BICTEGRAVIR-EMTRICITAB-TENOFOV 50-200-25 MG PO TABS: 1.0000 | ORAL_TABLET | Freq: Every day | ORAL | 2 refills | Status: DC

## 2018-05-16 MED ORDER — IPRATROPIUM-ALBUTEROL 0.5-2.5 (3) MG/3ML IN SOLN
3.0000 mL | Freq: Two times a day (BID) | RESPIRATORY_TRACT | Status: DC
  Administered 2018-05-17 – 2018-05-20 (×7): 3 mL via RESPIRATORY_TRACT
  Filled 2018-05-16 (×7): qty 3

## 2018-05-16 MED ORDER — FUROSEMIDE 10 MG/ML IJ SOLN
20.0000 mg | Freq: Once | INTRAMUSCULAR | Status: AC
  Administered 2018-05-16: 20 mg via INTRAVENOUS
  Filled 2018-05-16: qty 2

## 2018-05-16 MED FILL — BIKTARVY 50-200-25 MG TABS: 50-200-25 | 30 days supply | Qty: 30 | Fill #0

## 2018-05-16 NOTE — Progress Notes (Signed)
Pt am K+ - 3.0, md notified

## 2018-05-16 NOTE — Progress Notes (Signed)
[employer]er, [employer] [facility] Rx to [facility].  [employer] card:  ID [id number] BIN [id number] PCN ACCESS GRP [id number]

## 2018-05-16 NOTE — Progress Notes (Signed)
PROGRESS NOTE    Charles Morrison  ZHY:[id number] DOB: 08/10/1981 DOA: 05/14/2018 PCP: System, Pcp Not In   Brief Narrative:  Patient is a 37 year old gentleman with history of HIV antibody positive noted per lab work from 12/13/2017 which was obtained during his prior hospitalization for reaction to amoxicillin after being started for treatment for strep throat, however patient unaware as results were finalized post discharge who presents to ED with a 3-day history of worsening shortness of breath, productive cough of clear sputum and noted on CT chest and chest x-ray to have bilateral interstitial and alveolar airspace opacities consistent with an acute pneumonic process versus interstitial edema.  Patient also noted to have a fever 100.7 on presentation to the ED as well as hypoxia.  Patient started empirically on IV Levaquin.  1 out of 2 blood cultures positive for gram-positive bacteria and patient also started on IV Levaquin.  ID consulted.   Assessment & Plan:   Principal Problem:   CAP (community acquired pneumonia) Active Problems:   HIV antibody positive (HCC)   Hypokalemia   Sepsis (HCC)   Liver mass, left lobe   Anemia   Acute hypoxemic respiratory failure (HCC)   Bacteremia due to Gram-positive bacteria  #1 acute hypoxemic respiratory failure secondary to probable PCP.  Patient had presented with hypoxia, worsening shortness of breath, noted to have a fever with a temperature 100.7 with a tachycardia.  Patient still with fevers.  Patient noted on chart review to have a positive HIV antibody from 12/13/2017 which patient states had no idea her office patient was discharged prior to finalization of results.  Concern for possible opportunistic infection due to history of unknown HIV status.  Patient seen by ID IV vancomycin has been discontinued and patient was placed on IV Bactrim due to concerns for PCP.  IV Levaquin has been continued while urine Legionella antigen is pending.  ID  following and appreciate input and recommendations.   #2 HIV antibody positive Noted during prior hospitalization of 12/13/2017.  Patient stated had no idea he was HIV antibody positive.  Patient was likely discharge prior to finalization of results.  It is noted that ID try to contact patient however that was unsuccessful and ID subsequently informed the health department per prior note.  Repeat HIV antibody positive and reactive.  RPR negative.  Hep B surface antigen negative.  Hepatitis C antibody 0.1.  CD4 count of 90.  Viral load of 154,000.  ID following and patient to be started on therapy.  Per ID.   3.  1/4 bacteremia gram-positive/coagulase-negative staph likely contaminant Patient seen by ID 1/4 bottles with staph species coagulase-negative staph likely contaminant.  IV vancomycin has been discontinued per ID.  ID following.   4.  Hypokalemia Replete.  Magnesium level at 1.9.  5.  Anemia Likely anemia of chronic disease as patient with iron level of 15, ferritin of 232 with a TIBC of 171.  No overt bleeding.  Hemoglobin is stable at 10.2.  Follow H&H.  6 liver mass/left lobe Area of decreased attenuation noted within the left lobe of the liver.  Abdominal ultrasound done with probable fatty infiltration of the liver.  May need nonemergent MR imaging for follow-up.   DVT prophylaxis: Lovenox Code Status: Full Family Communication: Updated patient at bedside.  No family present. Disposition Plan: To be determined.   Consultants:   Infectious diseases: Dr. Drue SecondSnider 05/15/2018  Procedures:   CT angiogram chest 05/14/2018  Chest x-ray 05/14/2018  Abdominal ultrasound  05/14/2018  Antimicrobials:   IV Levaquin 05/14/2018  IV vancomycin 05/14/2018>>>>>> 05/15/2018  IV Bactrim 05/15/2018   Subjective: Patient laying in bed.  Patient states still does not feel too well.  Denies chest pain.  Still with a productive cough of clear mucus.  Chills improving.    Objective: Vitals:     05/15/18 0452 05/15/18 1539 05/15/18 2057 05/16/18 0448  BP:  126/72 111/74 124/72  Pulse:  (!) 118 98 95  Resp:  17 16 14   Temp: 99.6 F (37.6 C) 99.8 F (37.7 C) 98.6 F (37 C) 98.7 F (37.1 C)  TempSrc: Oral Oral Oral Oral  SpO2:  98% 100% 94%  Weight:      Height:        Intake/Output Summary (Last 24 hours) at 05/16/2018 1218 Last data filed at 05/16/2018 0600 Gross per 24 hour  Intake 1342.45 ml  Output -  Net 1342.45 ml   Filed Weights   05/14/18 1810  Weight: 90.7 kg    Examination:  General exam: NAD. Respiratory system: Scattered diffuse coarse breath sounds.  No wheezing.  No rhonchi. Normal respiratory effort.  Cardiovascular system: RRR no murmurs rubs or gallops.  No JVD.  No lower extremity edema.  Gastrointestinal system: Abdomen is nontender, nondistended, soft, positive bowel sounds.  No rebound.  No guarding.  Central nervous system: Alert and oriented. No focal neurological deficits. Extremities: Symmetric 5 x 5 power. Skin: No rashes, lesions or ulcers Psychiatry: Judgement and insight appear normal. Mood & affect appropriate.     Data Reviewed: I have personally reviewed following labs and imaging studies  CBC: Recent Labs  Lab 05/14/18 1105 05/15/18 0835 05/16/18 0356  WBC 7.9 7.7 11.8*  NEUTROABS 6.0  --  10.3*  HGB 10.6* 9.8* 10.2*  HCT 32.5* 30.7* 31.9*  MCV 75.8* 76.4* 75.1*  PLT 546* 528* 500*   Basic Metabolic Panel: Recent Labs  Lab 05/14/18 1105 05/15/18 0835 05/16/18 0356  NA 141 143 139  K 2.7* 3.4* 3.0*  CL 101 107 103  CO2 26 28 27   GLUCOSE 101* 106* 104*  BUN 9 6 6   CREATININE 0.82 0.80 0.75  CALCIUM 8.3* 8.3* 8.4*  MG 2.2  --  1.9   GFR: Estimated Creatinine Clearance: 143.2 mL/min (by C-G formula based on SCr of 0.75 mg/dL). Liver Function Tests: Recent Labs  Lab 05/14/18 1105 05/16/18 0356  AST 40 33  ALT 20 16  ALKPHOS 49 45  BILITOT 0.8 0.6  PROT 7.9 7.2  ALBUMIN 2.5* 2.3*   Recent Labs   Lab 05/14/18 1105  LIPASE 22   No results for input(s): AMMONIA in the last 168 hours. Coagulation Profile: No results for input(s): INR, PROTIME in the last 168 hours. Cardiac Enzymes: No results for input(s): CKTOTAL, CKMB, CKMBINDEX, TROPONINI in the last 168 hours. BNP (last 3 results) No results for input(s): PROBNP in the last 8760 hours. HbA1C: No results for input(s): HGBA1C in the last 72 hours. CBG: No results for input(s): GLUCAP in the last 168 hours. Lipid Profile: No results for input(s): CHOL, HDL, LDLCALC, TRIG, CHOLHDL, LDLDIRECT in the last 72 hours. Thyroid Function Tests: No results for input(s): TSH, T4TOTAL, FREET4, T3FREE, THYROIDAB in the last 72 hours. Anemia Panel: Recent Labs    05/14/18 1800 05/15/18 0409  VITAMINB12  --  381  FOLATE  --  13.6  FERRITIN 232  --   TIBC 171*  --   IRON 15*  --   RETICCTPCT  0.6  --    Sepsis Labs: Recent Labs  Lab 05/14/18 1109  LATICACIDVEN 1.27    Recent Results (from the past 240 hour(s))  Blood Culture (routine x 2)     Status: None (Preliminary result)   Collection Time: 05/14/18 11:07 AM  Result Value Ref Range Status   Specimen Description   Final    BLOOD LEFT ANTECUBITAL Performed at [facility] [place] [facility], [address], [place], [place] [address]    Special Requests   Final    BOTTLES DRAWN AEROBIC AND ANAEROBIC Blood Culture adequate volume Performed at [facility], [address] [place], [place] [address]    Culture   Final    NO GROWTH 2 DAYS Performed at [facility], [address] [place], [place] [address]    Report Status PENDING  Incomplete  Blood Culture (routine x 2)     Status: Abnormal (Preliminary result)   Collection Time: 05/14/18 11:07 AM  Result Value Ref Range Status   Specimen Description   Final    BLOOD RIGHT ANTECUBITAL Performed at [facility], [address] [place], [place] [address]    Special  Requests   Final    BOTTLES DRAWN AEROBIC AND ANAEROBIC Blood Culture adequate volume Performed at [facility], [address] [place], [place] [address]    Culture  Setup Time   Final    GRAM POSITIVE COCCI IN CLUSTERS AEROBIC BOTTLE ONLY CRITICAL RESULT CALLED TO, READ BACK BY AND VERIFIED WITH: Bethel Born [id number] AT 1208 BY CM Performed at [facility], [address] [place], [place] [address]    Culture STAPHYLOCOCCUS SPECIES (COAGULASE NEGATIVE) (A)  Final   Report Status PENDING  Incomplete  Blood Culture ID Panel (Reflexed)     Status: Abnormal   Collection Time: 05/14/18 11:07 AM  Result Value Ref Range Status   Enterococcus species NOT DETECTED NOT DETECTED Final   Listeria monocytogenes NOT DETECTED NOT DETECTED Final   Staphylococcus species DETECTED (A) NOT DETECTED Final    Comment: Methicillin (oxacillin) resistant coagulase negative staphylococcus. Possible blood culture contaminant (unless isolated from more than one blood culture draw or clinical case suggests pathogenicity). No antibiotic treatment is indicated for blood  culture contaminants. CRITICAL RESULT CALLED TO, READ BACK BY AND VERIFIED WITH: PHARMD C BARMAN [id number] AT 1208 BY CM    Staphylococcus aureus NOT DETECTED NOT DETECTED Final   Methicillin resistance DETECTED (A) NOT DETECTED Final    Comment: CRITICAL RESULT CALLED TO, READ BACK BY AND VERIFIED WITH: PHARMD C BARMAN 0822/19 AT 1208 BY CM    Streptococcus species NOT DETECTED NOT DETECTED Final   Streptococcus agalactiae NOT DETECTED NOT DETECTED Final   Streptococcus pneumoniae NOT DETECTED NOT DETECTED Final   Streptococcus pyogenes NOT DETECTED NOT DETECTED Final   Acinetobacter baumannii NOT DETECTED NOT DETECTED Final   Enterobacteriaceae species NOT DETECTED NOT DETECTED Final   Enterobacter cloacae complex NOT DETECTED NOT DETECTED Final   Escherichia coli NOT DETECTED NOT DETECTED Final   Klebsiella  oxytoca NOT DETECTED NOT DETECTED Final   Klebsiella pneumoniae NOT DETECTED NOT DETECTED Final   Proteus species NOT DETECTED NOT DETECTED Final   Serratia marcescens NOT DETECTED NOT DETECTED Final   Haemophilus influenzae NOT DETECTED NOT DETECTED Final   Neisseria meningitidis NOT DETECTED NOT DETECTED Final   Pseudomonas aeruginosa NOT DETECTED NOT DETECTED Final   Candida albicans NOT DETECTED NOT DETECTED  Final   Candida glabrata NOT DETECTED NOT DETECTED Final   Candida krusei NOT DETECTED NOT DETECTED Final   Candida parapsilosis NOT DETECTED NOT DETECTED Final   Candida tropicalis NOT DETECTED NOT DETECTED Final    Comment: Performed at [facility], [address] [place], [place] [address]  Respiratory Panel by PCR     Status: None   Collection Time: 05/15/18  2:56 AM  Result Value Ref Range Status   Adenovirus NOT DETECTED NOT DETECTED Final   Coronavirus 229E NOT DETECTED NOT DETECTED Final   Coronavirus HKU1 NOT DETECTED NOT DETECTED Final   Coronavirus NL63 NOT DETECTED NOT DETECTED Final   Coronavirus OC43 NOT DETECTED NOT DETECTED Final   Metapneumovirus NOT DETECTED NOT DETECTED Final   Rhinovirus / Enterovirus NOT DETECTED NOT DETECTED Final   Influenza A NOT DETECTED NOT DETECTED Final   Influenza B NOT DETECTED NOT DETECTED Final   Parainfluenza Virus 1 NOT DETECTED NOT DETECTED Final   Parainfluenza Virus 2 NOT DETECTED NOT DETECTED Final   Parainfluenza Virus 3 NOT DETECTED NOT DETECTED Final   Parainfluenza Virus 4 NOT DETECTED NOT DETECTED Final   Respiratory Syncytial Virus NOT DETECTED NOT DETECTED Final   Bordetella pertussis NOT DETECTED NOT DETECTED Final   Chlamydophila pneumoniae NOT DETECTED NOT DETECTED Final   Mycoplasma pneumoniae NOT DETECTED NOT DETECTED Final    Comment: Performed at [facility], [address] [place], [place] [address]         Radiology Studies: Ct Angio Chest Pe W And/or Wo Contrast  Result Date:  05/14/2018 CLINICAL DATA:  Shortness of breath for several days EXAM: CT ANGIOGRAPHY CHEST WITH CONTRAST TECHNIQUE: Multidetector CT imaging of the chest was performed using the standard protocol during bolus administration of intravenous contrast. Multiplanar CT image reconstructions and MIPs were obtained to evaluate the vascular anatomy. CONTRAST:  ISOVUE-370 IOPAMIDOL (ISOVUE-370) INJECTION 76% COMPARISON:  Chest x-ray from earlier in the same day. FINDINGS: Cardiovascular: Thoracic aorta shows no aneurysmal dilatation or dissection. No cardiac enlargement is noted. No significant coronary calcifications are noted. Pulmonary artery demonstrates a normal branching pattern. No filling defects to suggest pulmonary emboli are identified. Mediastinum/Nodes: Thoracic inlet is within normal limits. No significant hilar or mediastinal adenopathy is noted. A few small hilar lymph nodes are seen likely reactive in nature. The esophagus is within normal limits. Lungs/Pleura: Lungs are well aerated bilaterally. Patchy alveolar and interstitial changes are identified bilaterally consistent with acute infiltrate. Portion of this may be related to some interstitial edema. No sizable effusion is noted. No pneumothorax is seen. Upper Abdomen: Visualized upper abdomen demonstrates a geographic area of decreased attenuation within the left lobe of the liver. This measures negative Hounsfield units consistent with fluid although the overall appearance is not suggestive of a cyst. Musculoskeletal: No chest wall abnormality. No acute or significant osseous findings. Review of the MIP images confirms the above findings. IMPRESSION: Patchy alveolar and interstitial infiltrates bilaterally consistent with acute pneumonic process and possible interstitial edema. No evidence of pulmonary emboli. Area of decreased attenuation within the left lobe of the liver. This measures as cystic in appearance although the geographic nature is  somewhat unusual. Ultrasound of the abdomen is recommended for further evaluation. Electronically Signed   By: Alcide Clever M.D.   On: 05/14/2018 13:54   US Abdomen Limited  Result Date: 05/14/2018 CLINICAL DATA:  LEFT lobe liver mass on CT EXAM: ULTRASOUND ABDOMEN LIMITED RIGHT UPPER QUADRANT COMPARISON:  CT chest 05/14/2018 FINDINGS: Gallbladder: Normally distended without stones or wall thickening. No pericholecystic fluid or sonographic Murphy sign. Common bile duct: Diameter: 5 mm diameter, normal Liver: Echogenic parenchyma, likely fatty infiltration though this can be seen with cirrhosis and certain infiltrative disorders. Shadowing obscures portions of the LEFT lobe of the liver. The LEFT lobe abnormality identified on CT chest imaging is not sonographically delineated. Portal vein is patent on color Doppler imaging with normal direction of blood flow towards the liver. No RIGHT upper quadrant free fluid. IMPRESSION: Probable fatty infiltration of liver as above. Abnormality seen on CT in the LEFT lobe of the liver is not sonographically demonstrated, question obscured by bowel gas; recommend follow-up non emergent MR imaging to characterize. Electronically Signed   By: Ulyses Southward M.D.   On: 05/14/2018 17:16        Scheduled Meds: . enoxaparin (LOVENOX) injection  40 mg Subcutaneous Q24H  . potassium chloride  40 mEq Oral Q4H  . sodium chloride flush  3 mL Intravenous Q12H   Continuous Infusions: . sodium chloride    . levofloxacin (LEVAQUIN) IV Stopped (05/15/18 1426)  . sulfamethoxazole-trimethoprim 480 mg (05/16/18 0538)     LOS: 2 days    Time spent: 40 mins     Ramiro Harvest, MD [facility] Pager [phone]  If 7PM-7AM, please contact night-coverage [email or web address] Password [email or web address] 05/16/2018, 12:18 PM

## 2018-05-16 NOTE — Progress Notes (Addendum)
[facility]    Date of Admission:  05/14/2018   Total days of antibiotics 3        Day 3 levofloxacin        Day 2 bactrim           ID: Charles Morrison is a 37 y.o. male with newly diagnosed hiv disease,  Principal Problem:   CAP (community acquired pneumonia) Active Problems:   Hypokalemia   Sepsis (HCC)   Liver mass, left lobe   Anemia   Acute hypoxemic respiratory failure (HCC)   HIV antibody positive (HCC)   Bacteremia due to Gram-positive bacteria   Subjective: Afebrile this morning. Still feeling short of breath with minimal activity. He is maintained on 2L [place]  Medications:  . enoxaparin (LOVENOX) injection  40 mg Subcutaneous Q24H  . sodium chloride flush  3 mL Intravenous Q12H    Objective: Vital signs in last 24 hours: Temp:  [98.6 F (37 C)-99.8 F (37.7 C)] 98.8 F (37.1 C) (08/23 1505) Pulse Rate:  [95-118] 109 (08/23 1505) Resp:  [14-18] 18 (08/23 1505) BP: (111-126)/(72-81) 117/81 (08/23 1505) SpO2:  [94 %-100 %] 97 % (08/23 1505) Physical Exam  Constitutional: He is oriented to person, place, and time. He appears well-developed and well-nourished. No distress.  HENT:  Mouth/Throat: Oropharynx is clear and moist. No oropharyngeal exudate.  Cardiovascular: Normal rate, regular rhythm and normal heart sounds. Exam reveals no gallop and no friction rub.  No murmur heard.  Pulmonary/Chest: Effort normal and breath sounds normal. No respiratory distress. He has no wheezes.  Abdominal: Soft. Bowel sounds are normal. He exhibits no distension. There is no tenderness.  Lymphadenopathy:  He has no cervical adenopathy.  Neurological: He is alert and oriented to person, place, and time.  Skin: Skin is warm and dry. No rash noted. No erythema.  Psychiatric: He has a normal mood and affect. His behavior is normal.     Lab Results Recent Labs    05/15/18 0835 05/16/18 0356  WBC 7.7 11.8*  HGB 9.8* 10.2*  HCT 30.7* 31.9*  NA  143 139  K 3.4* 3.0*  CL 107 103  CO2 28 27  BUN 6 6  CREATININE 0.80 0.75   Liver Panel Recent Labs    05/14/18 1105 05/16/18 0356  PROT 7.9 7.2  ALBUMIN 2.5* 2.3*  AST 40 33  ALT 20 16  ALKPHOS 49 45  BILITOT 0.8 0.6    Microbiology: Cd 4 count of 90 Urine legionella is negaitve Studies/Results: US Abdomen Limited  Result Date: 05/14/2018 CLINICAL DATA:  LEFT lobe liver mass on CT EXAM: ULTRASOUND ABDOMEN LIMITED RIGHT UPPER QUADRANT COMPARISON:  CT chest 05/14/2018 FINDINGS: Gallbladder: Normally distended without stones or wall thickening. No pericholecystic fluid or sonographic Murphy sign. Common bile duct: Diameter: 5 mm diameter, normal Liver: Echogenic parenchyma, likely fatty infiltration though this can be seen with cirrhosis and certain infiltrative disorders. Shadowing obscures portions of the LEFT lobe of the liver. The LEFT lobe abnormality identified on CT chest imaging is not sonographically delineated. Portal vein is patent on color Doppler imaging with normal direction of blood flow towards the liver. No RIGHT upper quadrant free fluid. IMPRESSION: Probable fatty infiltration of liver as above. Abnormality seen on CT in the LEFT lobe of the liver is not sonographically demonstrated, question obscured by bowel gas; recommend follow-up non emergent MR imaging to characterize. Electronically Signed   By: Ulyses Southward M.D.   On:  05/14/2018 17:16     Assessment/Plan: Presumed PCP pneumonia = continue on IV bactrim. I he remains afebrile for next 48rhs, can convert over to orals bactrim. Will discharge levofloxacin since ur legionella is negative  hiv disease = will start on biktarvy today.This will need to take on a daily basis and will need on discharge. Will arrange for follow up in ID clinic in 2-3 wks to ensure he is doing ok with his medications  Partner notification = will discuss with him when he is alone - to whether he has disclosed to his partner and next  steps for testing partner.  [facility]yn[facility] Cell: [phone] Pager: [phone]  05/16/2018, 3:35 PM

## 2018-05-16 NOTE — Care Management (Signed)
CM consult for PCP. This CM explained to pt that the best way to get a PCP with his insurance is to call the number on the back of his insurance card to get a list of providers in his zip code. Pt states he will do this. Sandford Crazeora Delainie Chavana RN,BSN [phone]

## 2018-05-17 DIAGNOSIS — R0902 Hypoxemia: Secondary | ICD-10-CM

## 2018-05-17 DIAGNOSIS — B59 Pneumocystosis: Secondary | ICD-10-CM

## 2018-05-17 DIAGNOSIS — R0600 Dyspnea, unspecified: Secondary | ICD-10-CM

## 2018-05-17 DIAGNOSIS — D649 Anemia, unspecified: Secondary | ICD-10-CM

## 2018-05-17 DIAGNOSIS — R197 Diarrhea, unspecified: Secondary | ICD-10-CM

## 2018-05-17 LAB — CULTURE, BLOOD (ROUTINE X 2): Special Requests: ADEQUATE

## 2018-05-17 LAB — BASIC METABOLIC PANEL
ANION GAP: 10 (ref 5–15)
BUN: 6 mg/dL (ref 6–20)
CHLORIDE: 100 mmol/L (ref 98–111)
CO2: 26 mmol/L (ref 22–32)
Calcium: 8.3 mg/dL — ABNORMAL LOW (ref 8.9–10.3)
Creatinine, Ser: 0.87 mg/dL (ref 0.61–1.24)
GFR calc non Af Amer: >60 mL/min (ref >60)
Glucose, Bld: 113 mg/dL — ABNORMAL HIGH (ref 70–99)
POTASSIUM: 2.8 mmol/L — AB (ref 3.5–5.1)
SODIUM: 136 mmol/L (ref 135–145)

## 2018-05-17 LAB — CBC WITH DIFFERENTIAL/PLATELET
Basophils Absolute: 0 10*3/uL (ref 0.0–0.1)
Basophils Relative: 0 %
EOS ABS: 0 10*3/uL (ref 0.0–0.7)
Eosinophils Relative: 0 %
HEMATOCRIT: 29.5 % — AB (ref 39.0–52.0)
HEMOGLOBIN: 9.5 g/dL — AB (ref 13.0–17.0)
LYMPHS ABS: 1.1 10*3/uL (ref 0.7–4.0)
LYMPHS PCT: 14 %
MCH: 23.7 pg — ABNORMAL LOW (ref 26.0–34.0)
MCHC: 32.2 g/dL (ref 30.0–36.0)
MCV: 73.6 fL — AB (ref 78.0–100.0)
Monocytes Absolute: 0.3 10*3/uL (ref 0.1–1.0)
Monocytes Relative: 4 %
NEUTROS ABS: 6.3 10*3/uL (ref 1.7–7.7)
NEUTROS PCT: 82 %
Platelets: 514 10*3/uL — ABNORMAL HIGH (ref 150–400)
RBC: 4.01 MIL/uL — AB (ref 4.22–5.81)
RDW: 14.8 % (ref 11.5–15.5)
WBC: 7.7 10*3/uL (ref 4.0–10.5)

## 2018-05-17 LAB — MAGNESIUM: MAGNESIUM: 1.8 mg/dL (ref 1.7–2.4)

## 2018-05-17 LAB — OCCULT BLOOD X 1 CARD TO LAB, STOOL
Fecal Occult Bld: NEGATIVE
Fecal Occult Bld: POSITIVE — AB

## 2018-05-17 MED ORDER — SULFAMETHOXAZOLE-TRIMETHOPRIM 800-160 MG PO TABS
2.0000 | ORAL_TABLET | Freq: Three times a day (TID) | ORAL | Status: DC
  Administered 2018-05-17 – 2018-05-20 (×10): 2 via ORAL
  Filled 2018-05-17 (×10): qty 2

## 2018-05-17 MED ORDER — MAGNESIUM SULFATE 2 GM/50ML IV SOLN
2.0000 g | Freq: Once | INTRAVENOUS | Status: AC
  Administered 2018-05-17: 2 g via INTRAVENOUS
  Filled 2018-05-17: qty 50

## 2018-05-17 MED ORDER — PREDNISONE 20 MG PO TABS
40.0000 mg | ORAL_TABLET | Freq: Every day | ORAL | Status: DC
  Administered 2018-05-17 – 2018-05-18 (×2): 40 mg via ORAL
  Filled 2018-05-17 (×2): qty 2

## 2018-05-17 MED ORDER — LIP MEDEX EX OINT
TOPICAL_OINTMENT | CUTANEOUS | Status: AC
  Administered 2018-05-17: 11:00:00
  Filled 2018-05-17: qty 7

## 2018-05-17 MED ORDER — POTASSIUM CHLORIDE CRYS ER 20 MEQ PO TBCR
40.0000 meq | EXTENDED_RELEASE_TABLET | ORAL | Status: AC
  Administered 2018-05-17 (×2): 40 meq via ORAL
  Filled 2018-05-17 (×2): qty 2

## 2018-05-17 NOTE — Progress Notes (Signed)
PROGRESS NOTE    Charles Morrison  ZOX:[id number] DOB: 02/21/1981 DOA: 05/14/2018 PCP: System, Pcp Not In   Brief Narrative:  Patient is a 37 year old gentleman with history of HIV antibody positive noted per lab work from 12/13/2017 which was obtained during his prior hospitalization for reaction to amoxicillin after being started for treatment for strep throat, however patient unaware as results were finalized post discharge who presents to ED with a 3-day history of worsening shortness of breath, productive cough of clear sputum and noted on CT chest and chest x-ray to have bilateral interstitial and alveolar airspace opacities consistent with an acute pneumonic process versus interstitial edema.  Patient also noted to have a fever 100.7 on presentation to the ED as well as hypoxia.  Patient started empirically on IV Levaquin.  1 out of 2 blood cultures positive for gram-positive bacteria and patient also started on IV Levaquin.  ID consulted.   Assessment & Plan:   Principal Problem:   Acute hypoxemic respiratory failure (HCC) Active Problems:   Pneumocystis jiroveci pneumonia (HCC)   HIV antibody positive (HCC)   Hypokalemia   Sepsis (HCC)   Liver mass, left lobe   Anemia  #1 acute hypoxemic respiratory failure secondary to probable/presumed PJP.  Patient had presented with hypoxia, worsening shortness of breath, noted to have a fever with a temperature 100.7 with a tachycardia.  Patient still with fevers.  Patient noted on chart review to have a positive HIV antibody from 12/13/2017 which patient states had no idea her office patient was discharged prior to finalization of results.  Concern for possible opportunistic infection due to history of unknown HIV status.  Patient seen by ID IV vancomycin and IV Levaquin have been discontinued per ID.  Patient was placed on IV Bactrim.  IV Bactrim to be transition to oral Bactrim per ID.  Patient started on prednisone due to ongoing hypoxia per ID.  ID following and appreciate input and recommendations.   #2 HIV antibody positive Noted during prior hospitalization of 12/13/2017.  Patient stated had no idea he was HIV antibody positive.  Patient was likely discharge prior to finalization of results.  It is noted that ID try to contact patient however that was unsuccessful and ID subsequently informed the health department per prior note.  Repeat HIV antibody positive and reactive.  RPR negative.  Hep B surface antigen negative.  Hepatitis C antibody 0.1.  CD4 count of 90.  Viral load of 154,000.  Patient has been started on biktarvy per ID.  ID following and patient to be started on therapy.  Per ID.   3.  1/4 bacteremia gram-positive/coagulase-negative staph likely contaminant Patient seen by ID 1/4 bottles with staph species coagulase-negative staph likely contaminant.  IV vancomycin has been discontinued per ID.  ID following.   4.  Hypokalemia Potassium at 2.8.  Replete.  Magnesium at 1.8.  Follow.  5.  Anemia Likely anemia of chronic disease as patient with iron level of 15, ferritin of 232 with a TIBC of 171.  No overt bleeding.  Hemoglobin is stable at 9.5.  Follow H&H.  6 liver mass/left lobe Area of decreased attenuation noted within the left lobe of the liver.  Abdominal ultrasound done with probable fatty infiltration of the liver.  May need nonemergent MR imaging for follow-up.   DVT prophylaxis: Lovenox Code Status: Full Family Communication: Updated patient at bedside.  No family present. Disposition Plan: To be determined.   Consultants:   Infectious diseases: Dr. Drue SecondSnider  05/15/2018  Procedures:   CT angiogram chest 05/14/2018  Chest x-ray 05/14/2018  Abdominal ultrasound 05/14/2018  Antimicrobials:   IV Levaquin 05/14/2018>>>>>> 05/16/2018  IV vancomycin 05/14/2018>>>>>> 05/15/2018  IV Bactrim 05/15/2018   Subjective: Patient sitting up in bed.  Patient states shortness of breath on minimal exertion to the  bathroom.  Patient noted to be still hypoxic with sats of 91% 1.5 L nasal cannula.  Patient states some improvement with his cough.  Chills improving.    Objective: Vitals:   05/17/18 0828 05/17/18 1124 05/17/18 1215 05/17/18 1422  BP:    112/73  Pulse:    95  Resp:    20  Temp:  (!) 100.6 F (38.1 C) 99.3 F (37.4 C) 98.9 F (37.2 C)  TempSrc:  Oral Oral Oral  SpO2: 91%   94%  Weight:      Height:        Intake/Output Summary (Last 24 hours) at 05/17/2018 1727 Last data filed at 05/17/2018 1400 Gross per 24 hour  Intake 1275.47 ml  Output -  Net 1275.47 ml   Filed Weights   05/14/18 1810 05/16/18 2100 05/17/18 0443  Weight: 90.7 kg 90.8 kg 91.4 kg    Examination:  General exam: NAD. Respiratory system: Scattered diffuse coarse breath sounds.  Some crackles noted.  No wheezing.  No rhonchi. Normal respiratory effort.  Cardiovascular system: Regular rate rhythm no murmurs rubs or gallops.  No JVD.  No lower extremity edema. Gastrointestinal system: Abdomen is soft, nontender, nondistended, positive bowel sounds.  No rebound.  No guarding.  Central nervous system: Alert and oriented. No focal neurological deficits. Extremities: Symmetric 5 x 5 power. Skin: No rashes, lesions or ulcers Psychiatry: Judgement and insight appear normal. Mood & affect appropriate.     Data Reviewed: I have personally reviewed following labs and imaging studies  CBC: Recent Labs  Lab 05/14/18 1105 05/15/18 0835 05/16/18 0356 05/17/18 0957  WBC 7.9 7.7 11.8* 7.7  NEUTROABS 6.0  --  10.3* 6.3  HGB 10.6* 9.8* 10.2* 9.5*  HCT 32.5* 30.7* 31.9* 29.5*  MCV 75.8* 76.4* 75.1* 73.6*  PLT 546* 528* 500* 514*   Basic Metabolic Panel: Recent Labs  Lab 05/14/18 1105 05/15/18 0835 05/16/18 0356 05/17/18 0957  NA 141 143 139 136  K 2.7* 3.4* 3.0* 2.8*  CL 101 107 103 100  CO2 26 28 27 26   GLUCOSE 101* 106* 104* 113*  BUN 9 6 6 6   CREATININE 0.82 0.80 0.75 0.87  CALCIUM 8.3* 8.3* 8.4*  8.3*  MG 2.2  --  1.9 1.8   GFR: Estimated Creatinine Clearance: 132.2 mL/min (by C-G formula based on SCr of 0.87 mg/dL). Liver Function Tests: Recent Labs  Lab 05/14/18 1105 05/16/18 0356  AST 40 33  ALT 20 16  ALKPHOS 49 45  BILITOT 0.8 0.6  PROT 7.9 7.2  ALBUMIN 2.5* 2.3*   Recent Labs  Lab 05/14/18 1105  LIPASE 22   No results for input(s): AMMONIA in the last 168 hours. Coagulation Profile: No results for input(s): INR, PROTIME in the last 168 hours. Cardiac Enzymes: No results for input(s): CKTOTAL, CKMB, CKMBINDEX, TROPONINI in the last 168 hours. BNP (last 3 results) No results for input(s): PROBNP in the last 8760 hours. HbA1C: No results for input(s): HGBA1C in the last 72 hours. CBG: No results for input(s): GLUCAP in the last 168 hours. Lipid Profile: No results for input(s): CHOL, HDL, LDLCALC, TRIG, CHOLHDL, LDLDIRECT in the last 72 hours. Thyroid  Function Tests: No results for input(s): TSH, T4TOTAL, FREET4, T3FREE, THYROIDAB in the last 72 hours. Anemia Panel: Recent Labs    05/14/18 1800 05/15/18 0409  VITAMINB12  --  381  FOLATE  --  13.6  FERRITIN 232  --   TIBC 171*  --   IRON 15*  --   RETICCTPCT 0.6  --    Sepsis Labs: Recent Labs  Lab 05/14/18 1109  LATICACIDVEN 1.27    Recent Results (from the past 240 hour(s))  Blood Culture (routine x 2)     Status: None (Preliminary result)   Collection Time: 05/14/18 11:07 AM  Result Value Ref Range Status   Specimen Description   Final    BLOOD LEFT ANTECUBITAL Performed at [facility], [address], [place], [place] [address]    Special Requests   Final    BOTTLES DRAWN AEROBIC AND ANAEROBIC Blood Culture adequate volume Performed at [facility], [address] [place], [place] [address]    Culture   Final    NO GROWTH 3 DAYS Performed at [facility], [address], [place], [place] [address]    Report Status PENDING  Incomplete    Blood Culture (routine x 2)     Status: Abnormal   Collection Time: 05/14/18 11:07 AM  Result Value Ref Range Status   Specimen Description   Final    BLOOD RIGHT ANTECUBITAL Performed at [facility], [address] [place], [place] [address]    Special Requests   Final    BOTTLES DRAWN AEROBIC AND ANAEROBIC Blood Culture adequate volume Performed at [facility], [address] [place], [place] [address]    Culture  Setup Time   Final    GRAM POSITIVE COCCI IN CLUSTERS AEROBIC BOTTLE ONLY CRITICAL RESULT CALLED TO, READ BACK BY AND VERIFIED WITH: PHARMD C BARMAN [id number] AT 1208 BY CM    Culture (A)  Final    STAPHYLOCOCCUS SPECIES (COAGULASE NEGATIVE) THE SIGNIFICANCE OF ISOLATING THIS ORGANISM FROM A SINGLE SET OF BLOOD CULTURES WHEN MULTIPLE SETS ARE DRAWN IS UNCERTAIN. PLEASE NOTIFY THE MICROBIOLOGY DEPARTMENT WITHIN ONE WEEK IF SPECIATION AND SENSITIVITIES ARE REQUIRED. Performed at [facility], [address], [place], [place] [address]    Report Status 05/17/2018 FINAL  Final  Blood Culture ID Panel (Reflexed)     Status: Abnormal   Collection Time: 05/14/18 11:07 AM  Result Value Ref Range Status   Enterococcus species NOT DETECTED NOT DETECTED Final   Listeria monocytogenes NOT DETECTED NOT DETECTED Final   Staphylococcus species DETECTED (A) NOT DETECTED Final    Comment: Methicillin (oxacillin) resistant coagulase negative staphylococcus. Possible blood culture contaminant (unless isolated from more than one blood culture draw or clinical case suggests pathogenicity). No antibiotic treatment is indicated for blood  culture contaminants. CRITICAL RESULT CALLED TO, READ BACK BY AND VERIFIED WITH: PHARMD C BARMAN [id number] AT 1208 BY CM    Staphylococcus aureus NOT DETECTED NOT DETECTED Final   Methicillin resistance DETECTED (A) NOT DETECTED Final    Comment: CRITICAL RESULT CALLED TO, READ BACK BY AND VERIFIED WITH: PHARMD C  BARMAN 0822/19 AT 1208 BY CM    Streptococcus species NOT DETECTED NOT DETECTED Final   Streptococcus agalactiae NOT DETECTED NOT DETECTED Final   Streptococcus pneumoniae NOT DETECTED NOT DETECTED Final   Streptococcus pyogenes NOT DETECTED NOT DETECTED Final   Acinetobacter baumannii NOT DETECTED NOT DETECTED Final  Enterobacteriaceae species NOT DETECTED NOT DETECTED Final   Enterobacter cloacae complex NOT DETECTED NOT DETECTED Final   Escherichia coli NOT DETECTED NOT DETECTED Final   Klebsiella oxytoca NOT DETECTED NOT DETECTED Final   Klebsiella pneumoniae NOT DETECTED NOT DETECTED Final   Proteus species NOT DETECTED NOT DETECTED Final   Serratia marcescens NOT DETECTED NOT DETECTED Final   Haemophilus influenzae NOT DETECTED NOT DETECTED Final   Neisseria meningitidis NOT DETECTED NOT DETECTED Final   Pseudomonas aeruginosa NOT DETECTED NOT DETECTED Final   Candida albicans NOT DETECTED NOT DETECTED Final   Candida glabrata NOT DETECTED NOT DETECTED Final   Candida krusei NOT DETECTED NOT DETECTED Final   Candida parapsilosis NOT DETECTED NOT DETECTED Final   Candida tropicalis NOT DETECTED NOT DETECTED Final    Comment: Performed at [facility], [address], [place], [place] [address]  Respiratory Panel by PCR     Status: None   Collection Time: 05/15/18  2:56 AM  Result Value Ref Range Status   Adenovirus NOT DETECTED NOT DETECTED Final   Coronavirus 229E NOT DETECTED NOT DETECTED Final   Coronavirus HKU1 NOT DETECTED NOT DETECTED Final   Coronavirus NL63 NOT DETECTED NOT DETECTED Final   Coronavirus OC43 NOT DETECTED NOT DETECTED Final   Metapneumovirus NOT DETECTED NOT DETECTED Final   Rhinovirus / Enterovirus NOT DETECTED NOT DETECTED Final   Influenza A NOT DETECTED NOT DETECTED Final   Influenza B NOT DETECTED NOT DETECTED Final   Parainfluenza Virus 1 NOT DETECTED NOT DETECTED Final   Parainfluenza Virus 2 NOT DETECTED NOT DETECTED Final    Parainfluenza Virus 3 NOT DETECTED NOT DETECTED Final   Parainfluenza Virus 4 NOT DETECTED NOT DETECTED Final   Respiratory Syncytial Virus NOT DETECTED NOT DETECTED Final   Bordetella pertussis NOT DETECTED NOT DETECTED Final   Chlamydophila pneumoniae NOT DETECTED NOT DETECTED Final   Mycoplasma pneumoniae NOT DETECTED NOT DETECTED Final    Comment: Performed at [facility], [address] [place], [place] [address]         Radiology Studies: No results found.      Scheduled Meds: . bictegravir-emtricitabine-tenofovir AF  1 tablet Oral Daily  . enoxaparin (LOVENOX) injection  40 mg Subcutaneous Q24H  . guaiFENesin  1,200 mg Oral BID  . ipratropium-albuterol  3 mL Nebulization BID  . potassium chloride  40 mEq Oral Q4H  . predniSONE  40 mg Oral Q breakfast  . sodium chloride flush  3 mL Intravenous Q12H  . sulfamethoxazole-trimethoprim  2 tablet Oral Q8H   Continuous Infusions: . sodium chloride Stopped (05/17/18 1611)  . magnesium sulfate 1 - 4 g bolus IVPB       LOS: 3 days    Time spent: 40 mins     Ramiro Harvest, MD [facility] Pager [phone]  If 7PM-7AM, please contact night-coverage [email or web address] Password [facility] 05/17/2018, 5:27 PM

## 2018-05-17 NOTE — Progress Notes (Signed)
[facility]   Reason for visit: Follow up on probable PJP pneumonia  Interval History: significant DOE when walking to bathroom, remains hypoxic with O2 at 91% on [place]; no fever, wbc wnl.  + po.  Some loose stools.     Physical Exam: Constitutional:  Vitals:   05/17/18 0828 05/17/18 1124  BP:    Pulse:    Resp:    Temp:  (!) 100.6 F (38.1 C)  SpO2: 91%    patient appears in NAD in bed Eyes: anicteric HENT: no thrush Respiratory: Normal respiratory effort at rest, some rhonchi diffusely Cardiovascular: RRR GI: soft, nt, nd  Review of Systems: Constitutional: negative for fevers and chills Gastrointestinal: negative for nausea and diarrhea Integument/breast: negative for rash  Lab Results  Component Value Date   WBC 7.7 05/17/2018   HGB 9.5 (L) 05/17/2018   HCT 29.5 (L) 05/17/2018   MCV 73.6 (L) 05/17/2018   PLT 514 (H) 05/17/2018    Lab Results  Component Value Date   CREATININE 0.87 05/17/2018   BUN 6 05/17/2018   NA 136 05/17/2018   K 2.8 (L) 05/17/2018   CL 100 05/17/2018   CO2 26 05/17/2018    Lab Results  Component Value Date   ALT 16 05/16/2018   AST 33 05/16/2018   ALKPHOS 45 05/16/2018     Microbiology: Recent Results (from the past 240 hour(s))  Blood Culture (routine x 2)     Status: None (Preliminary result)   Collection Time: 05/14/18 11:07 AM  Result Value Ref Range Status   Specimen Description   Final    BLOOD LEFT ANTECUBITAL Performed at [facility], [address] [place], [place] [address]    Special Requests   Final    BOTTLES DRAWN AEROBIC AND ANAEROBIC Blood Culture adequate volume Performed at [facility], [address] [place], [place] [address]    Culture   Final    NO GROWTH 3 DAYS Performed at [facility], [address] [place], [place] [address]    Report Status PENDING  Incomplete  Blood Culture (routine x 2)     Status: Abnormal   Collection Time: 05/14/18 11:07 AM  Result Value Ref Range Status   Specimen Description   Final    BLOOD RIGHT ANTECUBITAL Performed at [facility], [address], [place], [place] [address]    Special Requests   Final    BOTTLES DRAWN AEROBIC AND ANAEROBIC Blood Culture adequate volume Performed at [facility], [address], [place], [place] [address]    Culture  Setup Time   Final    GRAM POSITIVE COCCI IN CLUSTERS AEROBIC BOTTLE ONLY CRITICAL RESULT CALLED TO, READ BACK BY AND VERIFIED WITH: PHARMD C BARMAN [id number] AT 1208 BY CM    Culture (A)  Final    STAPHYLOCOCCUS SPECIES (COAGULASE NEGATIVE) THE SIGNIFICANCE OF ISOLATING THIS ORGANISM FROM A SINGLE SET OF BLOOD CULTURES WHEN MULTIPLE SETS ARE DRAWN IS UNCERTAIN. PLEASE NOTIFY THE MICROBIOLOGY DEPARTMENT WITHIN ONE WEEK IF SPECIATION AND SENSITIVITIES ARE REQUIRED. Performed at [facility], [address] [place], [place] [address]    Report Status 05/17/2018 FINAL  Final  Blood Culture ID Panel (Reflexed)     Status: Abnormal   Collection Time: 05/14/18 11:07 AM  Result Value Ref Range Status   Enterococcus species NOT DETECTED NOT DETECTED Final   Listeria monocytogenes NOT DETECTED NOT  DETECTED Final   Staphylococcus species DETECTED (A) NOT DETECTED Final    Comment: Methicillin (oxacillin) resistant coagulase negative staphylococcus. Possible blood culture contaminant (unless isolated from more than one blood culture draw or clinical case suggests pathogenicity). No antibiotic treatment is indicated for blood  culture contaminants. CRITICAL RESULT CALLED TO, READ BACK BY AND VERIFIED WITH: PHARMD C BARMAN [id number] AT 1208 BY CM    Staphylococcus aureus NOT DETECTED NOT DETECTED Final   Methicillin resistance DETECTED (A) NOT DETECTED Final    Comment: CRITICAL RESULT CALLED TO, READ BACK BY AND VERIFIED WITH: PHARMD C BARMAN 0822/19 AT 1208 BY CM    Streptococcus  species NOT DETECTED NOT DETECTED Final   Streptococcus agalactiae NOT DETECTED NOT DETECTED Final   Streptococcus pneumoniae NOT DETECTED NOT DETECTED Final   Streptococcus pyogenes NOT DETECTED NOT DETECTED Final   Acinetobacter baumannii NOT DETECTED NOT DETECTED Final   Enterobacteriaceae species NOT DETECTED NOT DETECTED Final   Enterobacter cloacae complex NOT DETECTED NOT DETECTED Final   Escherichia coli NOT DETECTED NOT DETECTED Final   Klebsiella oxytoca NOT DETECTED NOT DETECTED Final   Klebsiella pneumoniae NOT DETECTED NOT DETECTED Final   Proteus species NOT DETECTED NOT DETECTED Final   Serratia marcescens NOT DETECTED NOT DETECTED Final   Haemophilus influenzae NOT DETECTED NOT DETECTED Final   Neisseria meningitidis NOT DETECTED NOT DETECTED Final   Pseudomonas aeruginosa NOT DETECTED NOT DETECTED Final   Candida albicans NOT DETECTED NOT DETECTED Final   Candida glabrata NOT DETECTED NOT DETECTED Final   Candida krusei NOT DETECTED NOT DETECTED Final   Candida parapsilosis NOT DETECTED NOT DETECTED Final   Candida tropicalis NOT DETECTED NOT DETECTED Final    Comment: Performed at [facility], [address] [place], [place] [address]  Respiratory Panel by PCR     Status: None   Collection Time: 05/15/18  2:56 AM  Result Value Ref Range Status   Adenovirus NOT DETECTED NOT DETECTED Final   Coronavirus 229E NOT DETECTED NOT DETECTED Final   Coronavirus HKU1 NOT DETECTED NOT DETECTED Final   Coronavirus NL63 NOT DETECTED NOT DETECTED Final   Coronavirus OC43 NOT DETECTED NOT DETECTED Final   Metapneumovirus NOT DETECTED NOT DETECTED Final   Rhinovirus / Enterovirus NOT DETECTED NOT DETECTED Final   Influenza A NOT DETECTED NOT DETECTED Final   Influenza B NOT DETECTED NOT DETECTED Final   Parainfluenza Virus 1 NOT DETECTED NOT DETECTED Final   Parainfluenza Virus 2 NOT DETECTED NOT DETECTED Final   Parainfluenza Virus 3 NOT DETECTED NOT DETECTED Final    Parainfluenza Virus 4 NOT DETECTED NOT DETECTED Final   Respiratory Syncytial Virus NOT DETECTED NOT DETECTED Final   Bordetella pertussis NOT DETECTED NOT DETECTED Final   Chlamydophila pneumoniae NOT DETECTED NOT DETECTED Final   Mycoplasma pneumoniae NOT DETECTED NOT DETECTED Final    Comment: Performed at [facility], [address] [place], [place] [address]    Impression/Plan:  1. Probable PJP pneumonia - DFA not sent.  On IV bactrim.  CT and clinical course (DOE, hypoxia, CD4 count) most c/w PJP.   With persistent hypoxia, I will add steroids (will start with 40 mg daily for 5 days then 20 mg daily for 11 days).   I will convert Bactrim to oral  2.  HIV - on biktarvy and no issues.  Has Biktarvy and has clinic follow up.    3.  Loose stools - about 3-4, not c/w  C diff.

## 2018-05-18 LAB — BASIC METABOLIC PANEL
Anion gap: 10 (ref 5–15)
BUN: 8 mg/dL (ref 6–20)
CALCIUM: 8.7 mg/dL — AB (ref 8.9–10.3)
CO2: 28 mmol/L (ref 22–32)
Chloride: 102 mmol/L (ref 98–111)
Creatinine, Ser: 0.81 mg/dL (ref 0.61–1.24)
GFR calc Af Amer: >60 mL/min (ref >60)
GLUCOSE: 113 mg/dL — AB (ref 70–99)
Potassium: 3.7 mmol/L (ref 3.5–5.1)
Sodium: 140 mmol/L (ref 135–145)

## 2018-05-18 LAB — CBC WITH DIFFERENTIAL/PLATELET
BASOS ABS: 0 10*3/uL (ref 0.0–0.1)
BASOS PCT: 0 %
EOS PCT: 0 %
Eosinophils Absolute: 0 10*3/uL (ref 0.0–0.7)
HEMATOCRIT: 30.6 % — AB (ref 39.0–52.0)
Hemoglobin: 10.3 g/dL — ABNORMAL LOW (ref 13.0–17.0)
LYMPHS PCT: 8 %
Lymphs Abs: 0.8 10*3/uL (ref 0.7–4.0)
MCH: 24.3 pg — ABNORMAL LOW (ref 26.0–34.0)
MCHC: 33.7 g/dL (ref 30.0–36.0)
MCV: 72.3 fL — AB (ref 78.0–100.0)
MONO ABS: 0.3 10*3/uL (ref 0.1–1.0)
MONOS PCT: 3 %
NEUTROS ABS: 9.4 10*3/uL — AB (ref 1.7–7.7)
Neutrophils Relative %: 89 %
PLATELETS: 615 10*3/uL — AB (ref 150–400)
RBC: 4.23 MIL/uL (ref 4.22–5.81)
RDW: 14.9 % (ref 11.5–15.5)
WBC: 10.6 10*3/uL — ABNORMAL HIGH (ref 4.0–10.5)

## 2018-05-18 LAB — OCCULT BLOOD X 1 CARD TO LAB, STOOL: FECAL OCCULT BLD: NEGATIVE

## 2018-05-18 LAB — MAGNESIUM: MAGNESIUM: 2.2 mg/dL (ref 1.7–2.4)

## 2018-05-18 MED ORDER — PREDNISONE 20 MG PO TABS
40.0000 mg | ORAL_TABLET | Freq: Every day | ORAL | Status: DC
  Administered 2018-05-19 – 2018-05-20 (×2): 40 mg via ORAL
  Filled 2018-05-18 (×2): qty 2

## 2018-05-18 MED ORDER — PREDNISONE 20 MG PO TABS: 20.0000 mg | ORAL_TABLET | Freq: Every day | ORAL | Status: DC

## 2018-05-18 NOTE — Progress Notes (Signed)
[facility]   Reason for visit: Follow up on probable PJP pneumonia  Interval History: still with significant DOE when walking to bathroom, remains hypoxic with O2 at 91% on [place]; no fever.  Steroids started yesterday   Physical Exam: Constitutional:  Vitals:   05/18/18 0617 05/18/18 0726  BP: 122/69   Pulse: (!) 105   Resp: 15   Temp: 98.1 F (36.7 C)   SpO2: 97% 91%   patient appears in NAD in bed Eyes: anicteric HENT: no thrush Respiratory: Normal respiratory effort at rest, some wheezing Cardiovascular: RRR GI: soft, nt, nd  Review of Systems: Constitutional: negative for fevers and chills Gastrointestinal: negative for nausea, some loose stools Integument/breast: negative for rash  Lab Results  Component Value Date   WBC 10.6 (H) 05/18/2018   HGB 10.3 (L) 05/18/2018   HCT 30.6 (L) 05/18/2018   MCV 72.3 (L) 05/18/2018   PLT 615 (H) 05/18/2018    Lab Results  Component Value Date   CREATININE 0.81 05/18/2018   BUN 8 05/18/2018   NA 140 05/18/2018   K 3.7 05/18/2018   CL 102 05/18/2018   CO2 28 05/18/2018    Lab Results  Component Value Date   ALT 16 05/16/2018   AST 33 05/16/2018   ALKPHOS 45 05/16/2018     Microbiology: Recent Results (from the past 240 hour(s))  Blood Culture (routine x 2)     Status: None (Preliminary result)   Collection Time: 05/14/18 11:07 AM  Result Value Ref Range Status   Specimen Description   Final    BLOOD LEFT ANTECUBITAL Performed at [facility], [address] [place], [place] [address]    Special Requests   Final    BOTTLES DRAWN AEROBIC AND ANAEROBIC Blood Culture adequate volume Performed at [facility], [address] [place], [place] [address]    Culture   Final    NO GROWTH 3 DAYS Performed at [facility], [address] [place], [place] [address]    Report Status PENDING  Incomplete  Blood Culture (routine x 2)     Status:  Abnormal   Collection Time: 05/14/18 11:07 AM  Result Value Ref Range Status   Specimen Description   Final    BLOOD RIGHT ANTECUBITAL Performed at [facility], [address] [place], [place] [address]    Special Requests   Final    BOTTLES DRAWN AEROBIC AND ANAEROBIC Blood Culture adequate volume Performed at [facility], [address] [place], [place] [address]    Culture  Setup Time   Final    GRAM POSITIVE COCCI IN CLUSTERS AEROBIC BOTTLE ONLY CRITICAL RESULT CALLED TO, READ BACK BY AND VERIFIED WITH: PHARMD C BARMAN [id number] AT 1208 BY CM    Culture (A)  Final    STAPHYLOCOCCUS SPECIES (COAGULASE NEGATIVE) THE SIGNIFICANCE OF ISOLATING THIS ORGANISM FROM A SINGLE SET OF BLOOD CULTURES WHEN MULTIPLE SETS ARE DRAWN IS UNCERTAIN. PLEASE NOTIFY THE MICROBIOLOGY DEPARTMENT WITHIN ONE WEEK IF SPECIATION AND SENSITIVITIES ARE REQUIRED. Performed at [facility], [address] [place], [place] [address]    Report Status 05/17/2018 FINAL  Final  Blood Culture ID Panel (Reflexed)     Status: Abnormal   Collection Time: 05/14/18 11:07 AM  Result Value Ref Range Status   Enterococcus species NOT DETECTED NOT DETECTED Final   Listeria monocytogenes NOT DETECTED NOT DETECTED Final  Staphylococcus species DETECTED (A) NOT DETECTED Final    Comment: Methicillin (oxacillin) resistant coagulase negative staphylococcus. Possible blood culture contaminant (unless isolated from more than one blood culture draw or clinical case suggests pathogenicity). No antibiotic treatment is indicated for blood  culture contaminants. CRITICAL RESULT CALLED TO, READ BACK BY AND VERIFIED WITH: PHARMD C BARMAN [id number] AT 1208 BY CM    Staphylococcus aureus NOT DETECTED NOT DETECTED Final   Methicillin resistance DETECTED (A) NOT DETECTED Final    Comment: CRITICAL RESULT CALLED TO, READ BACK BY AND VERIFIED WITH: PHARMD C BARMAN 0822/19 AT 1208 BY CM     Streptococcus species NOT DETECTED NOT DETECTED Final   Streptococcus agalactiae NOT DETECTED NOT DETECTED Final   Streptococcus pneumoniae NOT DETECTED NOT DETECTED Final   Streptococcus pyogenes NOT DETECTED NOT DETECTED Final   Acinetobacter baumannii NOT DETECTED NOT DETECTED Final   Enterobacteriaceae species NOT DETECTED NOT DETECTED Final   Enterobacter cloacae complex NOT DETECTED NOT DETECTED Final   Escherichia coli NOT DETECTED NOT DETECTED Final   Klebsiella oxytoca NOT DETECTED NOT DETECTED Final   Klebsiella pneumoniae NOT DETECTED NOT DETECTED Final   Proteus species NOT DETECTED NOT DETECTED Final   Serratia marcescens NOT DETECTED NOT DETECTED Final   Haemophilus influenzae NOT DETECTED NOT DETECTED Final   Neisseria meningitidis NOT DETECTED NOT DETECTED Final   Pseudomonas aeruginosa NOT DETECTED NOT DETECTED Final   Candida albicans NOT DETECTED NOT DETECTED Final   Candida glabrata NOT DETECTED NOT DETECTED Final   Candida krusei NOT DETECTED NOT DETECTED Final   Candida parapsilosis NOT DETECTED NOT DETECTED Final   Candida tropicalis NOT DETECTED NOT DETECTED Final    Comment: Performed at [facility], [address] [place], [place] [address]  Respiratory Panel by PCR     Status: None   Collection Time: 05/15/18  2:56 AM  Result Value Ref Range Status   Adenovirus NOT DETECTED NOT DETECTED Final   Coronavirus 229E NOT DETECTED NOT DETECTED Final   Coronavirus HKU1 NOT DETECTED NOT DETECTED Final   Coronavirus NL63 NOT DETECTED NOT DETECTED Final   Coronavirus OC43 NOT DETECTED NOT DETECTED Final   Metapneumovirus NOT DETECTED NOT DETECTED Final   Rhinovirus / Enterovirus NOT DETECTED NOT DETECTED Final   Influenza A NOT DETECTED NOT DETECTED Final   Influenza B NOT DETECTED NOT DETECTED Final   Parainfluenza Virus 1 NOT DETECTED NOT DETECTED Final   Parainfluenza Virus 2 NOT DETECTED NOT DETECTED Final   Parainfluenza Virus 3 NOT DETECTED NOT  DETECTED Final   Parainfluenza Virus 4 NOT DETECTED NOT DETECTED Final   Respiratory Syncytial Virus NOT DETECTED NOT DETECTED Final   Bordetella pertussis NOT DETECTED NOT DETECTED Final   Chlamydophila pneumoniae NOT DETECTED NOT DETECTED Final   Mycoplasma pneumoniae NOT DETECTED NOT DETECTED Final    Comment: Performed at [facility], [address], [place], [place] [address]    Impression/Plan:  1. 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    2.  HIV - on biktarvy and no issues.  Has Biktarvy and has clinic follow up.    Dr. Drue Second back tomorrow.

## 2018-05-18 NOTE — Progress Notes (Signed)
PROGRESS NOTE    Charles Morrison  ZOX:[id number] DOB: Mar 19, 1981 DOA: 05/14/2018 PCP: System, Pcp Not In   Brief Narrative:  Patient is a 37 year old gentleman with history of HIV antibody positive noted per lab work from 12/13/2017 which was obtained during his prior hospitalization for reaction to amoxicillin after being started for treatment for strep throat, however patient unaware as results were finalized post discharge who presents to ED with a 3-day history of worsening shortness of breath, productive cough of clear sputum and noted on CT chest and chest x-ray to have bilateral interstitial and alveolar airspace opacities consistent with an acute pneumonic process versus interstitial edema.  Patient also noted to have a fever 100.7 on presentation to the ED as well as hypoxia.  Patient started empirically on IV Levaquin.  1 out of 2 blood cultures positive for gram-positive bacteria and patient also started on IV Levaquin.  ID consulted.   Assessment & Plan:   Principal Problem:   Acute hypoxemic respiratory failure (HCC) Active Problems:   Pneumocystis jiroveci pneumonia (HCC)   HIV antibody positive (HCC)   Hypokalemia   Sepsis (HCC)   Liver mass, left lobe   Anemia   Hypoxemia  #1 acute hypoxemic respiratory failure secondary to probable/presumed PJP.  Patient had presented with hypoxia, worsening shortness of breath, noted to have a fever with a temperature 100.7 with a tachycardia.  Patient still with fevers.  Patient noted on chart review to have a positive HIV antibody from 12/13/2017 which patient states had no idea her office patient was discharged prior to finalization of results.  Concern for possible opportunistic infection due to history of unknown HIV status.  Patient seen by ID, IV vancomycin and IV Levaquin have been discontinued per ID.  Patient was placed on IV Bactrim and has been subsequently transitioned to oral Bactrim per ID.  Prednisone started due to ongoing  hypoxia.  Continue neb treatments. ID following and appreciate input and recommendations.   #2 HIV antibody positive Noted during prior hospitalization of 12/13/2017.  Patient stated had no idea he was HIV antibody positive.  Patient was likely discharge prior to finalization of results.  It is noted that ID try to contact patient however that was unsuccessful and ID subsequently informed the health department per prior note.  Repeat HIV antibody positive and reactive.  RPR negative.  Hep B surface antigen negative.  Hepatitis C antibody 0.1.  CD4 count of 90.  Viral load of 154,000.  Patient has been started on biktarvy per ID.  ID following and patient to be started on therapy.  Per ID.   3.  1/4 bacteremia gram-positive/coagulase-negative staph likely contaminant Patient seen by ID 1/4 bottles with staph species coagulase-negative staph likely contaminant.  IV vancomycin has been discontinued per ID.  ID following.   4.  Hypokalemia Repleted.  Magnesium at 2.2.  Follow.    5.  Anemia of chronic disease Likely anemia of chronic disease as patient with iron level of 15, ferritin of 232 with a TIBC of 171.  No overt bleeding.  Hemoglobin is stable at 10.3.  Follow H&H.  6 liver mass/left lobe Area of decreased attenuation noted within the left lobe of the liver.  Abdominal ultrasound done with probable fatty infiltration of the liver.  May need nonemergent MR imaging for follow-up.   DVT prophylaxis: Lovenox Code Status: Full Family Communication: Updated patient at bedside.  No family present. Disposition Plan: Likely home once clinically improved and when okay with  ID.     Consultants:   Infectious diseases: Dr. Drue Second 05/15/2018  Procedures:   CT angiogram chest 05/14/2018  Chest x-ray 05/14/2018  Abdominal ultrasound 05/14/2018  Antimicrobials:   IV Levaquin 05/14/2018>>>>>> 05/16/2018  IV vancomycin 05/14/2018>>>>>> 05/15/2018  IV Bactrim 05/15/2018   Subjective: Patient in  bed.  Still with complaints of shortness of breath on minimal exertion to the bathroom.  No chest pain.  States cough is improving.  Chills improving.   Objective: Vitals:   05/17/18 2003 05/17/18 2158 05/18/18 0617 05/18/18 0726  BP:  108/60 122/69   Pulse:  93 (!) 105   Resp:  16 15   Temp:  (!) 97.5 F (36.4 C) 98.1 F (36.7 C)   TempSrc:  Oral Oral   SpO2: (!) 88% 92% 97% 91%  Weight:   88.3 kg   Height:        Intake/Output Summary (Last 24 hours) at 05/18/2018 1129 Last data filed at 05/18/2018 0100 Gross per 24 hour  Intake 294.94 ml  Output -  Net 294.94 ml   Filed Weights   05/16/18 2100 05/17/18 0443 05/18/18 0617  Weight: 90.8 kg 91.4 kg 88.3 kg    Examination:  General exam: NAD. Respiratory system: Diffuse coarse breath sounds.  Some crackles noted.  No wheezing.  No rhonchi.  Normal respiratory effort.  Cardiovascular system: RRR no murmurs rubs or gallops.  No JVD.  No lower extremity edema.  Gastrointestinal system: Abdomen is nontender, nondistended, soft, positive bowel sounds.  No rebound.  No guarding.  Central nervous system: Alert and oriented. No focal neurological deficits. Extremities: Symmetric 5 x 5 power. Skin: No rashes, lesions or ulcers Psychiatry: Judgement and insight appear normal. Mood & affect appropriate.     Data Reviewed: I have personally reviewed following labs and imaging studies  CBC: Recent Labs  Lab 05/14/18 1105 05/15/18 0835 05/16/18 0356 05/17/18 0957 05/18/18 0600  WBC 7.9 7.7 11.8* 7.7 10.6*  NEUTROABS 6.0  --  10.3* 6.3 9.4*  HGB 10.6* 9.8* 10.2* 9.5* 10.3*  HCT 32.5* 30.7* 31.9* 29.5* 30.6*  MCV 75.8* 76.4* 75.1* 73.6* 72.3*  PLT 546* 528* 500* 514* 615*   Basic Metabolic Panel: Recent Labs  Lab 05/14/18 1105 05/15/18 0835 05/16/18 0356 05/17/18 0957 05/18/18 0600  NA 141 143 139 136 140  K 2.7* 3.4* 3.0* 2.8* 3.7  CL 101 107 103 100 102  CO2 26 28 27 26 28   GLUCOSE 101* 106* 104* 113* 113*  BUN  9 6 6 6 8   CREATININE 0.82 0.80 0.75 0.87 0.81  CALCIUM 8.3* 8.3* 8.4* 8.3* 8.7*  MG 2.2  --  1.9 1.8 2.2   GFR: Estimated Creatinine Clearance: 139.7 mL/min (by C-G formula based on SCr of 0.81 mg/dL). Liver Function Tests: Recent Labs  Lab 05/14/18 1105 05/16/18 0356  AST 40 33  ALT 20 16  ALKPHOS 49 45  BILITOT 0.8 0.6  PROT 7.9 7.2  ALBUMIN 2.5* 2.3*   Recent Labs  Lab 05/14/18 1105  LIPASE 22   No results for input(s): AMMONIA in the last 168 hours. Coagulation Profile: No results for input(s): INR, PROTIME in the last 168 hours. Cardiac Enzymes: No results for input(s): CKTOTAL, CKMB, CKMBINDEX, TROPONINI in the last 168 hours. BNP (last 3 results) No results for input(s): PROBNP in the last 8760 hours. HbA1C: No results for input(s): HGBA1C in the last 72 hours. CBG: No results for input(s): GLUCAP in the last 168 hours. Lipid Profile: No  results for input(s): CHOL, HDL, LDLCALC, TRIG, CHOLHDL, LDLDIRECT in the last 72 hours. Thyroid Function Tests: No results for input(s): TSH, T4TOTAL, FREET4, T3FREE, THYROIDAB in the last 72 hours. Anemia Panel: No results for input(s): VITAMINB12, FOLATE, FERRITIN, TIBC, IRON, RETICCTPCT in the last 72 hours. Sepsis Labs: Recent Labs  Lab 05/14/18 1109  LATICACIDVEN 1.27    Recent Results (from the past 240 hour(s))  Blood Culture (routine x 2)     Status: None (Preliminary result)   Collection Time: 05/14/18 11:07 AM  Result Value Ref Range Status   Specimen Description   Final    BLOOD LEFT ANTECUBITAL Performed at [facility], [address] [place], [place] [address]    Special Requests   Final    BOTTLES DRAWN AEROBIC AND ANAEROBIC Blood Culture adequate volume Performed at [facility], [address] [place], [place] [address]    Culture   Final    NO GROWTH 3 DAYS Performed at [facility], [address], [place], [place] [address]    Report Status  PENDING  Incomplete  Blood Culture (routine x 2)     Status: Abnormal   Collection Time: 05/14/18 11:07 AM  Result Value Ref Range Status   Specimen Description   Final    BLOOD RIGHT ANTECUBITAL Performed at [facility], [address], [place], [place] [address]    Special Requests   Final    BOTTLES DRAWN AEROBIC AND ANAEROBIC Blood Culture adequate volume Performed at [facility], [address], [place], [place] [address]    Culture  Setup Time   Final    GRAM POSITIVE COCCI IN CLUSTERS AEROBIC BOTTLE ONLY CRITICAL RESULT CALLED TO, READ BACK BY AND VERIFIED WITH: PHARMD C BARMAN [id number] AT 1208 BY CM    Culture (A)  Final    STAPHYLOCOCCUS SPECIES (COAGULASE NEGATIVE) THE SIGNIFICANCE OF ISOLATING THIS ORGANISM FROM A SINGLE SET OF BLOOD CULTURES WHEN MULTIPLE SETS ARE DRAWN IS UNCERTAIN. PLEASE NOTIFY THE MICROBIOLOGY DEPARTMENT WITHIN ONE WEEK IF SPECIATION AND SENSITIVITIES ARE REQUIRED. Performed at [facility], [address], [place], [place] [address]    Report Status 05/17/2018 FINAL  Final  Blood Culture ID Panel (Reflexed)     Status: Abnormal   Collection Time: 05/14/18 11:07 AM  Result Value Ref Range Status   Enterococcus species NOT DETECTED NOT DETECTED Final   Listeria monocytogenes NOT DETECTED NOT DETECTED Final   Staphylococcus species DETECTED (A) NOT DETECTED Final    Comment: Methicillin (oxacillin) resistant coagulase negative staphylococcus. Possible blood culture contaminant (unless isolated from more than one blood culture draw or clinical case suggests pathogenicity). No antibiotic treatment is indicated for blood  culture contaminants. CRITICAL RESULT CALLED TO, READ BACK BY AND VERIFIED WITH: PHARMD C BARMAN [id number] AT 1208 BY CM    Staphylococcus aureus NOT DETECTED NOT DETECTED Final   Methicillin resistance DETECTED (A) NOT DETECTED Final    Comment: CRITICAL RESULT CALLED TO, READ BACK BY AND  VERIFIED WITH: PHARMD C BARMAN 0822/19 AT 1208 BY CM    Streptococcus species NOT DETECTED NOT DETECTED Final   Streptococcus agalactiae NOT DETECTED NOT DETECTED Final   Streptococcus pneumoniae NOT DETECTED NOT DETECTED Final   Streptococcus pyogenes NOT DETECTED NOT DETECTED Final   Acinetobacter baumannii NOT DETECTED NOT DETECTED Final   Enterobacteriaceae species NOT DETECTED NOT DETECTED Final   Enterobacter cloacae complex NOT DETECTED NOT DETECTED Final  Escherichia coli NOT DETECTED NOT DETECTED Final   Klebsiella oxytoca NOT DETECTED NOT DETECTED Final   Klebsiella pneumoniae NOT DETECTED NOT DETECTED Final   Proteus species NOT DETECTED NOT DETECTED Final   Serratia marcescens NOT DETECTED NOT DETECTED Final   Haemophilus influenzae NOT DETECTED NOT DETECTED Final   Neisseria meningitidis NOT DETECTED NOT DETECTED Final   Pseudomonas aeruginosa NOT DETECTED NOT DETECTED Final   Candida albicans NOT DETECTED NOT DETECTED Final   Candida glabrata NOT DETECTED NOT DETECTED Final   Candida krusei NOT DETECTED NOT DETECTED Final   Candida parapsilosis NOT DETECTED NOT DETECTED Final   Candida tropicalis NOT DETECTED NOT DETECTED Final    Comment: Performed at [facility], [address] [place], [place] [address]  Respiratory Panel by PCR     Status: None   Collection Time: 05/15/18  2:56 AM  Result Value Ref Range Status   Adenovirus NOT DETECTED NOT DETECTED Final   Coronavirus 229E NOT DETECTED NOT DETECTED Final   Coronavirus HKU1 NOT DETECTED NOT DETECTED Final   Coronavirus NL63 NOT DETECTED NOT DETECTED Final   Coronavirus OC43 NOT DETECTED NOT DETECTED Final   Metapneumovirus NOT DETECTED NOT DETECTED Final   Rhinovirus / Enterovirus NOT DETECTED NOT DETECTED Final   Influenza A NOT DETECTED NOT DETECTED Final   Influenza B NOT DETECTED NOT DETECTED Final   Parainfluenza Virus 1 NOT DETECTED NOT DETECTED Final   Parainfluenza Virus 2 NOT DETECTED NOT  DETECTED Final   Parainfluenza Virus 3 NOT DETECTED NOT DETECTED Final   Parainfluenza Virus 4 NOT DETECTED NOT DETECTED Final   Respiratory Syncytial Virus NOT DETECTED NOT DETECTED Final   Bordetella pertussis NOT DETECTED NOT DETECTED Final   Chlamydophila pneumoniae NOT DETECTED NOT DETECTED Final   Mycoplasma pneumoniae NOT DETECTED NOT DETECTED Final    Comment: Performed at [facility], [address] [place], [place] [address]         Radiology Studies: No results found.      Scheduled Meds: . bictegravir-emtricitabine-tenofovir AF  1 tablet Oral Daily  . enoxaparin (LOVENOX) injection  40 mg Subcutaneous Q24H  . guaiFENesin  1,200 mg Oral BID  . ipratropium-albuterol  3 mL Nebulization BID  . [START ON 05/19/2018] predniSONE  40 mg Oral Q breakfast   Followed by  . [START ON 05/22/2018] predniSONE  20 mg Oral Q breakfast  . sodium chloride flush  3 mL Intravenous Q12H  . sulfamethoxazole-trimethoprim  2 tablet Oral Q8H   Continuous Infusions: . sodium chloride Stopped (05/17/18 1929)     LOS: 4 days    Time spent: 35 mins     Ramiro Harvestaniel Thompson, MD [facility] Pager [phone]  If 7PM-7AM, please contact night-coverage [email or web address] Password [email or web address] 05/18/2018, 11:29 AM

## 2018-05-19 LAB — CBC WITH DIFFERENTIAL/PLATELET
BASOS ABS: 0 10*3/uL (ref 0.0–0.1)
Basophils Relative: 0 %
EOS PCT: 0 %
Eosinophils Absolute: 0 10*3/uL (ref 0.0–0.7)
HEMATOCRIT: 27.3 % — AB (ref 39.0–52.0)
Hemoglobin: 9.2 g/dL — ABNORMAL LOW (ref 13.0–17.0)
LYMPHS PCT: 15 %
Lymphs Abs: 1 10*3/uL (ref 0.7–4.0)
MCH: 24.7 pg — ABNORMAL LOW (ref 26.0–34.0)
MCHC: 33.7 g/dL (ref 30.0–36.0)
MCV: 73.4 fL — ABNORMAL LOW (ref 78.0–100.0)
MONO ABS: 0.3 10*3/uL (ref 0.1–1.0)
Monocytes Relative: 4 %
Neutro Abs: 5.5 10*3/uL (ref 1.7–7.7)
Neutrophils Relative %: 81 %
Platelets: 558 10*3/uL — ABNORMAL HIGH (ref 150–400)
RBC: 3.72 MIL/uL — ABNORMAL LOW (ref 4.22–5.81)
RDW: 14.9 % (ref 11.5–15.5)
WBC: 6.8 10*3/uL (ref 4.0–10.5)

## 2018-05-19 LAB — CULTURE, BLOOD (ROUTINE X 2)
CULTURE: NO GROWTH
SPECIAL REQUESTS: ADEQUATE

## 2018-05-19 LAB — BASIC METABOLIC PANEL
Anion gap: 8 (ref 5–15)
BUN: 11 mg/dL (ref 6–20)
CALCIUM: 8.6 mg/dL — AB (ref 8.9–10.3)
CO2: 27 mmol/L (ref 22–32)
CREATININE: 0.72 mg/dL (ref 0.61–1.24)
Chloride: 103 mmol/L (ref 98–111)
GFR calc non Af Amer: >60 mL/min (ref >60)
Glucose, Bld: 105 mg/dL — ABNORMAL HIGH (ref 70–99)
Potassium: 3.3 mmol/L — ABNORMAL LOW (ref 3.5–5.1)
Sodium: 138 mmol/L (ref 135–145)

## 2018-05-19 MED ORDER — POTASSIUM CHLORIDE CRYS ER 20 MEQ PO TBCR
40.0000 meq | EXTENDED_RELEASE_TABLET | Freq: Once | ORAL | Status: AC
  Administered 2018-05-19: 40 meq via ORAL
  Filled 2018-05-19: qty 2

## 2018-05-19 NOTE — Progress Notes (Signed)
Patient ambulated on room air, O2 saturations 89%, patient reports feeling short of breath during ambulation.

## 2018-05-19 NOTE — Progress Notes (Signed)
RN provided pt with incentive spirometer and educated patient on use. Patient verbalized understanding.

## 2018-05-19 NOTE — Progress Notes (Signed)
PROGRESS NOTE    Charles Morrison  ZOX:[id number] DOB: 03-22-1981 DOA: 05/14/2018 PCP: System, Pcp Not In   Brief Narrative:  Patient is a 37 year old gentleman with history of HIV antibody positive noted per lab work from 12/13/2017 which was obtained during his prior hospitalization for reaction to amoxicillin after being started for treatment for strep throat, however patient unaware as results were finalized post discharge who presents to ED with a 3-day history of worsening shortness of breath, productive cough of clear sputum and noted on CT chest and chest x-ray to have bilateral interstitial and alveolar airspace opacities consistent with an acute pneumonic process versus interstitial edema.  Patient also noted to have a fever 100.7 on presentation to the ED as well as hypoxia.  Patient started empirically on IV Levaquin.  1 out of 2 blood cultures positive for gram-positive bacteria and patient also started on IV Levaquin.  ID consulted.   Assessment & Plan:   Principal Problem:   Acute hypoxemic respiratory failure (HCC) Active Problems:   Pneumocystis jiroveci pneumonia (HCC)   HIV antibody positive (HCC)   Hypokalemia   Sepsis (HCC)   Liver mass, left lobe   Anemia   Hypoxemia  #1 acute hypoxemic respiratory failure secondary to probable/presumed PJP.  Patient had presented with hypoxia, worsening shortness of breath, noted to have a fever with a temperature 100.7 with a tachycardia.  Patient still with fevers.  Patient noted on chart review to have a positive HIV antibody from 12/13/2017 which patient states had no idea her office patient was discharged prior to finalization of results.  Concern for possible opportunistic infection due to history of unknown HIV status.  Patient seen by ID, IV vancomycin and IV Levaquin have been discontinued per ID.  Patient was placed on IV Bactrim and has been subsequently transitioned to oral Bactrim per ID.  Prednisone started due to ongoing  hypoxia.  Continue neb treatments. ID following and appreciate input and recommendations.   #2 HIV antibody positive Noted during prior hospitalization of 12/13/2017.  Patient stated had no idea he was HIV antibody positive.  Patient was likely discharge prior to finalization of results.  It is noted that ID try to contact patient however that was unsuccessful and ID subsequently informed the health department per prior note.  Repeat HIV antibody positive and reactive.  RPR negative.  Hep B surface antigen negative.  Hepatitis C antibody 0.1.  CD4 count of 90.  Viral load of 154,000.  Patient has been started on biktarvy per ID.  ID following and patient to be started on therapy.  Per ID.   3.  1/4 bacteremia gram-positive/coagulase-negative staph likely contaminant Patient seen by ID 1/4 bottles with staph species coagulase-negative staph likely contaminant.  IV vancomycin has been discontinued per ID.  ID following.   4.  Hypokalemia Replete potassium.  Follow.    5.  Anemia of chronic disease Likely anemia of chronic disease as patient with iron level of 15, ferritin of 232 with a TIBC of 171.  No overt bleeding.  Hemoglobin is stable at 9.2.  Follow H&H.  6 liver mass/left lobe Area of decreased attenuation noted within the left lobe of the liver.  Abdominal ultrasound done with probable fatty infiltration of the liver.  May need nonemergent MR imaging for follow-up.   DVT prophylaxis: Lovenox Code Status: Full Family Communication: Updated patient at bedside.  Disposition Plan: Likely home once clinically improved and when okay with ID.     Consultants:  Infectious diseases: Dr. Drue Second 05/15/2018  Procedures:   CT angiogram chest 05/14/2018  Chest x-ray 05/14/2018  Abdominal ultrasound 05/14/2018  Antimicrobials:   IV Levaquin 05/14/2018>>>>>> 05/16/2018  IV vancomycin 05/14/2018>>>>>> 05/15/2018  IV Bactrim 05/15/2018>>>>>> 05/17/2018  Oral Bactrim  05/17/2018   Subjective: Patient sitting up in bed.  Still with some shortness of breath on minimal exertion however feels slowly improving.  No chest pain.  Cough improved.    Objective: Vitals:   05/19/18 0402 05/19/18 0404 05/19/18 0405 05/19/18 0745  BP:   111/71   Pulse:   87   Resp:   12   Temp:   97.9 F (36.6 C)   TempSrc:   Oral   SpO2: 92% 94% 94% 94%  Weight:      Height:        Intake/Output Summary (Last 24 hours) at 05/19/2018 1145 Last data filed at 05/19/2018 0405 Gross per 24 hour  Intake 840 ml  Output 1000 ml  Net -160 ml   Filed Weights   05/16/18 2100 05/17/18 0443 05/18/18 0617  Weight: 90.8 kg 91.4 kg 88.3 kg    Examination:  General exam: NAD. Respiratory system: Some scattered coarse breath sounds.  No wheezing.  Normal respiratory effort.  Cardiovascular system: Regular rate and rhythm no murmurs rubs or gallops.  No JVD.  No lower extremity edema. Gastrointestinal system: Abdomen is soft, nontender, nondistended, positive bowel sounds.  No rebound.  No guarding.  Central nervous system: Alert and oriented. No focal neurological deficits. Extremities: Symmetric 5 x 5 power. Skin: No rashes, lesions or ulcers Psychiatry: Judgement and insight appear normal. Mood & affect appropriate.     Data Reviewed: I have personally reviewed following labs and imaging studies  CBC: Recent Labs  Lab 05/14/18 1105 05/15/18 0835 05/16/18 0356 05/17/18 0957 05/18/18 0600 05/19/18 0500  WBC 7.9 7.7 11.8* 7.7 10.6* 6.8  NEUTROABS 6.0  --  10.3* 6.3 9.4* 5.5  HGB 10.6* 9.8* 10.2* 9.5* 10.3* 9.2*  HCT 32.5* 30.7* 31.9* 29.5* 30.6* 27.3*  MCV 75.8* 76.4* 75.1* 73.6* 72.3* 73.4*  PLT 546* 528* 500* 514* 615* 558*   Basic Metabolic Panel: Recent Labs  Lab 05/14/18 1105 05/15/18 0835 05/16/18 0356 05/17/18 0957 05/18/18 0600 05/19/18 0500  NA 141 143 139 136 140 138  K 2.7* 3.4* 3.0* 2.8* 3.7 3.3*  CL 101 107 103 100 102 103  CO2 26 28 27 26 28  27   GLUCOSE 101* 106* 104* 113* 113* 105*  BUN 9 6 6 6 8 11   CREATININE 0.82 0.80 0.75 0.87 0.81 0.72  CALCIUM 8.3* 8.3* 8.4* 8.3* 8.7* 8.6*  MG 2.2  --  1.9 1.8 2.2  --    GFR: Estimated Creatinine Clearance: 141.4 mL/min (by C-G formula based on SCr of 0.72 mg/dL). Liver Function Tests: Recent Labs  Lab 05/14/18 1105 05/16/18 0356  AST 40 33  ALT 20 16  ALKPHOS 49 45  BILITOT 0.8 0.6  PROT 7.9 7.2  ALBUMIN 2.5* 2.3*   Recent Labs  Lab 05/14/18 1105  LIPASE 22   No results for input(s): AMMONIA in the last 168 hours. Coagulation Profile: No results for input(s): INR, PROTIME in the last 168 hours. Cardiac Enzymes: No results for input(s): CKTOTAL, CKMB, CKMBINDEX, TROPONINI in the last 168 hours. BNP (last 3 results) No results for input(s): PROBNP in the last 8760 hours. HbA1C: No results for input(s): HGBA1C in the last 72 hours. CBG: No results for input(s): GLUCAP in the  last 168 hours. Lipid Profile: No results for input(s): CHOL, HDL, LDLCALC, TRIG, CHOLHDL, LDLDIRECT in the last 72 hours. Thyroid Function Tests: No results for input(s): TSH, T4TOTAL, FREET4, T3FREE, THYROIDAB in the last 72 hours. Anemia Panel: No results for input(s): VITAMINB12, FOLATE, FERRITIN, TIBC, IRON, RETICCTPCT in the last 72 hours. Sepsis Labs: Recent Labs  Lab 05/14/18 1109  LATICACIDVEN 1.27    Recent Results (from the past 240 hour(s))  Blood Culture (routine x 2)     Status: None   Collection Time: 05/14/18 11:07 AM  Result Value Ref Range Status   Specimen Description   Final    BLOOD LEFT ANTECUBITAL Performed at [facility], [address], [place], [place] [address]    Special Requests   Final    BOTTLES DRAWN AEROBIC AND ANAEROBIC Blood Culture adequate volume Performed at [facility], [address] [place], [place] [address]    Culture   Final    NO GROWTH 5 DAYS Performed at [facility], [address]  [place], [place] [address]    Report Status 05/19/2018 FINAL  Final  Blood Culture (routine x 2)     Status: Abnormal   Collection Time: 05/14/18 11:07 AM  Result Value Ref Range Status   Specimen Description   Final    BLOOD RIGHT ANTECUBITAL Performed at [facility], [address], [place], [place] [address]    Special Requests   Final    BOTTLES DRAWN AEROBIC AND ANAEROBIC Blood Culture adequate volume Performed at [facility], [address], [place], [place] [address]    Culture  Setup Time   Final    GRAM POSITIVE COCCI IN CLUSTERS AEROBIC BOTTLE ONLY CRITICAL RESULT CALLED TO, READ BACK BY AND VERIFIED WITH: PHARMD C BARMAN [id number] AT 1208 BY CM    Culture (A)  Final    STAPHYLOCOCCUS SPECIES (COAGULASE NEGATIVE) THE SIGNIFICANCE OF ISOLATING THIS ORGANISM FROM A SINGLE SET OF BLOOD CULTURES WHEN MULTIPLE SETS ARE DRAWN IS UNCERTAIN. PLEASE NOTIFY THE MICROBIOLOGY DEPARTMENT WITHIN ONE WEEK IF SPECIATION AND SENSITIVITIES ARE REQUIRED. Performed at [facility], [address] [place], [place] [address]    Report Status 05/17/2018 FINAL  Final  Blood Culture ID Panel (Reflexed)     Status: Abnormal   Collection Time: 05/14/18 11:07 AM  Result Value Ref Range Status   Enterococcus species NOT DETECTED NOT DETECTED Final   Listeria monocytogenes NOT DETECTED NOT DETECTED Final   Staphylococcus species DETECTED (A) NOT DETECTED Final    Comment: Methicillin (oxacillin) resistant coagulase negative staphylococcus. Possible blood culture contaminant (unless isolated from more than one blood culture draw or clinical case suggests pathogenicity). No antibiotic treatment is indicated for blood  culture contaminants. CRITICAL RESULT CALLED TO, READ BACK BY AND VERIFIED WITH: PHARMD C BARMAN [id number] AT 1208 BY CM    Staphylococcus aureus NOT DETECTED NOT DETECTED Final   Methicillin resistance DETECTED (A) NOT DETECTED Final    Comment:  CRITICAL RESULT CALLED TO, READ BACK BY AND VERIFIED WITH: PHARMD C BARMAN 0822/19 AT 1208 BY CM    Streptococcus species NOT DETECTED NOT DETECTED Final   Streptococcus agalactiae NOT DETECTED NOT DETECTED Final   Streptococcus pneumoniae NOT DETECTED NOT DETECTED Final   Streptococcus pyogenes NOT DETECTED NOT DETECTED Final   Acinetobacter baumannii NOT DETECTED NOT DETECTED Final   Enterobacteriaceae species NOT DETECTED NOT DETECTED Final   Enterobacter cloacae complex NOT  DETECTED NOT DETECTED Final   Escherichia coli NOT DETECTED NOT DETECTED Final   Klebsiella oxytoca NOT DETECTED NOT DETECTED Final   Klebsiella pneumoniae NOT DETECTED NOT DETECTED Final   Proteus species NOT DETECTED NOT DETECTED Final   Serratia marcescens NOT DETECTED NOT DETECTED Final   Haemophilus influenzae NOT DETECTED NOT DETECTED Final   Neisseria meningitidis NOT DETECTED NOT DETECTED Final   Pseudomonas aeruginosa NOT DETECTED NOT DETECTED Final   Candida albicans NOT DETECTED NOT DETECTED Final   Candida glabrata NOT DETECTED NOT DETECTED Final   Candida krusei NOT DETECTED NOT DETECTED Final   Candida parapsilosis NOT DETECTED NOT DETECTED Final   Candida tropicalis NOT DETECTED NOT DETECTED Final    Comment: Performed at [facility], [address] [place], [place] [address]  Respiratory Panel by PCR     Status: None   Collection Time: 05/15/18  2:56 AM  Result Value Ref Range Status   Adenovirus NOT DETECTED NOT DETECTED Final   Coronavirus 229E NOT DETECTED NOT DETECTED Final   Coronavirus HKU1 NOT DETECTED NOT DETECTED Final   Coronavirus NL63 NOT DETECTED NOT DETECTED Final   Coronavirus OC43 NOT DETECTED NOT DETECTED Final   Metapneumovirus NOT DETECTED NOT DETECTED Final   Rhinovirus / Enterovirus NOT DETECTED NOT DETECTED Final   Influenza A NOT DETECTED NOT DETECTED Final   Influenza B NOT DETECTED NOT DETECTED Final   Parainfluenza Virus 1 NOT DETECTED NOT DETECTED Final    Parainfluenza Virus 2 NOT DETECTED NOT DETECTED Final   Parainfluenza Virus 3 NOT DETECTED NOT DETECTED Final   Parainfluenza Virus 4 NOT DETECTED NOT DETECTED Final   Respiratory Syncytial Virus NOT DETECTED NOT DETECTED Final   Bordetella pertussis NOT DETECTED NOT DETECTED Final   Chlamydophila pneumoniae NOT DETECTED NOT DETECTED Final   Mycoplasma pneumoniae NOT DETECTED NOT DETECTED Final    Comment: Performed at [facility], [address] [place], [place] [address]         Radiology Studies: No results found.      Scheduled Meds: . bictegravir-emtricitabine-tenofovir AF  1 tablet Oral Daily  . enoxaparin (LOVENOX) injection  40 mg Subcutaneous Q24H  . guaiFENesin  1,200 mg Oral BID  . ipratropium-albuterol  3 mL Nebulization BID  . predniSONE  40 mg Oral Q breakfast   Followed by  . [START ON 05/22/2018] predniSONE  20 mg Oral Q breakfast  . sodium chloride flush  3 mL Intravenous Q12H  . sulfamethoxazole-trimethoprim  2 tablet Oral Q8H   Continuous Infusions: . sodium chloride Stopped (05/17/18 1929)     LOS: 5 days    Time spent: 35 mins     Ramiro Harvestaniel Thompson, MD [facility] Pager [phone]  If 7PM-7AM, please contact night-coverage [email or web address] Password [email or web address] 05/19/2018, 11:45 AM

## 2018-05-20 LAB — CBC
HCT: 28.3 % — ABNORMAL LOW (ref 39.0–52.0)
Hemoglobin: 9.6 g/dL — ABNORMAL LOW (ref 13.0–17.0)
MCH: 24.8 pg — AB (ref 26.0–34.0)
MCHC: 33.9 g/dL (ref 30.0–36.0)
MCV: 73.1 fL — AB (ref 78.0–100.0)
PLATELETS: 569 10*3/uL — AB (ref 150–400)
RBC: 3.87 MIL/uL — ABNORMAL LOW (ref 4.22–5.81)
RDW: 15.1 % (ref 11.5–15.5)
WBC: 6 10*3/uL (ref 4.0–10.5)

## 2018-05-20 LAB — BASIC METABOLIC PANEL
Anion gap: 10 (ref 5–15)
BUN: 13 mg/dL (ref 6–20)
CHLORIDE: 104 mmol/L (ref 98–111)
CO2: 25 mmol/L (ref 22–32)
Calcium: 8.8 mg/dL — ABNORMAL LOW (ref 8.9–10.3)
Creatinine, Ser: 0.77 mg/dL (ref 0.61–1.24)
GFR calc Af Amer: >60 mL/min (ref >60)
GFR calc non Af Amer: >60 mL/min (ref >60)
GLUCOSE: 106 mg/dL — AB (ref 70–99)
Potassium: 3.9 mmol/L (ref 3.5–5.1)
Sodium: 139 mmol/L (ref 135–145)

## 2018-05-20 MED ORDER — PREDNISONE 10 MG PO TABS: 10.0000 mg | ORAL_TABLET | Freq: Every day | ORAL | 0 refills | Status: DC

## 2018-05-20 MED ORDER — SULFAMETHOXAZOLE-TRIMETHOPRIM 800-160 MG PO TABS: 2.0000 | ORAL_TABLET | Freq: Three times a day (TID) | ORAL | 0 refills | Status: DC

## 2018-05-20 MED ORDER — ALBUTEROL SULFATE HFA 108 (90 BASE) MCG/ACT IN AERS: 2.0000 | INHALATION_SPRAY | Freq: Four times a day (QID) | RESPIRATORY_TRACT | 0 refills | Status: AC | PRN

## 2018-05-20 MED ORDER — GUAIFENESIN ER 600 MG PO TB12: 1200.0000 mg | ORAL_TABLET | Freq: Two times a day (BID) | ORAL | 0 refills | Status: AC

## 2018-05-20 NOTE — Progress Notes (Signed)
    [facility]    Date of Admission:  05/14/2018   Total days of antibiotics 7          ID: Charles Morrison is a 37 y.o. male with newly dx hiv disease with presumed pcp pneumonia Principal Problem:   Acute hypoxemic respiratory failure (HCC) Active Problems:   Hypokalemia   Sepsis (HCC)   Liver mass, left lobe   Anemia   HIV antibody positive (HCC)   Pneumocystis jiroveci pneumonia (HCC)   Hypoxemia    Subjective: Feeling better since starting steroids. No longer having dyspnea. Not needing supplemental o2  Medications:  . bictegravir-emtricitabine-tenofovir AF  1 tablet Oral Daily  . enoxaparin (LOVENOX) injection  40 mg Subcutaneous Q24H  . guaiFENesin  1,200 mg Oral BID  . ipratropium-albuterol  3 mL Nebulization BID  . predniSONE  40 mg Oral Q breakfast   Followed by  . [START ON 05/22/2018] predniSONE  20 mg Oral Q breakfast  . sodium chloride flush  3 mL Intravenous Q12H  . sulfamethoxazole-trimethoprim  2 tablet Oral Q8H    Objective: Vital signs in last 24 hours: Temp:  [97.7 F (36.5 C)-98.5 F (36.9 C)] 98.5 F (36.9 C) (08/27 1516) Pulse Rate:  [88-93] 89 (08/27 1516) Resp:  [16-19] 18 (08/27 1516) BP: (108-116)/(69-75) 116/75 (08/27 1516) SpO2:  [90 %-96 %] 94 % (08/27 1516) Physical Exam  Constitutional: He is oriented to person, place, and time. He appears well-developed and well-nourished. No distress.  HENT:  Mouth/Throat: Oropharynx is clear and moist. No oropharyngeal exudate.  Cardiovascular: Normal rate, regular rhythm and normal heart sounds. Exam reveals no gallop and no friction rub.  No murmur heard.  Pulmonary/Chest: Effort normal and breath sounds normal. No respiratory distress. He has no wheezes.  Abdominal: Soft. Bowel sounds are normal. He exhibits no distension. There is no tenderness.  Lymphadenopathy:  He has no cervical adenopathy.  Neurological: He is alert and oriented to person, place, and time.    Skin: Skin is warm and dry. No rash noted. No erythema.  Psychiatric: He has a normal mood and affect. His behavior is normal.     Lab Results Recent Labs    05/19/18 0500 05/20/18 0548  WBC 6.8 6.0  HGB 9.2* 9.6*  HCT 27.3* 28.3*  NA 138 139  K 3.3* 3.9  CL 103 104  CO2 27 25  BUN 11 13  CREATININE 0.72 0.77    Microbiology: reviewed Studies/Results: No results found.   Assessment/Plan: hiv disease = patient started on biktarvy to take daily. He has a bottle in hand that was [facility]. Continue to take daily. Genotype is pending. Initial VL is 154,000 with CD 4 count of 90   pcp pneumonia = plan to finish out the 21 day course of treatment with bactrim  DS 2 tab TID. Plus steroid taper.  We will see him back in the ID clinic in 7-10days to monitor kidney function and provide him for remaining bactrim for oi proph  Will have him contact clinic in the morning for work letter  [facility] Cell: [phone] Pager: [phone]  05/20/2018, 6:37 PM

## 2018-05-20 NOTE — Progress Notes (Signed)
Pt voiced understanding of D/C instructions and scrpts

## 2018-05-20 NOTE — Discharge Summary (Signed)
Physician Discharge Summary  Charles Morrison WUJ:[id number] DOB: [birth year] DOA: 05/14/2018  PCP: System, Pcp Not In  Admit date: 05/14/2018 Discharge date: 05/20/2018  Time spent: 60 minutes  Recommendations for Outpatient Follow-up:  1. Follow-up with Dr. Drue Second, ID in 2 weeks.   Discharge Diagnoses:  Principal Problem:   Acute hypoxemic respiratory failure (HCC) Active Problems:   Pneumocystis jiroveci pneumonia (HCC)   HIV antibody positive (HCC)   Hypokalemia   Sepsis (HCC)   Liver mass, left lobe   Anemia   Hypoxemia   Discharge Condition: Stable and improved  Diet recommendation: Regular  Filed Weights   05/17/18 0443 05/18/18 0617 05/19/18 0745  Weight: 91.4 kg 88.3 kg 88.2 kg    History of present illness:  Per Dr. Elana Alm is a 37 y.o. male with no significant past medical history who became short of breath about 3 days prior to admission and 1 day prior to admission began to have a cough which was productive of clear sputum. He stated he coughed so much that he ended up vomiting. He had otherwise not had any further vomiting. He was more short of breath the AM of admission, and decided to come to the ED. No complaint of sore throat, sinus issues or chest pain. He is noted to have a mild fever in the ED and was hypoxic.  He is noted to be anemic. No h/o of acute or chronic blood loss. He did not eat much red meat. He never had been anemic before.     ED Course:  Fever 100.7 Pulse ox 100 on 4 L K 2.7-   CXR > Bilateral interstitial and alveolar airspace opacities concerning for mild pulmonary edema versus multilobar pneumonia. CT chest with contrast > Patchy alveolar and interstitial infiltrates bilaterally consistent with acute pneumonic process and possible interstitial edema.   Hospital Course:  1 acute hypoxemic respiratory failure secondary to probable/presumed PJP.  Patient had presented with hypoxia, worsening shortness of breath, noted  to have a fever with a temperature 100.7 with a tachycardia.  Patient still with fevers.  Patient noted on chart review to have a positive HIV antibody from 12/13/2017 which patient stated had no idea of prior to discharged prior to finalization of results.  Concern for possible opportunistic infection due to history of unknown HIV status.  Patient seen by ID, IV vancomycin and IV Levaquin discontinued per ID.  Patient was placed on IV Bactrim and has been subsequently transitioned to oral Bactrim per ID.  Prednisone started due to ongoing hypoxia.    Patient placed on nebulizer treatments.  Patient hypoxia improved and was satting 94% on room air.  Patient will be discharged home in stable and improved condition on a prednisone taper as well as 13 more days of oral Bactrim.  Outpatient follow-up with ID.   #2 HIV antibody positive Noted during prior hospitalization of 12/13/2017.  Patient stated had no idea he was HIV antibody positive.  Patient was likely discharge prior to finalization of results.  It was noted that ID tried to contact patient however that was unsuccessful and ID subsequently informed the health department per prior note.  Repeat HIV antibody positive and reactive.  RPR negative.  Hep B surface antigen negative.  Hepatitis C antibody 0.1.  CD4 count of 90.  Viral load of 154,000.  Patient has been started on biktarvy per ID.  ID following and patient to be started on therapy.  Patient has medication of biktarvy and hand  which she will be discharged on.  Outpatient follow-up with ID.  3.  1/4 bacteremia gram-positive/coagulase-negative staph likely contaminant Patient seen by ID 1/4 bottles with staph species coagulase-negative staph likely contaminant.  IV vancomycin discontinued per ID.    4.  Hypokalemia Potassium repleted.   5.  Anemia of chronic disease Likely anemia of chronic disease as patient with iron level of 15, ferritin of 232 with a TIBC of 171.  No overt bleeding.   Hemoglobin remained stable at 9.6.  Outpatient follow-up.    6 liver mass/left lobe Area of decreased attenuation noted within the left lobe of the liver.  Abdominal ultrasound done with probable fatty infiltration of the liver.  May need nonemergent MR imaging for follow-up in the outpatient setting.   Procedures:  CT angiogram chest 05/14/2018  Chest x-ray 05/14/2018  Abdominal ultrasound 05/14/2018  Consultations:  Infectious diseases: Dr. Drue Second 05/15/2018  Discharge Exam: Vitals:   05/20/18 0728 05/20/18 1516  BP:  116/75  Pulse:  89  Resp:  18  Temp:  98.5 F (36.9 C)  SpO2: 96% 94%    General: NAD Cardiovascular: RRR Respiratory: Some coarse breath sounds.  No wheezing.  Discharge Instructions   Discharge Instructions    Diet general   Complete by:  As directed    Increase activity slowly   Complete by:  As directed      Allergies as of 05/20/2018      Reactions   Amoxicillin Rash   Also has mild lip and facial swelling   Shellfish Allergy Rash   Also had mild lip and facial swelling      Medication List    TAKE these medications   albuterol 108 (90 Base) MCG/ACT inhaler Commonly known as:  PROVENTIL HFA;VENTOLIN HFA Inhale 2 puffs into the lungs every 6 (six) hours as needed for wheezing or shortness of breath. 2 puffs 3 times daily x5 days then every 6 hours as needed.   bictegravir-emtricitabine-tenofovir AF 50-200-25 MG Tabs tablet Commonly known as:  BIKTARVY Take 1 tablet by mouth daily.   guaiFENesin 600 MG 12 hr tablet Commonly known as:  MUCINEX Take 2 tablets (1,200 mg total) by mouth 2 (two) times daily for 7 days.   MULTIVITAMIN ADULT Chew Chew 1 tablet by mouth 2 (two) times daily.   predniSONE 10 MG tablet Commonly known as:  DELTASONE Take 1-4 tablets (10-40 mg total) by mouth daily with breakfast. Take 4 tablets (40mg ) daily x 1 day, then 2 tablets (20mg ) daily x 5 days, then 1 tablet (10mg ) daily x 10 days then stop. Start  taking on:  05/21/2018   sulfamethoxazole-trimethoprim 800-160 MG tablet Commonly known as:  BACTRIM DS,SEPTRA DS Take 2 tablets by mouth every 8 (eight) hours for 13 days.      Allergies  Allergen Reactions  . Amoxicillin Rash    Also has mild lip and facial swelling  . Shellfish Allergy Rash    Also had mild lip and facial swelling   Follow-up Information    Judyann Munson, MD. Schedule an appointment as soon as possible for a visit in 2 week(s).   Specialty:  Infectious Diseases Contact information: [address] [place] [address] [phone]            The results of significant diagnostics from this hospitalization (including imaging, microbiology, ancillary and laboratory) are listed below for reference.    Significant Diagnostic Studies: Dg Chest 2 View  Result Date: 05/14/2018 CLINICAL  DATA:  Chest pain, shortness of breath for days EXAM: CHEST - 2 VIEW COMPARISON:  12/13/2017 FINDINGS: There are bilateral interstitial and alveolar airspace opacities. There is no pleural effusion or pneumothorax. The heart and mediastinal contours are unremarkable. The osseous structures are unremarkable. IMPRESSION: 1. Bilateral interstitial and alveolar airspace opacities concerning for mild pulmonary edema versus multilobar pneumonia. Electronically Signed   By: Elige Ko   On: 05/14/2018 11:07   Ct Angio Chest Pe W And/or Wo Contrast  Result Date: 05/14/2018 CLINICAL DATA:  Shortness of breath for several days EXAM: CT ANGIOGRAPHY CHEST WITH CONTRAST TECHNIQUE: Multidetector CT imaging of the chest was performed using the standard protocol during bolus administration of intravenous contrast. Multiplanar CT image reconstructions and MIPs were obtained to evaluate the vascular anatomy. CONTRAST:  ISOVUE-370 IOPAMIDOL (ISOVUE-370) INJECTION 76% COMPARISON:  Chest x-ray from earlier in the same day. FINDINGS: Cardiovascular: Thoracic aorta shows no  aneurysmal dilatation or dissection. No cardiac enlargement is noted. No significant coronary calcifications are noted. Pulmonary artery demonstrates a normal branching pattern. No filling defects to suggest pulmonary emboli are identified. Mediastinum/Nodes: Thoracic inlet is within normal limits. No significant hilar or mediastinal adenopathy is noted. A few small hilar lymph nodes are seen likely reactive in nature. The esophagus is within normal limits. Lungs/Pleura: Lungs are well aerated bilaterally. Patchy alveolar and interstitial changes are identified bilaterally consistent with acute infiltrate. Portion of this may be related to some interstitial edema. No sizable effusion is noted. No pneumothorax is seen. Upper Abdomen: Visualized upper abdomen demonstrates a geographic area of decreased attenuation within the left lobe of the liver. This measures negative Hounsfield units consistent with fluid although the overall appearance is not suggestive of a cyst. Musculoskeletal: No chest wall abnormality. No acute or significant osseous findings. Review of the MIP images confirms the above findings. IMPRESSION: Patchy alveolar and interstitial infiltrates bilaterally consistent with acute pneumonic process and possible interstitial edema. No evidence of pulmonary emboli. Area of decreased attenuation within the left lobe of the liver. This measures as cystic in appearance although the geographic nature is somewhat unusual. Ultrasound of the abdomen is recommended for further evaluation. Electronically Signed   By: Alcide Clever M.D.   On: 05/14/2018 13:54   US Abdomen Limited  Result Date: 05/14/2018 CLINICAL DATA:  LEFT lobe liver mass on CT EXAM: ULTRASOUND ABDOMEN LIMITED RIGHT UPPER QUADRANT COMPARISON:  CT chest 05/14/2018 FINDINGS: Gallbladder: Normally distended without stones or wall thickening. No pericholecystic fluid or sonographic Murphy sign. Common bile duct: Diameter: 5 mm diameter, normal  Liver: Echogenic parenchyma, likely fatty infiltration though this can be seen with cirrhosis and certain infiltrative disorders. Shadowing obscures portions of the LEFT lobe of the liver. The LEFT lobe abnormality identified on CT chest imaging is not sonographically delineated. Portal vein is patent on color Doppler imaging with normal direction of blood flow towards the liver. No RIGHT upper quadrant free fluid. IMPRESSION: Probable fatty infiltration of liver as above. Abnormality seen on CT in the LEFT lobe of the liver is not sonographically demonstrated, question obscured by bowel gas; recommend follow-up non emergent MR imaging to characterize. Electronically Signed   By: Ulyses Southward M.D.   On: 05/14/2018 17:16    Microbiology: Recent Results (from the past 240 hour(s))  Blood Culture (routine x 2)     Status: None   Collection Time: 05/14/18 11:07 AM  Result Value Ref Range Status   Specimen Description   Final  BLOOD LEFT ANTECUBITAL Performed at [facility], [address] [place], [place] [address]    Special Requests   Final    BOTTLES DRAWN AEROBIC AND ANAEROBIC Blood Culture adequate volume Performed at [facility], [address] [place], [place] [address]    Culture   Final    NO GROWTH 5 DAYS Performed at [facility], [address] [place], [place] [address]    Report Status 05/19/2018 FINAL  Final  Blood Culture (routine x 2)     Status: Abnormal   Collection Time: 05/14/18 11:07 AM  Result Value Ref Range Status   Specimen Description   Final    BLOOD RIGHT ANTECUBITAL Performed at [facility], [address] [place], [place] [address]    Special Requests   Final    BOTTLES DRAWN AEROBIC AND ANAEROBIC Blood Culture adequate volume Performed at [facility], [address] [place], [place] [address]    Culture  Setup Time   Final    GRAM POSITIVE COCCI IN CLUSTERS AEROBIC  BOTTLE ONLY CRITICAL RESULT CALLED TO, READ BACK BY AND VERIFIED WITH: PHARMD C BARMAN [id number] AT 1208 BY CM    Culture (A)  Final    STAPHYLOCOCCUS SPECIES (COAGULASE NEGATIVE) THE SIGNIFICANCE OF ISOLATING THIS ORGANISM FROM A SINGLE SET OF BLOOD CULTURES WHEN MULTIPLE SETS ARE DRAWN IS UNCERTAIN. PLEASE NOTIFY THE MICROBIOLOGY DEPARTMENT WITHIN ONE WEEK IF SPECIATION AND SENSITIVITIES ARE REQUIRED. Performed at [facility], [address] [place], [place] [address]    Report Status 05/17/2018 FINAL  Final  Blood Culture ID Panel (Reflexed)     Status: Abnormal   Collection Time: 05/14/18 11:07 AM  Result Value Ref Range Status   Enterococcus species NOT DETECTED NOT DETECTED Final   Listeria monocytogenes NOT DETECTED NOT DETECTED Final   Staphylococcus species DETECTED (A) NOT DETECTED Final    Comment: Methicillin (oxacillin) resistant coagulase negative staphylococcus. Possible blood culture contaminant (unless isolated from more than one blood culture draw or clinical case suggests pathogenicity). No antibiotic treatment is indicated for blood  culture contaminants. CRITICAL RESULT CALLED TO, READ BACK BY AND VERIFIED WITH: PHARMD C BARMAN [id number] AT 1208 BY CM    Staphylococcus aureus NOT DETECTED NOT DETECTED Final   Methicillin resistance DETECTED (A) NOT DETECTED Final    Comment: CRITICAL RESULT CALLED TO, READ BACK BY AND VERIFIED WITH: PHARMD C BARMAN 0822/19 AT 1208 BY CM    Streptococcus species NOT DETECTED NOT DETECTED Final   Streptococcus agalactiae NOT DETECTED NOT DETECTED Final   Streptococcus pneumoniae NOT DETECTED NOT DETECTED Final   Streptococcus pyogenes NOT DETECTED NOT DETECTED Final   Acinetobacter baumannii NOT DETECTED NOT DETECTED Final   Enterobacteriaceae species NOT DETECTED NOT DETECTED Final   Enterobacter cloacae complex NOT DETECTED NOT DETECTED Final   Escherichia coli NOT DETECTED NOT DETECTED Final   Klebsiella oxytoca NOT DETECTED  NOT DETECTED Final   Klebsiella pneumoniae NOT DETECTED NOT DETECTED Final   Proteus species NOT DETECTED NOT DETECTED Final   Serratia marcescens NOT DETECTED NOT DETECTED Final   Haemophilus influenzae NOT DETECTED NOT DETECTED Final   Neisseria meningitidis NOT DETECTED NOT DETECTED Final   Pseudomonas aeruginosa NOT DETECTED NOT DETECTED Final   Candida albicans NOT DETECTED NOT DETECTED Final   Candida glabrata NOT DETECTED NOT DETECTED Final   Candida krusei NOT DETECTED NOT DETECTED Final   Candida parapsilosis NOT DETECTED NOT  DETECTED Final   Candida tropicalis NOT DETECTED NOT DETECTED Final    Comment: Performed at [facility], [address] [place], [place] [address]  Respiratory Panel by PCR     Status: None   Collection Time: 05/15/18  2:56 AM  Result Value Ref Range Status   Adenovirus NOT DETECTED NOT DETECTED Final   Coronavirus 229E NOT DETECTED NOT DETECTED Final   Coronavirus HKU1 NOT DETECTED NOT DETECTED Final   Coronavirus NL63 NOT DETECTED NOT DETECTED Final   Coronavirus OC43 NOT DETECTED NOT DETECTED Final   Metapneumovirus NOT DETECTED NOT DETECTED Final   Rhinovirus / Enterovirus NOT DETECTED NOT DETECTED Final   Influenza A NOT DETECTED NOT DETECTED Final   Influenza B NOT DETECTED NOT DETECTED Final   Parainfluenza Virus 1 NOT DETECTED NOT DETECTED Final   Parainfluenza Virus 2 NOT DETECTED NOT DETECTED Final   Parainfluenza Virus 3 NOT DETECTED NOT DETECTED Final   Parainfluenza Virus 4 NOT DETECTED NOT DETECTED Final   Respiratory Syncytial Virus NOT DETECTED NOT DETECTED Final   Bordetella pertussis NOT DETECTED NOT DETECTED Final   Chlamydophila pneumoniae NOT DETECTED NOT DETECTED Final   Mycoplasma pneumoniae NOT DETECTED NOT DETECTED Final    Comment: Performed at [facility], [address] [place], [place] [address]     Labs: Basic Metabolic Panel: Recent Labs  Lab 05/14/18 1105  05/16/18 0356 05/17/18 0957  05/18/18 0600 05/19/18 0500 05/20/18 0548  NA 141   < > 139 136 140 138 139  K 2.7*   < > 3.0* 2.8* 3.7 3.3* 3.9  CL 101   < > 103 100 102 103 104  CO2 26   < > 27 26 28 27 25   GLUCOSE 101*   < > 104* 113* 113* 105* 106*  BUN 9   < > 6 6 8 11 13   CREATININE 0.82   < > 0.75 0.87 0.81 0.72 0.77  CALCIUM 8.3*   < > 8.4* 8.3* 8.7* 8.6* 8.8*  MG 2.2  --  1.9 1.8 2.2  --   --    < > = values in this interval not displayed.   Liver Function Tests: Recent Labs  Lab 05/14/18 1105 05/16/18 0356  AST 40 33  ALT 20 16  ALKPHOS 49 45  BILITOT 0.8 0.6  PROT 7.9 7.2  ALBUMIN 2.5* 2.3*   Recent Labs  Lab 05/14/18 1105  LIPASE 22   No results for input(s): AMMONIA in the last 168 hours. CBC: Recent Labs  Lab 05/14/18 1105  05/16/18 0356 05/17/18 0957 05/18/18 0600 05/19/18 0500 05/20/18 0548  WBC 7.9   < > 11.8* 7.7 10.6* 6.8 6.0  NEUTROABS 6.0  --  10.3* 6.3 9.4* 5.5  --   HGB 10.6*   < > 10.2* 9.5* 10.3* 9.2* 9.6*  HCT 32.5*   < > 31.9* 29.5* 30.6* 27.3* 28.3*  MCV 75.8*   < > 75.1* 73.6* 72.3* 73.4* 73.1*  PLT 546*   < > 500* 514* 615* 558* 569*   < > = values in this interval not displayed.   Cardiac Enzymes: No results for input(s): CKTOTAL, CKMB, CKMBINDEX, TROPONINI in the last 168 hours. BNP: BNP (last 3 results) Recent Labs    05/14/18 1105  BNP 14.5    ProBNP (last 3 results) No results for input(s): PROBNP in the last 8760 hours.  CBG: No results for input(s): GLUCAP in the last 168 hours.  Signed:  Ramiro Harvest MD.  [facility] 05/20/2018, 6:15 PM

## 2018-05-21 ENCOUNTER — Encounter: Payer: Self-pay | Admitting: Internal Medicine

## 2018-05-21 ENCOUNTER — Telehealth: Payer: Self-pay | Admitting: *Deleted

## 2018-05-21 NOTE — Telephone Encounter (Signed)
Per Dr Drue SecondSnider, RN called patient to schedule hospital/new B20 appointment with Tammy SoursGreg for 9/6 at 9:00. Patient accepted, asked if he could get the letter for work today that he and Dr Drue SecondSnider discussed in the hospital.  He will need to pick up forms from his job, will bring them here this afternoon. Andree CossHowell, Billal Rollo M, RN

## 2018-05-21 NOTE — Telephone Encounter (Signed)
Patient arrived for letter per Dr Drue SecondSnider.  He also brought FMLA paperwork to clinic. He would like to pick it up when it is complete so he can personally deliver it to his Benefits representative, Gwynn BurlyNancy Gillespie [phone]). He does not want his paperwork faxed to anyone else.  Copy placed in triage. Andree CossHowell, Michelle M, RN

## 2018-05-28 NOTE — Telephone Encounter (Signed)
Patient called today for an update on FMLA paperwork. Informed patient that we are still waiting for forms to be signed and that we would call once they have been completed. Lorenso Courier, [place]

## 2018-05-30 ENCOUNTER — Encounter: Payer: Self-pay | Admitting: Family

## 2018-05-30 ENCOUNTER — Ambulatory Visit (INDEPENDENT_AMBULATORY_CARE_PROVIDER_SITE_OTHER): Payer: 59 | Admitting: Family

## 2018-05-30 ENCOUNTER — Ambulatory Visit: Payer: 59

## 2018-05-30 VITALS — BP 108/76 | HR 109 | Temp 98.3°F | Ht 70.0 in | Wt 200.0 lb

## 2018-05-30 DIAGNOSIS — B20 Human immunodeficiency virus [HIV] disease: Secondary | ICD-10-CM

## 2018-05-30 DIAGNOSIS — B59 Pneumocystosis: Secondary | ICD-10-CM | POA: Diagnosis not present

## 2018-05-30 DIAGNOSIS — Z23 Encounter for immunization: Secondary | ICD-10-CM

## 2018-05-30 MED ORDER — SULFAMETHOXAZOLE-TRIMETHOPRIM 800-160 MG PO TABS: 1.0000 | ORAL_TABLET | Freq: Every day | ORAL | 2 refills | Status: DC

## 2018-05-30 NOTE — Assessment & Plan Note (Signed)
Charles Morrison appears to be doing well with his current regimen of Biktarvy with no adverse side effects and no missed doses.  Refills have been arranged with a co-pay card as needed.  Continues to have mild cough otherwise no other symptoms of opportunistic infection through history or physical exam.  Prevnar and influenza updated today.  Continue current dose of Biktarvy for ART and Bactrim for PCP treatment/prophylaxis.  Discussed in detail HIV disease, transmission, prevention, and treatment.  He declined meeting with our counseling services today.  He was oriented to the clinic and met with our financial assistance team.  Plan for follow-up in 1 month or sooner if needed.

## 2018-05-30 NOTE — Assessment & Plan Note (Signed)
Charles Morrison continues to take his Bactrim and prednisone as prescribed and continues to see improvement since leaving the hospital.  No additional fevers, chills, or sweats.  He does have a mild remaining cough which he notes is improving.  Continue current dosage of Bactrim and prednisone until completed and will transition to prophylactic dose of Bactrim.  Continue to monitor.

## 2018-05-30 NOTE — Progress Notes (Signed)
Subjective:    Patient ID: Khayden Herzberg, male    DOB: 1980/11/07, 37 y.o.   MRN: [id number]  Chief Complaint  Patient presents with  . Hospitalization Follow-up    PCP  . HIV Positive/AIDS    HPI:  Brogan Martis is a 37 y.o. male who presents today for initial office visit following hospitalization for PCP.   Mr. Quinley was initially hospitalized in March 2019 when he tested positive for HIV and unfortunately as lost to follow up. On 8/21 he was admitted to the hospital with shortness of breath and a productive cough with white sputum. In the ED he was febrile with a temperature of 101. Chest x-ray with bilateral interstitial infiltrates. A CT scan was negative for PE however was concerning for atypical infection. Initially started on levofloxacin. Found to have a viral load of 154,000 and CD4 count of 90. He was started on Bactrim and prednisone for 21 days. He was started on Biktarvy on 8/23 for ART therapy.   Since leaving the hospital he has been improving and continues to take the Bactrim and prednisone. He also continues to take his Biktarvy as prescribed with no adverse side effects or missed doses. Does have a mild cough that continues to improve with no fevers, chills, or shortness of breath. Cough remains productive with clear, white sputum. No other rashes, lesions, constipation, diarrhea, headaches, or changes in vision.    Allergies  Allergen Reactions  . Amoxicillin Rash    Also has mild lip and facial swelling  . Shellfish Allergy Rash    Also had mild lip and facial swelling      Outpatient Medications Prior to Visit  Medication Sig Dispense Refill  . albuterol (PROVENTIL HFA;VENTOLIN HFA) 108 (90 Base) MCG/ACT inhaler Inhale 2 puffs into the lungs every 6 (six) hours as needed for wheezing or shortness of breath. 2 puffs 3 times daily x5 days then every 6 hours as needed. 1 Inhaler 0  . bictegravir-emtricitabine-tenofovir AF (BIKTARVY) 50-200-25 MG TABS  tablet Take 1 tablet by mouth daily. 30 tablet 2  . Multiple Vitamins-Minerals (MULTIVITAMIN ADULT) CHEW Chew 1 tablet by mouth 2 (two) times daily.    . predniSONE (DELTASONE) 10 MG tablet Take 1-4 tablets (10-40 mg total) by mouth daily with breakfast. Take 4 tablets (80m) daily x 1 day, then 2 tablets (210m daily x 5 days, then 1 tablet (1071mdaily x 10 days then stop. 24 tablet 0  . sulfamethoxazole-trimethoprim (BACTRIM DS,SEPTRA DS) 800-160 MG tablet Take 2 tablets by mouth every 8 (eight) hours for 13 days. 78 tablet 0   No facility-administered medications prior to visit.      Past Medical History:  Diagnosis Date  . Medical history non-contributory       Past Surgical History:  Procedure Laterality Date  . NO PAST SURGERIES        Family History  Problem Relation Age of Onset  . Arthritis Mother        Knee      Social History   Socioeconomic History  . Marital status: Single    Spouse name: Not on file  . Number of children: 0  . Years of education: 16 19 Highest education level: Not on file  Occupational History  . Not on file  Social Needs  . Financial resource strain: Not hard at all  . Food insecurity:    Worry: Never true    Inability: Never true  . Transportation needs:  Medical: No    Non-medical: No  Tobacco Use  . Smoking status: Never Smoker  . Smokeless tobacco: Never Used  Substance and Sexual Activity  . Alcohol use: Yes    Comment: Occasion  . Drug use: No  . Sexual activity: Not on file    Comment: declined condoms  Lifestyle  . Physical activity:    Days per week: 3 days    Minutes per session: 60 min  . Stress: Only a little  Relationships  . Social connections:    Talks on phone: More than three times a week    Gets together: Three times a week    Attends religious service: 1 to 4 times per year    Active member of club or organization: No    Attends meetings of clubs or organizations: Never    Relationship status:  Never married  . Intimate partner violence:    Fear of current or ex partner: Not on file    Emotionally abused: Not on file    Physically abused: Not on file    Forced sexual activity: Not on file  Other Topics Concern  . Not on file  Social History Narrative  . Not on file      Review of Systems  Constitutional: Negative for appetite change, chills, fatigue, fever and unexpected weight change.  Eyes: Negative for visual disturbance.  Respiratory: Negative for cough, chest tightness, shortness of breath and wheezing.   Cardiovascular: Negative for chest pain and leg swelling.  Gastrointestinal: Negative for abdominal pain, constipation, diarrhea, nausea and vomiting.  Genitourinary: Negative for dysuria, flank pain, frequency, genital sores, hematuria and urgency.  Skin: Negative for rash.  Allergic/Immunologic: Negative for immunocompromised state.  Neurological: Negative for dizziness and headaches.       Objective:    BP 108/76   Pulse (!) 109   Temp 98.3 F (36.8 C)   Ht '5\' 10"'  (1.778 m)   Wt 200 lb (90.7 kg)   BMI 28.70 kg/m  Nursing note and vital signs reviewed.  Physical Exam  Constitutional: He is oriented to person, place, and time. He appears well-developed. No distress.  HENT:  Mouth/Throat: Oropharynx is clear and moist.  Eyes: Conjunctivae are normal.  Neck: Neck supple.  Cardiovascular: Normal rate, regular rhythm, normal heart sounds and intact distal pulses. Exam reveals no gallop and no friction rub.  No murmur heard. Pulmonary/Chest: Effort normal and breath sounds normal. No respiratory distress. He has no wheezes. He has no rales. He exhibits no tenderness.  Abdominal: Soft. Bowel sounds are normal. There is no tenderness.  Lymphadenopathy:    He has no cervical adenopathy.  Neurological: He is alert and oriented to person, place, and time.  Skin: Skin is warm and dry. No rash noted.  Psychiatric: He has a normal mood and affect. His behavior  is normal. Judgment and thought content normal.        Assessment & Plan:   Problem List Items Addressed This Visit      Respiratory   Pneumocystis jiroveci pneumonia [facility])    Mr. Kendall continues to take his Bactrim and prednisone as prescribed and continues to see improvement since leaving the hospital.  No additional fevers, chills, or sweats.  He does have a mild remaining cough which he notes is improving.  Continue current dosage of Bactrim and prednisone until completed and will transition to prophylactic dose of Bactrim.  Continue to monitor.      Relevant Medications  sulfamethoxazole-trimethoprim (BACTRIM DS,SEPTRA DS) 800-160 MG tablet     Other   AIDS (acquired immune deficiency syndrome) ([place]) - Primary    Mr. Pridgeon appears to be doing well with his current regimen of Biktarvy with no adverse side effects and no missed doses.  Refills have been arranged with a co-pay card as needed.  Continues to have mild cough otherwise no other symptoms of opportunistic infection through history or physical exam.  Prevnar and influenza updated today.  Continue current dose of Biktarvy for ART and Bactrim for PCP treatment/prophylaxis.  Discussed in detail HIV disease, transmission, prevention, and treatment.  He declined meeting with our counseling services today.  He was oriented to the clinic and met with our financial assistance team.  Plan for follow-up in 1 month or sooner if needed.      Relevant Medications   sulfamethoxazole-trimethoprim (BACTRIM DS,SEPTRA DS) 800-160 MG tablet   Other Relevant Orders   Pneumococcal conjugate vaccine 13-valent IM (Completed)    Other Visit Diagnoses    Need for immunization against influenza       Relevant Orders   Flu Vaccine QUAD 36+ mos IM (Completed)   Need for pneumococcal vaccine       Relevant Orders   Pneumococcal conjugate vaccine 13-valent IM (Completed)       I have changed Jacub Hukill's sulfamethoxazole-trimethoprim.  I am also having him maintain his MULTIVITAMIN ADULT, bictegravir-emtricitabine-tenofovir AF, predniSONE, and albuterol.   Meds ordered this encounter  Medications  . sulfamethoxazole-trimethoprim (BACTRIM DS,SEPTRA DS) 800-160 MG tablet    Sig: Take 1 tablet by mouth daily. Start after completing previous prescription.    Dispense:  30 tablet    Refill:  2    Order Specific Question:   Supervising Provider    Answer:   Carlyle Basques [[id number]]     Follow-up: Return in about 1 month (around 06/29/2018), or if symptoms worsen or fail to improve.    Terri Piedra, MSN, FNP-C Nurse Practitioner [facility] for Infectious Disease [facility] Office phone: [phone] Pager: [place] number: [phone]

## 2018-05-30 NOTE — Patient Instructions (Signed)
Nice to meet you!  Please continue to take your Bictarvy and Bactrim.   I will send in a new prescription for Bactrim to start at the completion of your current prescription.  Plan for office follow up in 1 month or sooner if needed to recheck your lab work.  We are here for any questions you may have!

## 2018-05-30 NOTE — Progress Notes (Signed)
Influenza and Prenar-13 administered this visit. Patient tolerated well.  Sharena Dibenedetto S. LPN

## 2018-06-06 LAB — HIV-1 RNA, PCR (GRAPH) RFX/GENO EDI
HIV-1 RNA BY PCR: 142000 copies/mL
HIV-1 RNA Quant, Log: 5.152 log10copy/mL

## 2018-06-06 LAB — REFLEX TO GENOSURE(R) MG EDI

## 2018-06-12 MED FILL — BIKTARVY 50-200-25 MG TABS: 50-200-25 | 30 days supply | Qty: 30 | Fill #1

## 2018-06-30 ENCOUNTER — Ambulatory Visit (INDEPENDENT_AMBULATORY_CARE_PROVIDER_SITE_OTHER): Payer: 59 | Admitting: Family

## 2018-06-30 ENCOUNTER — Encounter: Payer: Self-pay | Admitting: Family

## 2018-06-30 VITALS — BP 114/76 | HR 82 | Temp 97.9°F | Ht 70.0 in | Wt 203.0 lb

## 2018-06-30 DIAGNOSIS — B20 Human immunodeficiency virus [HIV] disease: Secondary | ICD-10-CM

## 2018-06-30 DIAGNOSIS — Z23 Encounter for immunization: Secondary | ICD-10-CM

## 2018-06-30 MED ORDER — BICTEGRAVIR-EMTRICITAB-TENOFOV 50-200-25 MG PO TABS: 1.0000 | ORAL_TABLET | Freq: Every day | ORAL | 2 refills | Status: DC

## 2018-06-30 MED ORDER — SULFAMETHOXAZOLE-TRIMETHOPRIM 800-160 MG PO TABS: 1.0000 | ORAL_TABLET | Freq: Every day | ORAL | 2 refills | Status: DC

## 2018-06-30 NOTE — Patient Instructions (Signed)
Nice to see you.  We will check your blood work today.  Continue to take your Biktarvy and Bactrim (once per day).  Plan for office follow up in 1 month or sooner if needed.

## 2018-06-30 NOTE — Progress Notes (Signed)
Subjective:    Patient ID: Charles Morrison, male    DOB: 02-27-1981, 36 y.o.   MRN: [id number]  Chief Complaint  Patient presents with  . Follow-up    AIDS    HPI:  Charles Morrison is a 37 y.o. male who presents today for routine follow-up of HIV/AIDS.  Charles Morrison was last seen in the office on 05/30/2018 for hospital follow-up and to initiate care for HIV disease following diagnosis with PCP pneumonia. He was continued on his Biktarvy and transitioned to prophylaxis dose of Bactrim. His viral load on 05/16/18 was 142,000 with a CD4 count of 90.  He has received his influenza and Prevnar vaccinations.  Due for Menveo.  Charles Morrison has been taking his Biktarvy and Bactrim as prescribed with no adverse side effects or missed doses.  He has no problems obtaining his medications from [facility] and [facility]. Feeling good with no further coughing. Denies fevers, chills, night sweats, headaches, changes in vision, neck pain/stiffness, nausea, diarrhea, vomiting, lesions or rashes.   Allergies  Allergen Reactions  . Amoxicillin Rash    Also has mild lip and facial swelling  . Shellfish Allergy Rash    Also had mild lip and facial swelling      Outpatient Medications Prior to Visit  Medication Sig Dispense Refill  . albuterol (PROVENTIL HFA;VENTOLIN HFA) 108 (90 Base) MCG/ACT inhaler Inhale 2 puffs into the lungs every 6 (six) hours as needed for wheezing or shortness of breath. 2 puffs 3 times daily x5 days then every 6 hours as needed. 1 Inhaler 0  . Multiple Vitamins-Minerals (MULTIVITAMIN ADULT) CHEW Chew 1 tablet by mouth 2 (two) times daily.    . bictegravir-emtricitabine-tenofovir AF (BIKTARVY) 50-200-25 MG TABS tablet Take 1 tablet by mouth daily. 30 tablet 2  . predniSONE (DELTASONE) 10 MG tablet Take 1-4 tablets (10-40 mg total) by mouth daily with breakfast. Take 4 tablets (40mg ) daily x 1 day, then 2 tablets (20mg ) daily x 5 days, then 1 tablet (10mg )  daily x 10 days then stop. 24 tablet 0  . sulfamethoxazole-trimethoprim (BACTRIM DS,SEPTRA DS) 800-160 MG tablet Take 1 tablet by mouth daily. Start after completing previous prescription. 30 tablet 2   No facility-administered medications prior to visit.      Past Medical History:  Diagnosis Date  . Medical history non-contributory      Past Surgical History:  Procedure Laterality Date  . NO PAST SURGERIES         Review of Systems  Constitutional: Negative for appetite change, chills, fatigue, fever and unexpected weight change.  Eyes: Negative for visual disturbance.  Respiratory: Negative for cough, chest tightness, shortness of breath and wheezing.   Cardiovascular: Negative for chest pain and leg swelling.  Gastrointestinal: Negative for abdominal pain, constipation, diarrhea, nausea and vomiting.  Genitourinary: Negative for dysuria, flank pain, frequency, genital sores, hematuria and urgency.  Skin: Negative for rash.  Allergic/Immunologic: Negative for immunocompromised state.  Neurological: Negative for dizziness and headaches.      Objective:    BP 114/76   Pulse 82   Temp 97.9 F (36.6 C)   Ht 5\' 10"  (1.778 m)   Wt 203 lb (92.1 kg)   BMI 29.13 kg/m  Nursing note and vital signs reviewed.  Physical Exam  Constitutional: He is oriented to person, place, and time. He appears well-developed. No distress.  HENT:  Mouth/Throat: Oropharynx is clear and moist.  Eyes: Conjunctivae are normal.  Neck: Neck supple.  Cardiovascular: Normal rate, regular rhythm, normal heart sounds and intact distal pulses. Exam reveals no gallop and no friction rub.  No murmur heard. Pulmonary/Chest: Effort normal and breath sounds normal. No respiratory distress. He has no wheezes. He has no rales. He exhibits no tenderness.  Abdominal: Soft. Bowel sounds are normal. There is no tenderness.  Lymphadenopathy:    He has no cervical adenopathy.  Neurological: He is alert and  oriented to person, place, and time.  Skin: Skin is warm and dry. No rash noted.  Psychiatric: He has a normal mood and affect. His behavior is normal. Judgment and thought content normal.       Assessment & Plan:   Problem List Items Addressed This Visit      Other   AIDS (acquired immune deficiency syndrome) ([facility]) - Primary    Charles Morrison appears to be continually improving with now resolution of his cough. He is doing well with his medication regimen of Biktarvy and has also been taking Bactrim for PCP prophylaxis with no adverse side effects. No signs/symptoms of opportunistic infection through history or physical exam. Menveo updated today. Check CD4 count and viral load today. Continue current dose of Biktarvy and Bactrim. Declines condoms. Plan for office follow up in 1 month or sooner if needed.       Relevant Medications   sulfamethoxazole-trimethoprim (BACTRIM DS,SEPTRA DS) 800-160 MG tablet   bictegravir-emtricitabine-tenofovir AF (BIKTARVY) 50-200-25 MG TABS tablet   Other Relevant Orders   T-helper cell (CD4)- ([facility] clinic only)   HIV 1 RNA quant-no reflex-bld       I have discontinued Yannis Dresch's predniSONE. I have also changed his sulfamethoxazole-trimethoprim. Additionally, I am having him maintain his MULTIVITAMIN ADULT, albuterol, and bictegravir-emtricitabine-tenofovir AF.   Meds ordered this encounter  Medications  . sulfamethoxazole-trimethoprim (BACTRIM DS,SEPTRA DS) 800-160 MG tablet    Sig: Take 1 tablet by mouth daily.    Dispense:  30 tablet    Refill:  2    Order Specific Question:   Supervising Provider    Answer:   Judyann Munson [[id number]]  . bictegravir-emtricitabine-tenofovir AF (BIKTARVY) 50-200-25 MG TABS tablet    Sig: Take 1 tablet by mouth daily.    Dispense:  30 tablet    Refill:  2    Order Specific Question:   Supervising Provider    Answer:   Judyann Munson [[id number]]     Follow-up: Return in about 1 month (around 07/31/2018), or  if symptoms worsen or fail to improve.   Marcos Eke, MSN, FNP-C Nurse Practitioner [facility] for Infectious Disease [facility] Office phone: [phone] Pager: [phone] [facility] Main number: [phone]

## 2018-06-30 NOTE — Assessment & Plan Note (Signed)
Charles Morrison appears to be continually improving with now resolution of his cough. He is doing well with his medication regimen of Biktarvy and has also been taking Bactrim for PCP prophylaxis with no adverse side effects. No signs/symptoms of opportunistic infection through history or physical exam. Menveo updated today. Check CD4 count and viral load today. Continue current dose of Biktarvy and Bactrim. Declines condoms. Plan for office follow up in 1 month or sooner if needed.

## 2018-07-01 LAB — T-HELPER CELL (CD4) - (RCID CLINIC ONLY)
CD4 % Helper T Cell: 13 % — ABNORMAL LOW (ref 33–55)
CD4 T Cell Abs: 230 /uL — ABNORMAL LOW (ref 400–2700)

## 2018-07-02 LAB — HIV-1 RNA QUANT-NO REFLEX-BLD
HIV 1 RNA Quant: <20 copies/mL — AB
HIV-1 RNA Quant, Log: <1.3 Log copies/mL — AB

## 2018-07-07 MED FILL — BIKTARVY 50-200-25 MG TABS: 50-200-25 | 30 days supply | Qty: 30 | Fill #0

## 2018-07-24 ENCOUNTER — Ambulatory Visit (HOSPITAL_COMMUNITY)
Admission: EM | Admit: 2018-07-24 | Discharge: 2018-07-24 | Disposition: A | Discharge status: Home / Self Care | Payer: 59 | Attending: Family Medicine | Admitting: Family Medicine

## 2018-07-24 ENCOUNTER — Other Ambulatory Visit: Payer: Self-pay

## 2018-07-24 ENCOUNTER — Encounter (HOSPITAL_COMMUNITY): Payer: Self-pay

## 2018-07-24 DIAGNOSIS — L03211 Cellulitis of face: Secondary | ICD-10-CM

## 2018-07-24 MED ORDER — CEPHALEXIN 500 MG PO CAPS: 500.0000 mg | ORAL_CAPSULE | Freq: Four times a day (QID) | ORAL | 0 refills | Status: AC

## 2018-07-24 NOTE — ED Triage Notes (Signed)
Pt states she has dry skin  And facial swelling on the right side that started yesterday. Dry skin x 2 weeks or more.

## 2018-07-24 NOTE — Discharge Instructions (Signed)
I am concerned about infection to the right side of your face.  I am starting an additional antibiotic for you to take in addition to your baseline medications.  Warm compresses may also be helpful.  Please follow up here or with your PCP/ID physician in the next few days for recheck if no improvement.  If worsening of swelling, pain, drainage, fevers, or otherwise worsening please go to the Er.

## 2018-07-24 NOTE — ED Provider Notes (Signed)
[facility]    CSN: [id number] Arrival date & time: 07/24/18  1416     History   Chief Complaint Chief Complaint  Patient presents with  . Facial Swelling    HPI Charles Morrison is a 37 y.o. male.   Charles Morrison presents with complaints of right sided facial swelling which started yesterday. States he has had facial dryness to his skin for weeks. But starting yesterday developed swelling to right cheek. Feels the swelling has decreased some, has tried hot and cold compresses which seemed like have helped. No fevers. No drainage. Minimal pain. Denies any previous similar. No dental pain. No other URI symptoms. He is on chronic bactrim for his HIV, as well as Biktarvy. Current levels undetectable. Hx of AIDS, AKI, sepsis.     ROS per HPI.      Past Medical History:  Diagnosis Date  . Medical history non-contributory     Patient Active Problem List   Diagnosis Date Noted  . AIDS (acquired immune deficiency syndrome) (HCC) 05/30/2018  . Pneumocystis jiroveci pneumonia (HCC) 05/17/2018  . Hypoxemia   . HIV antibody positive (HCC) 05/15/2018  . 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 Anemia 05/14/2018  . Acute hypoxemic respiratory failure (HCC) 05/14/2018  . Allergy to amoxicillin 12/13/2017  . Strep throat 12/13/2017  . Pre-syncope 12/13/2017  . Rash 12/13/2017  . AKI (acute kidney injury) (HCC) 12/13/2017  . Hypokalemia 12/13/2017  . Sepsis (HCC) 12/13/2017  . Allergic reaction 12/13/2017    Past Surgical History:  Procedure Laterality Date  . NO PAST SURGERIES         Home Medications    Prior to Admission medications   Medication Sig Start Date End Date Taking? Authorizing Provider  albuterol (PROVENTIL HFA;VENTOLIN HFA) 108 (90 Base) MCG/ACT inhaler Inhale 2 puffs into the lungs every 6 (six) hours as needed for wheezing or shortness of breath. 2 puffs 3 times daily x5 days then every 6 hours as needed. 05/20/18   Rodolph Bong, MD    bictegravir-emtricitabine-tenofovir AF (BIKTARVY) 50-200-25 MG TABS tablet Take 1 tablet by mouth daily. 06/30/18   Veryl Speak, FNP  cephALEXin (KEFLEX) 500 MG capsule Take 1 capsule (500 mg total) by mouth 4 (four) times daily for 7 days. 07/24/18 07/31/18  Georgetta Haber, NP  Multiple Vitamins-Minerals (MULTIVITAMIN ADULT) CHEW Chew 1 tablet by mouth 2 (two) times daily.    [provider]  sulfamethoxazole-trimethoprim (BACTRIM DS,SEPTRA DS) 800-160 MG tablet Take 1 tablet by mouth daily. 06/30/18 09/28/18  Veryl Speak, FNP    Family History Family History  Problem Relation Age of Onset  . Arthritis Mother        Knee    Social History Social History   Tobacco Use  . Smoking status: Never Smoker  . Smokeless tobacco: Never Used  Substance Use Topics  . Alcohol use: Yes    Comment: Occasion  . Drug use: No     Allergies   Amoxicillin and Shellfish allergy   Review of Systems Review of Systems   Physical Exam Triage Vital Signs ED Triage Vitals [07/24/18 1451]  Enc Vitals Group     BP 119/82     Pulse Rate 88     Resp 18     Temp 98.7 F (37.1 C)     Temp Source Oral     SpO2 100 %     Weight 201 lb (91.2 kg)     Height  Head Circumference      Peak Flow      Pain Score 0     Pain Loc      Pain Edu?      Excl. in GC?    No data found.  Updated Vital Signs BP 119/82 (BP Location: Left Arm)   Pulse 88   Temp 98.7 F (37.1 C) (Oral)   Resp 18   Wt 201 lb (91.2 kg)   SpO2 100%   BMI 28.84 kg/m    Physical Exam  Constitutional: He is oriented to person, place, and time. He appears well-developed and well-nourished.  Cardiovascular: Normal rate and regular rhythm.  Pulmonary/Chest: Effort normal and breath sounds normal.  Neurological: He is alert and oriented to person, place, and time.  Skin: Skin is warm and dry.  Flaky dry skin noted to face and neck; firmness to tissue to right cheek, minimal tenderness; no drainage;  no fluctuance; soft skin surrounding; mild swelling; see photos          UC Treatments / Results  Labs (all labs ordered are listed, but only abnormal results are displayed) Labs Reviewed - No data to display  EKG None  Radiology No results found.  Procedures Procedures (including critical care time)  Medications Ordered in UC Medications - No data to display  Initial Impression / Assessment and Plan / UC Course  I have reviewed the triage vital signs and the nursing notes.  Pertinent labs & imaging results that were available during my care of the patient were reviewed by me and considered in my medical decision making (see chart for details).     Concern for possible cellulitis/erysipilas to right face, likely related to skin breakdown from dry skin. Already on bactrim. Amoxicillin allergy. Will cover with keflex. Strict return precautions provided. Skin care for dry skin discussed. If symptoms worsen or do not improve in the next week to return to be seen or to follow up with PCP and/or ID. Patient verbalized understanding and agreeable to plan.    Final Clinical Impressions(s) / UC Diagnoses   Final diagnoses:  Facial cellulitis     Discharge Instructions     I am concerned about infection to the right side of your face.  I am starting an additional antibiotic for you to take in addition to your baseline medications.  Warm compresses may also be helpful.  Please follow up here or with your PCP/ID physician in the next few days for recheck if no improvement.  If worsening of swelling, pain, drainage, fevers, or otherwise worsening please go to the Er.     ED Prescriptions    Medication Sig Dispense Auth. Provider   cephALEXin (KEFLEX) 500 MG capsule Take 1 capsule (500 mg total) by mouth 4 (four) times daily for 7 days. 28 capsule Georgetta Haber, NP     Controlled Substance Prescriptions [place] Controlled Substance Registry consulted? Not Applicable    Georgetta Haber, NP 07/24/18 1544

## 2018-07-31 ENCOUNTER — Ambulatory Visit (INDEPENDENT_AMBULATORY_CARE_PROVIDER_SITE_OTHER): Payer: 59 | Admitting: Family

## 2018-07-31 ENCOUNTER — Encounter: Payer: Self-pay | Admitting: Family

## 2018-07-31 VITALS — BP 122/81 | HR 88 | Temp 98.0°F | Wt 210.0 lb

## 2018-07-31 DIAGNOSIS — B20 Human immunodeficiency virus [HIV] disease: Secondary | ICD-10-CM | POA: Diagnosis not present

## 2018-07-31 DIAGNOSIS — Z23 Encounter for immunization: Secondary | ICD-10-CM

## 2018-07-31 NOTE — Patient Instructions (Signed)
Nice to see you.  Continue to take your Biktarvy and Bactrim.   We will check your blood work today.   Plan for follow up office visit in 1 month or sooner if needed.

## 2018-07-31 NOTE — Progress Notes (Signed)
Subjective:    Patient ID: Charles Morrison, male    DOB: Dec 03, 1980, 37 y.o.   MRN: [id number]  Chief Complaint  Patient presents with  . HIV Positive/AIDS     HPI:  Charles Morrison is a 37 y.o. male who presents today for routine follow-up of HIV disease.  [name] was last seen in the office on 06/30/2018 with his antiretroviral regimen of Biktarvy and noted to be virally undetectable with a CD4 count of 230.  He was continued on his Biktarvy and Bactrim at the time.  Since he was last seen in the office he was seen in the emergency department on 07/24/2018 for facial cellulitis and was started on Keflex which he completed.  Due for Pneumovax vaccination today.   [name] continues to take his Biktarvy and Bactrim with no adverse side effects or missed doses.  He is taking the Bactrim once daily.  He has no problems obtaining his medication. Denies fevers, chills, night sweats, headaches, changes in vision, neck pain/stiffness, nausea, diarrhea, vomiting, lesions or rashes.   Allergies  Allergen Reactions  . Amoxicillin Rash    Also has mild lip and facial swelling  . Shellfish Allergy Rash    Also had mild lip and facial swelling      Outpatient Medications Prior to Visit  Medication Sig Dispense Refill  . albuterol (PROVENTIL HFA;VENTOLIN HFA) 108 (90 Base) MCG/ACT inhaler Inhale 2 puffs into the lungs every 6 (six) hours as needed for wheezing or shortness of breath. 2 puffs 3 times daily x5 days then every 6 hours as needed. 1 Inhaler 0  . bictegravir-emtricitabine-tenofovir AF (BIKTARVY) 50-200-25 MG TABS tablet Take 1 tablet by mouth daily. 30 tablet 2  . cephALEXin (KEFLEX) 500 MG capsule Take 1 capsule (500 mg total) by mouth 4 (four) times daily for 7 days. 28 capsule 0  . Multiple Vitamins-Minerals (MULTIVITAMIN ADULT) CHEW Chew 1 tablet by mouth 2 (two) times daily.    Marland Kitchen sulfamethoxazole-trimethoprim (BACTRIM DS,SEPTRA DS) 800-160 MG tablet Take 1 tablet by  mouth daily. 30 tablet 2   No facility-administered medications prior to visit.      Past Medical History:  Diagnosis Date  . Medical history non-contributory      Past Surgical History:  Procedure Laterality Date  . NO PAST SURGERIES       Review of Systems  Constitutional: Negative for appetite change, chills, fatigue, fever and unexpected weight change.  Eyes: Negative for visual disturbance.  Respiratory: Negative for cough, chest tightness, shortness of breath and wheezing.   Cardiovascular: Negative for chest pain and leg swelling.  Gastrointestinal: Negative for abdominal pain, constipation, diarrhea, nausea and vomiting.  Genitourinary: Negative for dysuria, flank pain, frequency, genital sores, hematuria and urgency.  Skin: Negative for rash.  Allergic/Immunologic: Negative for immunocompromised state.  Neurological: Negative for dizziness and headaches.      Objective:    BP 122/81   Pulse 88   Temp 98 F (36.7 C) (Oral)   Wt 210 lb (95.3 kg)   BMI 30.13 kg/m  Nursing note and vital signs reviewed.  Physical Exam  Constitutional: He is oriented to person, place, and time. He appears well-developed. No distress.  HENT:  Mouth/Throat: Oropharynx is clear and moist.  Eyes: Conjunctivae are normal.  Neck: Neck supple.  Cardiovascular: Normal rate, regular rhythm, normal heart sounds and intact distal pulses. Exam reveals no gallop and no friction rub.  No murmur heard. Pulmonary/Chest: Effort normal and breath sounds normal.  No respiratory distress. He has no wheezes. He has no rales. He exhibits no tenderness.  Abdominal: Soft. Bowel sounds are normal. There is no tenderness.  Lymphadenopathy:    He has no cervical adenopathy.  Neurological: He is alert and oriented to person, place, and time.  Skin: Skin is warm and dry. No rash noted.  Psychiatric: He has a normal mood and affect.       Assessment & Plan:   Problem List Items Addressed This Visit       Other   AIDS (acquired immune deficiency syndrome) (HCC) - Primary    Mr. Dettman has adequately controlled HIV/AIDS disease with his current medication regimen of Biktarvy supplemented for opportunistic infection with Bactrim.  He has noticed no adverse side effects or missed doses.  No signs/symptoms of opportunistic infection or progressive HIV disease.  Overall doing well.  Pneumovax updated today.  Check CD4 count and viral load today.  Continue current dose of Biktarvy and Bactrim.  If CD4 count remains elevated above 200 for 1 additional month can discontinue Bactrim.  Declines condoms and is not sexually active.  Follow-up office visit in 1 month or sooner if needed.      Relevant Orders   HIV 1 RNA quant-no reflex-bld   T-helper cell (CD4)- ([facility] clinic only)       I am having [name] maintain his MULTIVITAMIN ADULT, albuterol, sulfamethoxazole-trimethoprim, bictegravir-emtricitabine-tenofovir AF, and cephALEXin.    Follow-up: Return in about 1 month (around 08/30/2018), or if symptoms worsen or fail to improve.   Marcos Eke, MSN, FNP-C Nurse Practitioner [facility] Office phone: [phone] Pager: [phone] [facility] Main number: [phone]

## 2018-07-31 NOTE — Assessment & Plan Note (Signed)
Charles Morrison has adequately controlled HIV/AIDS disease with his current medication regimen of Biktarvy supplemented for opportunistic infection with Bactrim.  He has noticed no adverse side effects or missed doses.  No signs/symptoms of opportunistic infection or progressive HIV disease.  Overall doing well.  Pneumovax updated today.  Check CD4 count and viral load today.  Continue current dose of Biktarvy and Bactrim.  If CD4 count remains elevated above 200 for 1 additional month can discontinue Bactrim.  Declines condoms and is not sexually active.  Follow-up office visit in 1 month or sooner if needed.

## 2018-08-01 LAB — T-HELPER CELL (CD4) - (RCID CLINIC ONLY)
CD4 T CELL ABS: 310 /uL — AB (ref 400–2700)
CD4 T CELL HELPER: 12 % — AB (ref 33–55)

## 2018-08-04 LAB — HIV-1 RNA QUANT-NO REFLEX-BLD
HIV 1 RNA Quant: <20 copies/mL
HIV-1 RNA Quant, Log: <1.3 Log copies/mL

## 2018-08-18 MED FILL — BIKTARVY 50-200-25 MG TABS: 50-200-25 | 30 days supply | Qty: 30 | Fill #2

## 2018-08-20 DIAGNOSIS — L309 Dermatitis, unspecified: Secondary | ICD-10-CM | POA: Diagnosis not present

## 2018-08-20 DIAGNOSIS — B359 Dermatophytosis, unspecified: Secondary | ICD-10-CM | POA: Diagnosis not present

## 2018-09-15 ENCOUNTER — Ambulatory Visit: Payer: 59 | Admitting: Family

## 2018-09-15 NOTE — Progress Notes (Deleted)
Subjective:    Patient ID: Charles Morrison, male    DOB: 08/16/1981, 37 y.o.   MRN: [id number]  No chief complaint on file.    HPI:  Charles Morrison is a 37 y.o. male who presents today for routine follow-up of HIV/AIDS.  Charles Morrison was last seen in the office on 07/31/2018 with continued antiretroviral therapy with Biktarvy supplemented with Bactrim for OI prophylaxis with good adherence.  His viral load at the time was undetectable with a CD4 count of 310. Health maintenance due includes Menveo and dental screening.   Allergies  Allergen Reactions  . Amoxicillin Rash    Also has mild lip and facial swelling  . Shellfish Allergy Rash    Also had mild lip and facial swelling      Outpatient Medications Prior to Visit  Medication Sig Dispense Refill  . albuterol (PROVENTIL HFA;VENTOLIN HFA) 108 (90 Base) MCG/ACT inhaler Inhale 2 puffs into the lungs every 6 (six) hours as needed for wheezing or shortness of breath. 2 puffs 3 times daily x5 days then every 6 hours as needed. 1 Inhaler 0  . bictegravir-emtricitabine-tenofovir AF (BIKTARVY) 50-200-25 MG TABS tablet Take 1 tablet by mouth daily. 30 tablet 2  . Multiple Vitamins-Minerals (MULTIVITAMIN ADULT) CHEW Chew 1 tablet by mouth 2 (two) times daily.    Marland Kitchen. sulfamethoxazole-trimethoprim (BACTRIM DS,SEPTRA DS) 800-160 MG tablet Take 1 tablet by mouth daily. 30 tablet 2   No facility-administered medications prior to visit.      Past Medical History:  Diagnosis Date  . Medical history non-contributory      Past Surgical History:  Procedure Laterality Date  . NO PAST SURGERIES         Review of Systems  Constitutional: Negative for appetite change, chills, fatigue, fever and unexpected weight change.  Eyes: Negative for visual disturbance.  Respiratory: Negative for cough, chest tightness, shortness of breath and wheezing.   Cardiovascular: Negative for chest pain and leg swelling.  Gastrointestinal: Negative for  abdominal pain, constipation, diarrhea, nausea and vomiting.  Genitourinary: Negative for dysuria, flank pain, frequency, genital sores, hematuria and urgency.  Skin: Negative for rash.  Allergic/Immunologic: Negative for immunocompromised state.  Neurological: Negative for dizziness and headaches.      Objective:    There were no vitals taken for this visit. Nursing note and vital signs reviewed.  Physical Exam Constitutional:      General: He is not in acute distress.    Appearance: He is well-developed.  Eyes:     Conjunctiva/sclera: Conjunctivae normal.  Neck:     Musculoskeletal: Neck supple.  Cardiovascular:     Rate and Rhythm: Normal rate and regular rhythm.     Heart sounds: Normal heart sounds. No murmur. No friction rub. No gallop.   Pulmonary:     Effort: Pulmonary effort is normal. No respiratory distress.     Breath sounds: Normal breath sounds. No wheezing or rales.  Chest:     Chest wall: No tenderness.  Abdominal:     General: Bowel sounds are normal.     Palpations: Abdomen is soft.     Tenderness: There is no abdominal tenderness.  Lymphadenopathy:     Cervical: No cervical adenopathy.  Skin:    General: Skin is warm and dry.     Findings: No rash.  Neurological:     Mental Status: He is alert and oriented to person, place, and time.  Psychiatric:        Behavior: Behavior normal.  Thought Content: Thought content normal.        Judgment: Judgment normal.        Assessment & Plan:   Problem List Items Addressed This Visit    None       I am having Charles Morrison maintain his MULTIVITAMIN ADULT, albuterol, sulfamethoxazole-trimethoprim, and bictegravir-emtricitabine-tenofovir AF.   No orders of the defined types were placed in this encounter.    Follow-up: No follow-ups on file.   Marcos EkeGreg Darolyn Double, MSN, FNP-C Nurse Practitioner [facility] Office phone: [phone] Pager:  [phone] [facility] Main number: [phone]

## 2018-09-18 ENCOUNTER — Encounter: Payer: Self-pay | Admitting: Family

## 2018-09-18 ENCOUNTER — Ambulatory Visit (INDEPENDENT_AMBULATORY_CARE_PROVIDER_SITE_OTHER): Payer: 59 | Admitting: Family

## 2018-09-18 VITALS — BP 109/62 | HR 86 | Temp 98.0°F | Ht 70.0 in | Wt 220.0 lb

## 2018-09-18 DIAGNOSIS — B20 Human immunodeficiency virus [HIV] disease: Secondary | ICD-10-CM | POA: Diagnosis not present

## 2018-09-18 DIAGNOSIS — Z23 Encounter for immunization: Secondary | ICD-10-CM

## 2018-09-18 DIAGNOSIS — Z0000 Encounter for general adult medical examination without abnormal findings: Secondary | ICD-10-CM | POA: Diagnosis not present

## 2018-09-18 MED ORDER — BICTEGRAVIR-EMTRICITAB-TENOFOV 50-200-25 MG PO TABS: 1.0000 | ORAL_TABLET | Freq: Every day | ORAL | 3 refills | Status: DC

## 2018-09-18 MED FILL — BIKTARVY 50-200-25 MG TABS: 50-200-25 | 30 days supply | Qty: 30 | Fill #1

## 2018-09-18 NOTE — Assessment & Plan Note (Signed)
[name] has well controlled HIV disease with good adherence and tolerance to Biktarvy. He has no problems obtaining his medications. No current signs/symptoms of opportunistic infection or progressive HIV disease at present. CD4 count has remained above 300 and viral load suppressed so okay to stop Bactrim. Continue current dose of Biktarvy. Will check blood work today. Plan for follow up office visit in 3 months or sooner if needed with lab work 1-2 weeks prior to appointment.

## 2018-09-18 NOTE — Assessment & Plan Note (Signed)
   Menveo updated today.  Due for dental screen with [facility]. [name] may also seek his own dentist prior to appointment.  Declines condoms. Encouraged safe sexual practices to prevent STI infection.

## 2018-09-18 NOTE — Patient Instructions (Addendum)
Nice to see you.  STOP taking Bactrim.  CONTINUE taking Biktarvy as prescribed.  We will get you on the dental schedule for dental care.  Plan for follow up in 3 months or sooner if needed with lab work 1-2 weeks prior to appointment.   Have a HAPPY NEW YEAR!!!!!

## 2018-09-18 NOTE — Progress Notes (Signed)
Subjective:    Patient ID: Charles Morrison, male    DOB: 06/10/1981, 37 y.o.   MRN: [id number]  Chief Complaint  Patient presents with  . Follow-up    B20     HPI:  Charles Morrison is a 37 y.o. male who presents today for routine follow up of HIV disease.  [name] was last seen in the office on  07/31/18 for routine follow up with good adherence and tolerance of his antiretroviral regimen of Biktarvy supplemented with Bactrim for OI prophylaxis. He has had good viral suppression and has remained undetectable with last CD4 count over 300. Health maintenance due includes Menveo.   [name] continues to take his Biktarvy and Bactrim as prescribed with no missed doses and no adverse side effects. [facility]. Not currently sexually active. Continues to work for the [employer].   Denies fevers, chills, night sweats, headaches, changes in vision, neck pain/stiffness, nausea, diarrhea, vomiting, lesions or rashes.   Allergies  Allergen Reactions  . Amoxicillin Rash    Also has mild lip and facial swelling  . Shellfish Allergy Rash    Also had mild lip and facial swelling    Outpatient Medications Prior to Visit  Medication Sig Dispense Refill  . albuterol (PROVENTIL HFA;VENTOLIN HFA) 108 (90 Base) MCG/ACT inhaler Inhale 2 puffs into the lungs every 6 (six) hours as needed for wheezing or shortness of breath. 2 puffs 3 times daily x5 days then every 6 hours as needed. 1 Inhaler 0  . Multiple Vitamins-Minerals (MULTIVITAMIN ADULT) CHEW Chew 1 tablet by mouth 2 (two) times daily.    . bictegravir-emtricitabine-tenofovir AF (BIKTARVY) 50-200-25 MG TABS tablet Take 1 tablet by mouth daily. 30 tablet 2  . sulfamethoxazole-trimethoprim (BACTRIM DS,SEPTRA DS) 800-160 MG tablet Take 1 tablet by mouth daily. 30 tablet 2   No facility-administered medications prior to visit.      Past Medical History:  Diagnosis  Date  . Medical history non-contributory      Past Surgical History:  Procedure Laterality Date  . NO PAST SURGERIES         Review of Systems  Constitutional: Negative for appetite change, chills, fatigue, fever and unexpected weight change.  Eyes: Negative for visual disturbance.  Respiratory: Negative for cough, chest tightness, shortness of breath and wheezing.   Cardiovascular: Negative for chest pain and leg swelling.  Gastrointestinal: Negative for abdominal pain, constipation, diarrhea, nausea and vomiting.  Genitourinary: Negative for dysuria, flank pain, frequency, genital sores, hematuria and urgency.  Skin: Negative for rash.  Allergic/Immunologic: Negative for immunocompromised state.  Neurological: Negative for dizziness and headaches.      Objective:    BP 109/62   Pulse 86   Temp 98 F (36.7 C)   Ht 5\' 10"  (1.778 m)   Wt 220 lb (99.8 kg)   BMI 31.57 kg/m  Nursing note and vital signs reviewed.  Physical Exam Constitutional:      General: He is not in acute distress.    Appearance: He is well-developed.  Eyes:     Conjunctiva/sclera: Conjunctivae normal.  Neck:     Musculoskeletal: Neck supple.  Cardiovascular:     Rate and Rhythm: Normal rate and regular rhythm.     Heart sounds: Normal heart sounds. No murmur. No friction rub. No gallop.   Pulmonary:     Effort: Pulmonary effort is normal. No respiratory distress.     Breath sounds: Normal breath  sounds. No wheezing or rales.  Chest:     Chest wall: No tenderness.  Abdominal:     General: Bowel sounds are normal.     Palpations: Abdomen is soft.     Tenderness: There is no abdominal tenderness.  Lymphadenopathy:     Cervical: No cervical adenopathy.  Skin:    General: Skin is warm and dry.     Findings: No rash.  Neurological:     Mental Status: He is alert and oriented to person, place, and time.  Psychiatric:        Behavior: Behavior normal.        Thought Content: Thought content  normal.        Judgment: Judgment normal.        Assessment & Plan:   Problem List Items Addressed This Visit      Other   HIV disease (HCC) - Primary    [name] has well controlled HIV disease with good adherence and tolerance to Biktarvy. He has no problems obtaining his medications. No current signs/symptoms of opportunistic infection or progressive HIV disease at present. CD4 count has remained above 300 and viral load suppressed so okay to stop Bactrim. Continue current dose of Biktarvy. Will check blood work today. Plan for follow up office visit in 3 months or sooner if needed with lab work 1-2 weeks prior to appointment.       Relevant Medications   bictegravir-emtricitabine-tenofovir AF (BIKTARVY) 50-200-25 MG TABS tablet   Other Relevant Orders   T-helper cell (CD4)- ([facility] clinic only)   HIV-1 RNA quant-no reflex-bld   CBC   Comprehensive metabolic panel   Lipid panel   RPR   MENINGOCOCCAL MCV4O   T-helper cell (CD4)- ([facility] clinic only)   HIV 1 RNA quant-no reflex-bld   Healthcare maintenance     Menveo updated today.  Due for dental screen with [facility]. [name] may also seek his own dentist prior to appointment.  Declines condoms. Encouraged safe sexual practices to prevent STI infection.        Other Visit Diagnoses    Need for meningococcal vaccination       Relevant Orders   MENINGOCOCCAL MCV4O       I have discontinued Koleen Nimroddrian Nicolosi's sulfamethoxazole-trimethoprim. I am also having him maintain his MULTIVITAMIN ADULT, albuterol, and bictegravir-emtricitabine-tenofovir AF.   Meds ordered this encounter  Medications  . bictegravir-emtricitabine-tenofovir AF (BIKTARVY) 50-200-25 MG TABS tablet    Sig: Take 1 tablet by mouth daily.    Dispense:  30 tablet    Refill:  3    Order Specific Question:   Supervising Provider    Answer:   Judyann MunsonSNIDER, CYNTHIA [[id number]]     Follow-up: Return in about 3 months (around 12/18/2018),  or if symptoms worsen or fail to improve.   Marcos EkeGreg Calone, MSN, FNP-C Nurse Practitioner [facility] Office phone: [phone] Pager: [phone] [facility] Main number: [phone]

## 2018-09-19 LAB — T-HELPER CELL (CD4) - (RCID CLINIC ONLY)
CD4 T CELL HELPER: 10 % — AB (ref 33–55)
CD4 T Cell Abs: 220 /uL — ABNORMAL LOW (ref 400–2700)

## 2018-09-22 LAB — HIV-1 RNA QUANT-NO REFLEX-BLD
HIV 1 RNA Quant: <20 copies/mL — AB
HIV-1 RNA Quant, Log: <1.3 Log copies/mL — AB

## 2018-10-20 MED FILL — BIKTARVY 50-200-25 MG TABS: 50-200-25 | 30 days supply | Qty: 30 | Fill #2

## 2018-11-19 MED FILL — BIKTARVY 50-200-25 MG TABS: 50-200-25 | 30 days supply | Qty: 30 | Fill #0

## 2018-12-04 ENCOUNTER — Other Ambulatory Visit: Payer: 59

## 2018-12-09 ENCOUNTER — Other Ambulatory Visit: Payer: Self-pay

## 2018-12-09 ENCOUNTER — Other Ambulatory Visit: Payer: 59

## 2018-12-09 DIAGNOSIS — B20 Human immunodeficiency virus [HIV] disease: Secondary | ICD-10-CM

## 2018-12-11 LAB — CBC
HCT: 42.1 % (ref 38.5–50.0)
Hemoglobin: 13.7 g/dL (ref 13.2–17.1)
MCH: 25.1 pg — ABNORMAL LOW (ref 27.0–33.0)
MCHC: 32.5 g/dL (ref 32.0–36.0)
MCV: 77.1 fL — ABNORMAL LOW (ref 80.0–100.0)
MPV: 9.1 fL (ref 7.5–12.5)
PLATELETS: 324 10*3/uL (ref 140–400)
RBC: 5.46 10*6/uL (ref 4.20–5.80)
RDW: 17.8 % — ABNORMAL HIGH (ref 11.0–15.0)
WBC: 4.2 10*3/uL (ref 3.8–10.8)

## 2018-12-11 LAB — T-HELPER CELL (CD4) - (RCID CLINIC ONLY)
CD4 % Helper T Cell: 13 % — ABNORMAL LOW (ref 33–55)
CD4 T Cell Abs: 270 /uL — ABNORMAL LOW (ref 400–2700)

## 2018-12-11 LAB — LIPID PANEL
Cholesterol: 181 mg/dL (ref <200)
HDL: 38 mg/dL — ABNORMAL LOW (ref >=40)
LDL Cholesterol (Calc): 123 mg/dL (calc) — ABNORMAL HIGH
Non-HDL Cholesterol (Calc): 143 mg/dL (calc) — ABNORMAL HIGH (ref <130)
TRIGLYCERIDES: 102 mg/dL (ref <150)
Total CHOL/HDL Ratio: 4.8 (calc) (ref <5.0)

## 2018-12-11 LAB — COMPREHENSIVE METABOLIC PANEL
AG RATIO: 0.8 (calc) — AB (ref 1.0–2.5)
ALBUMIN MSPROF: 3.8 g/dL (ref 3.6–5.1)
ALT: 19 U/L (ref 9–46)
AST: 45 U/L — AB (ref 10–40)
Alkaline phosphatase (APISO): 67 U/L (ref 36–130)
BUN: 18 mg/dL (ref 7–25)
CHLORIDE: 99 mmol/L (ref 98–110)
CO2: 26 mmol/L (ref 20–32)
Calcium: 8.9 mg/dL (ref 8.6–10.3)
Creat: 1.35 mg/dL (ref 0.60–1.35)
GLOBULIN: 4.8 g/dL — AB (ref 1.9–3.7)
GLUCOSE: 92 mg/dL (ref 65–99)
POTASSIUM: 3.7 mmol/L (ref 3.5–5.3)
SODIUM: 136 mmol/L (ref 135–146)
Total Bilirubin: 0.4 mg/dL (ref 0.2–1.2)
Total Protein: 8.6 g/dL — ABNORMAL HIGH (ref 6.1–8.1)

## 2018-12-11 LAB — HIV-1 RNA QUANT-NO REFLEX-BLD
HIV 1 RNA Quant: <20 copies/mL
HIV-1 RNA Quant, Log: <1.3 Log copies/mL

## 2018-12-11 LAB — RPR: RPR: NONREACTIVE

## 2018-12-12 MED FILL — BIKTARVY 50-200-25 MG TABS: 50-200-25 | 30 days supply | Qty: 30 | Fill #1

## 2018-12-18 ENCOUNTER — Ambulatory Visit: Payer: 59 | Admitting: Family

## 2018-12-18 ENCOUNTER — Other Ambulatory Visit: Payer: Self-pay

## 2018-12-18 ENCOUNTER — Encounter: Payer: Self-pay | Admitting: Family

## 2018-12-18 VITALS — BP 120/74 | HR 71 | Temp 98.4°F | Wt 230.0 lb

## 2018-12-18 DIAGNOSIS — Z0000 Encounter for general adult medical examination without abnormal findings: Secondary | ICD-10-CM | POA: Diagnosis not present

## 2018-12-18 DIAGNOSIS — B20 Human immunodeficiency virus [HIV] disease: Secondary | ICD-10-CM | POA: Diagnosis not present

## 2018-12-18 MED ORDER — BICTEGRAVIR-EMTRICITAB-TENOFOV 50-200-25 MG PO TABS: 1.0000 | ORAL_TABLET | Freq: Every day | ORAL | 5 refills | Status: DC

## 2018-12-18 NOTE — Progress Notes (Signed)
Subjective:    Patient ID: Charles Morrison, male    DOB: 01/14/1981, 38 y.o.   MRN: [id number]  Chief Complaint  Patient presents with  . HIV Positive/AIDS     HPI:  Charles Morrison is a 38 y.o. male who presents today for routine follow up of HIV disease.   Mr. Venables was last seen in the office on 09/18/2018 with good adherence and tolerance to his ART regimen Biktarvy supplemented with Bactrim for OI prophylaxis.  CD4 count was 220 with a viral load that was undetectable. Bactrim was discontinued at that visit.  Most recent blood work completed on 12/09/2018 with a viral load that remains undetectable and CD4 count of 270.  RPR was nonreactive for syphilis.  Kidney function, and electrolytes within normal ranges.  AST slightly elevated at 45.  Lipid profile with HDL of 38, LDL of 123, and triglycerides of 102.  All immunizations are up-to-date per recommendations.  Mr. Ohler continues to take his Biktarvy as prescribed with no adverse side effects or missed doses.  Overall feeling very well.  Currently working from home due to the current situation with coronavirus. Denies fevers, chills, night sweats, headaches, changes in vision, neck pain/stiffness, nausea, diarrhea, vomiting, lesions or rashes.   Mr. Raybould has no problems obtaining his medications and remains covered through [employer] and receives his medication from [facility]. He is not currently sexually active. No recreational or illicit drug use. Denies feelings of being down, depressed or hopeless in the last 2 weeks. Found a dentist and awaiting appointment.     Allergies  Allergen Reactions  . Amoxicillin Rash    Also has mild lip and facial swelling  . Shellfish Allergy Rash    Also had mild lip and facial swelling      Outpatient Medications Prior to Visit  Medication Sig Dispense Refill  . albuterol (PROVENTIL HFA;VENTOLIN HFA) 108 (90 Base) MCG/ACT inhaler Inhale 2 puffs  into the lungs every 6 (six) hours as needed for wheezing or shortness of breath. 2 puffs 3 times daily x5 days then every 6 hours as needed. 1 Inhaler 0  . Multiple Vitamins-Minerals (MULTIVITAMIN ADULT) CHEW Chew 1 tablet by mouth 2 (two) times daily.    . bictegravir-emtricitabine-tenofovir AF (BIKTARVY) 50-200-25 MG TABS tablet Take 1 tablet by mouth daily. 30 tablet 3   No facility-administered medications prior to visit.      Past Medical History:  Diagnosis Date  . Medical history non-contributory      Past Surgical History:  Procedure Laterality Date  . NO PAST SURGERIES         Review of Systems  Constitutional: Negative for appetite change, chills, fatigue, fever and unexpected weight change.  Eyes: Negative for visual disturbance.  Respiratory: Negative for cough, chest tightness, shortness of breath and wheezing.   Cardiovascular: Negative for chest pain and leg swelling.  Gastrointestinal: Negative for abdominal pain, constipation, diarrhea, nausea and vomiting.  Genitourinary: Negative for dysuria, flank pain, frequency, genital sores, hematuria and urgency.  Skin: Negative for rash.  Allergic/Immunologic: Negative for immunocompromised state.  Neurological: Negative for dizziness and headaches.      Objective:    BP 120/74   Pulse 71   Temp 98.4 F (36.9 C) (Oral)   Wt 230 lb (104.3 kg)   BMI 33.00 kg/m  Nursing note and vital signs reviewed.  Physical Exam Constitutional:      General: He is not in acute distress.  Appearance: He is well-developed.  Eyes:     Conjunctiva/sclera: Conjunctivae normal.  Neck:     Musculoskeletal: Neck supple.  Cardiovascular:     Rate and Rhythm: Normal rate and regular rhythm.     Heart sounds: Normal heart sounds. No murmur. No friction rub. No gallop.   Pulmonary:     Effort: Pulmonary effort is normal. No respiratory distress.     Breath sounds: Normal breath sounds. No wheezing or rales.  Chest:      Chest wall: No tenderness.  Abdominal:     General: Bowel sounds are normal.     Palpations: Abdomen is soft.     Tenderness: There is no abdominal tenderness.  Lymphadenopathy:     Cervical: No cervical adenopathy.  Skin:    General: Skin is warm and dry.     Findings: No rash.  Neurological:     Mental Status: He is alert and oriented to person, place, and time.  Psychiatric:        Behavior: Behavior normal.        Thought Content: Thought content normal.        Judgment: Judgment normal.       Assessment & Plan:   Problem List Items Addressed This Visit      Other   HIV disease (HCC) - Primary    Mr. Sermersheim has well-controlled HIV disease with his current ART regimen of Biktarvy with good adherence and tolerance.  He has no problems obtaining his medications.  No signs/symptoms of opportunistic infection or progressive HIV disease at present.  Continue current dose of Biktarvy.  Follow-up office visit in 4 months or sooner if needed with lab work 1 to 2 weeks prior to appointment.      Relevant Medications   bictegravir-emtricitabine-tenofovir AF (BIKTARVY) 50-200-25 MG TABS tablet   Other Relevant Orders   HIV-1 RNA quant-no reflex-bld   T-helper cell (CD4)- ([facility] clinic only)   CBC   Comprehensive metabolic panel   Healthcare maintenance     All immunizations up-to-date per recommendations.  Awaiting dental visit for routine dental care. Discussed importance of safe sexual practice to reduce risk of acquisition and transmission of STI.           I am having Charles Morrison maintain his Multivitamin Adult, albuterol, and bictegravir-emtricitabine-tenofovir AF.   Meds ordered this encounter  Medications  . bictegravir-emtricitabine-tenofovir AF (BIKTARVY) 50-200-25 MG TABS tablet    Sig: Take 1 tablet by mouth daily.    Dispense:  30 tablet    Refill:  5    Order Specific Question:   Supervising Provider    Answer:   Judyann Munson [[id number]]      Follow-up: Return in about 4 months (around 04/19/2019).   Marcos Eke, MSN, FNP-C Nurse Practitioner [facility] Main number: [phone]

## 2018-12-18 NOTE — Patient Instructions (Signed)
Nice to see you.  Please continue to take your [place] as prescribed.  Plan for follow up office visit in 4 months or sooner if needed with lab work 1-2 weeks prior to appointment.

## 2018-12-18 NOTE — Assessment & Plan Note (Signed)
[name] has well-controlled HIV disease with his current ART regimen of Biktarvy with good adherence and tolerance.  He has no problems obtaining his medications.  No signs/symptoms of opportunistic infection or progressive HIV disease at present.  Continue current dose of Biktarvy.  Follow-up office visit in 4 months or sooner if needed with lab work 1 to 2 weeks prior to appointment.

## 2018-12-18 NOTE — Assessment & Plan Note (Signed)
   All immunizations up-to-date per recommendations.  Awaiting dental visit for routine dental care. Discussed importance of safe sexual practice to reduce risk of acquisition and transmission of STI.

## 2019-01-14 MED FILL — BIKTARVY 50-200-25 MG TABS: 50-200-25 | 30 days supply | Qty: 30 | Fill #2

## 2019-02-12 MED FILL — BIKTARVY 50-200-25 MG TABS: 50-200-25 | 30 days supply | Qty: 30 | Fill #3

## 2019-03-18 MED FILL — BIKTARVY 50-200-25 MG TABS: 50-200-25 | 30 days supply | Qty: 30 | Fill #0

## 2019-04-06 IMAGING — CT CT ANGIO CHEST
3 of 7 series · 18 of 36 positions shown · IV contrast (iopamidol)
Comparison: Chest x-ray from earlier in the same day.

CLINICAL DATA: Shortness of breath for several days

EXAM:
CT ANGIOGRAPHY CHEST WITH CONTRAST
TECHNIQUE: Multidetector CT imaging of the chest was performed using the
standard protocol during bolus administration of intravenous
contrast. Multiplanar CT image reconstructions and MIPs were
obtained to evaluate the vascular anatomy.
CONTRAST:  100mL MPALM7-DZO IOPAMIDOL (MPALM7-DZO) INJECTION 76%

[Series 5: thins · axial · 0.82mm/px · z∈[+1586,+1810]mm · 14 of 260 slices shown]
[im 18/260  lung]
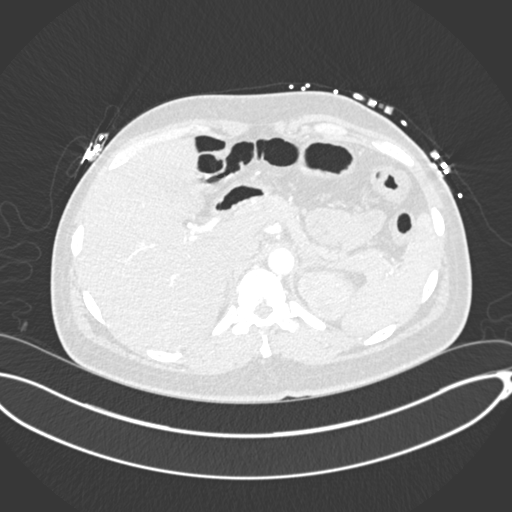
[im 35/260  mediastinal]
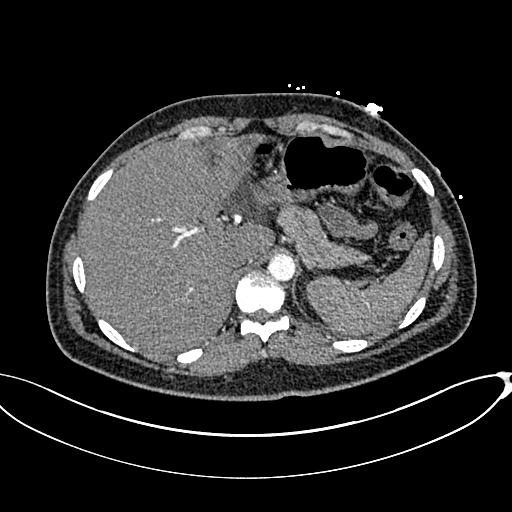
[im 52/260  lung]
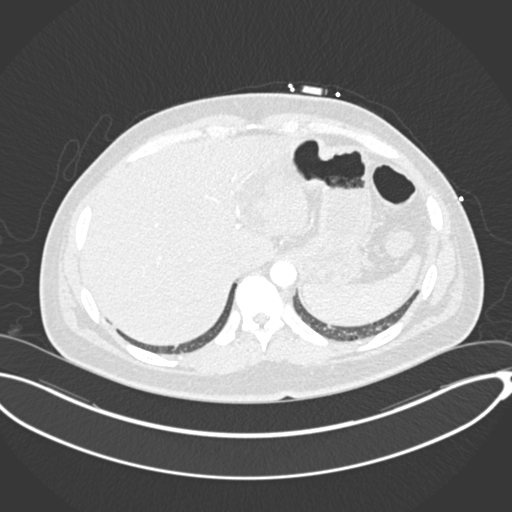
[im 70/260  mediastinal]
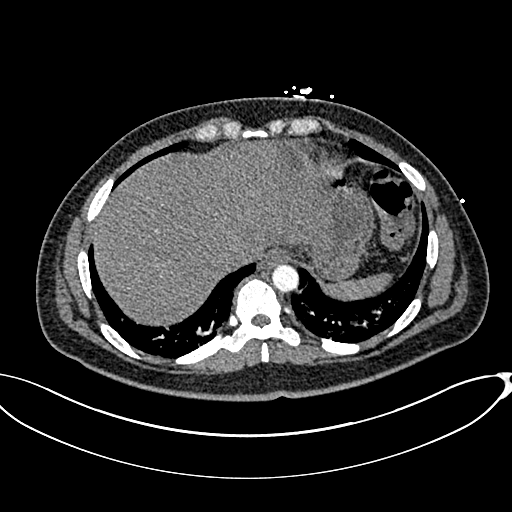
[im 87/260  lung]
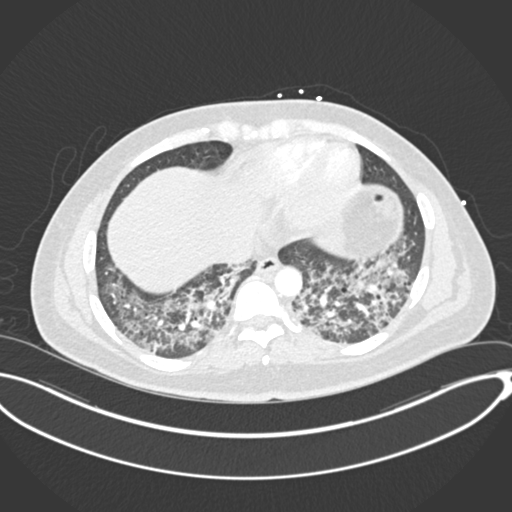
[im 104/260  mediastinal]
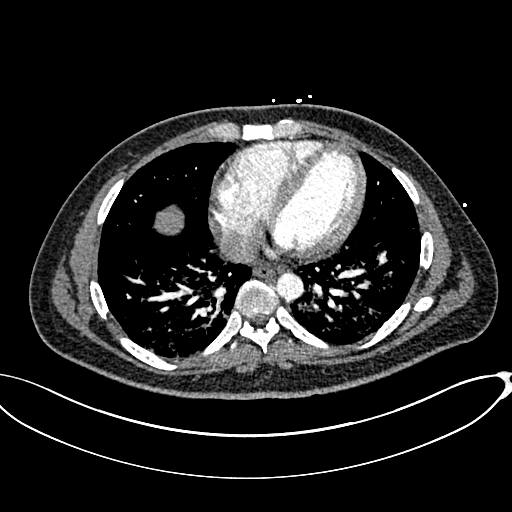
[im 121/260  lung]
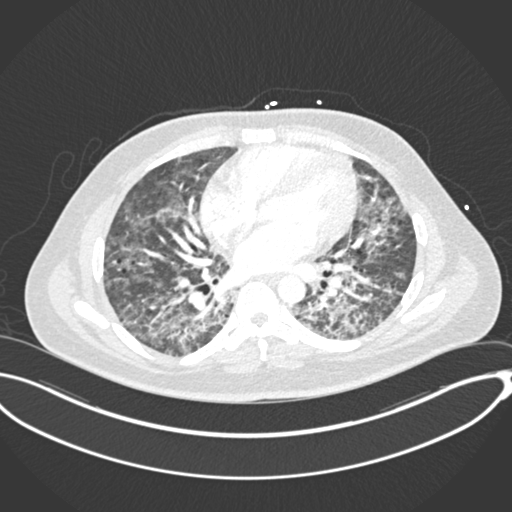
[im 139/260  mediastinal]
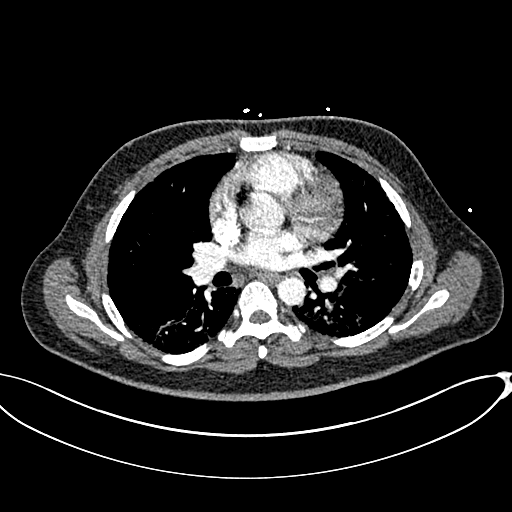
[im 156/260  lung]
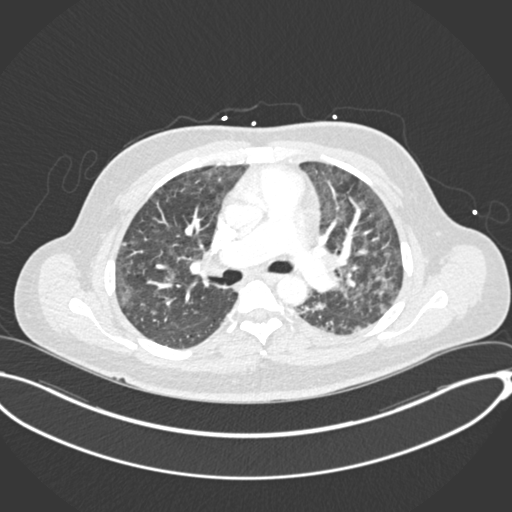
[im 173/260  mediastinal]
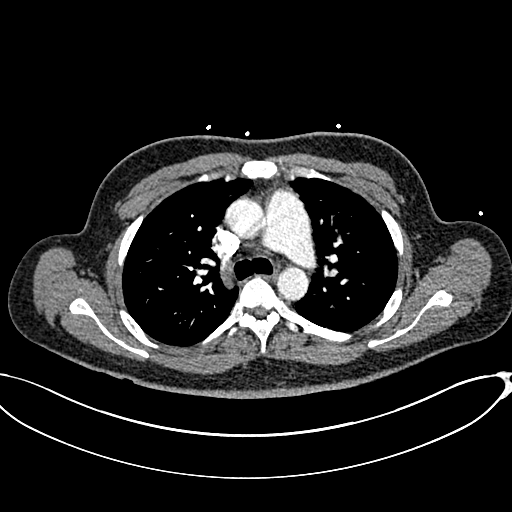
[im 190/260  lung]
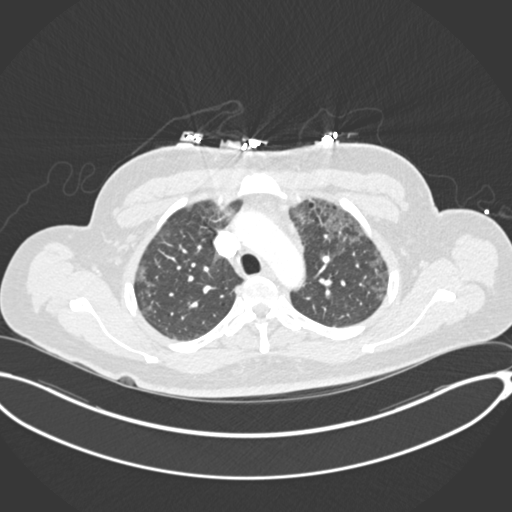
[im 208/260  mediastinal]
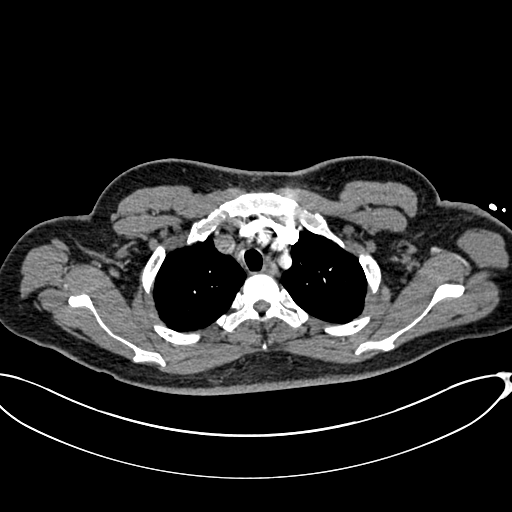
[im 225/260  lung]
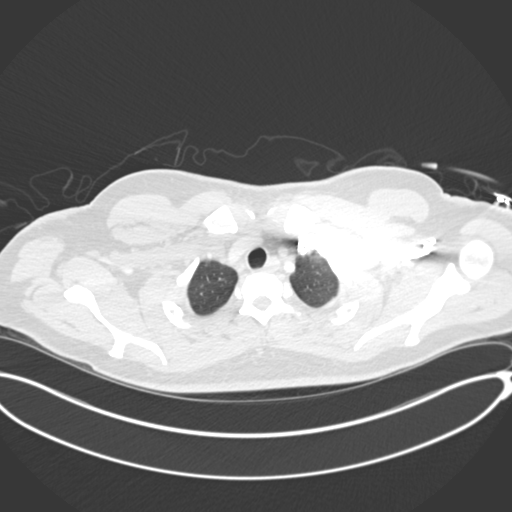
[im 242/260  mediastinal]
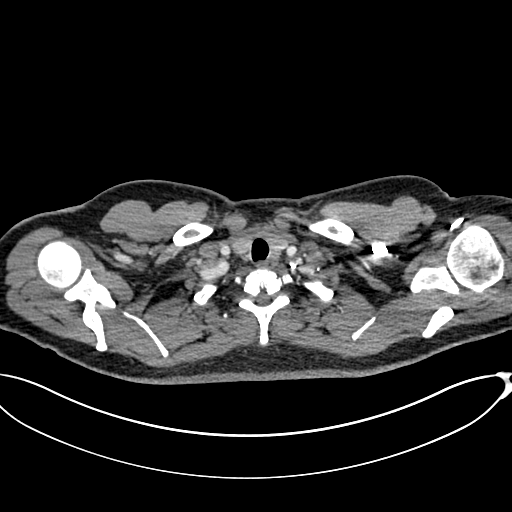

[Series 6: lung · axial · 0.82mm/px · z∈[+1638,+1714]mm · 3 of 115 slices shown]
[im 20/115  mediastinal]
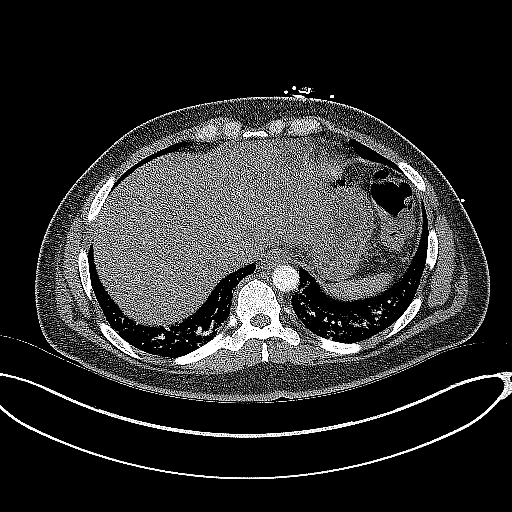
[im 39/115  mediastinal]
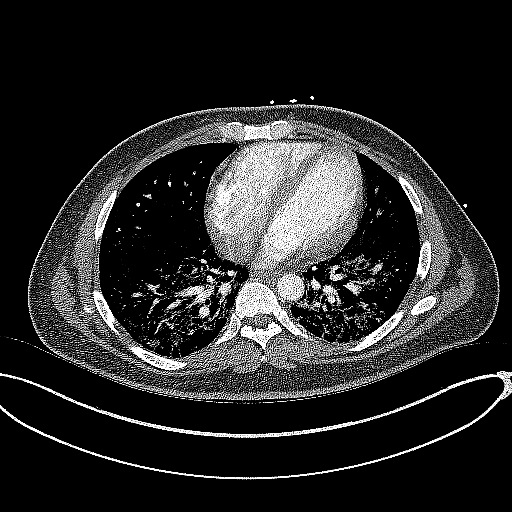
[im 58/115  mediastinal]
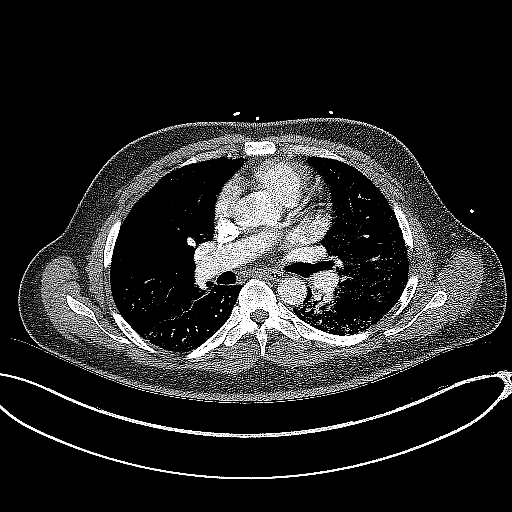

[Series 7: coronal mpr · coronal · 0.54mm/px · 1 of 148 slices shown]
[im 74/148  mediastinal]
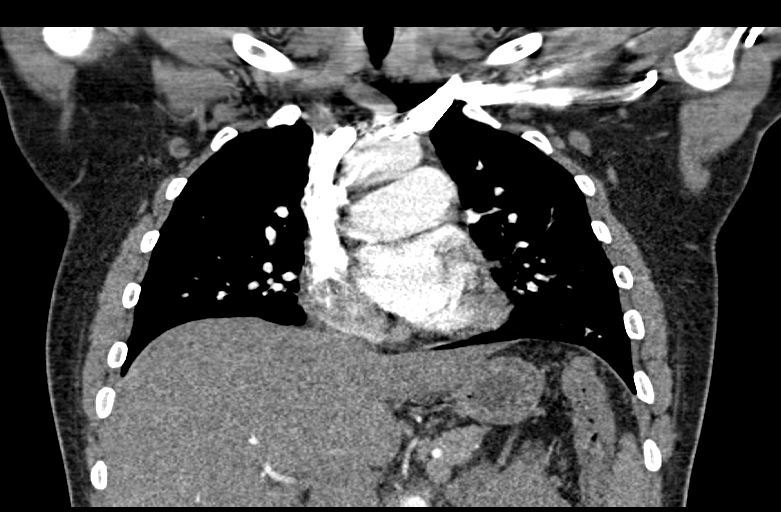

[18 of 36 positions shown; findings below may reference images not displayed]

FINDINGS: Cardiovascular: Thoracic aorta shows no aneurysmal dilatation or
dissection. No cardiac enlargement is noted. No significant coronary
calcifications are noted. Pulmonary artery demonstrates a normal
branching pattern. No filling defects to suggest pulmonary emboli
are identified.

Mediastinum/Nodes: Thoracic inlet is within normal limits. No
significant hilar or mediastinal adenopathy is noted. A few small
hilar lymph nodes are seen likely reactive in nature. The esophagus
is within normal limits.

Lungs/Pleura: Lungs are well aerated bilaterally. Patchy alveolar
and interstitial changes are identified bilaterally consistent with
acute infiltrate. Portion of this may be related to some
interstitial edema. No sizable effusion is noted. No pneumothorax is
seen.

Upper Abdomen: Visualized upper abdomen demonstrates a geographic
area of decreased attenuation within the left lobe of the liver.
This measures negative Hounsfield units consistent with fluid
although the overall appearance is not suggestive of a cyst.

Musculoskeletal: No chest wall abnormality. No acute or significant
osseous findings.

Review of the MIP images confirms the above findings.
IMPRESSION: Patchy alveolar and interstitial infiltrates bilaterally consistent
with acute pneumonic process and possible interstitial edema..

No evidence of pulmonary emboli.

Area of decreased attenuation within the left lobe of the liver.
This measures as cystic in appearance although the geographic nature
is somewhat unusual. Ultrasound of the abdomen is recommended for
further evaluation.

## 2019-04-20 ENCOUNTER — Other Ambulatory Visit: Payer: Self-pay

## 2019-04-20 ENCOUNTER — Other Ambulatory Visit: Payer: 59

## 2019-04-20 DIAGNOSIS — B20 Human immunodeficiency virus [HIV] disease: Secondary | ICD-10-CM

## 2019-04-20 MED FILL — BIKTARVY 50-200-25 MG TABS: 50-200-25 | 30 days supply | Qty: 30 | Fill #1

## 2019-04-21 LAB — T-HELPER CELL (CD4) - (RCID CLINIC ONLY)
CD4 % Helper T Cell: 13 % — ABNORMAL LOW (ref 33–65)
CD4 T Cell Abs: 262 /uL — ABNORMAL LOW (ref 400–1790)

## 2019-04-25 LAB — CBC
HCT: 40.2 % (ref 38.5–50.0)
Hemoglobin: 12.8 g/dL — ABNORMAL LOW (ref 13.2–17.1)
MCH: 25.4 pg — ABNORMAL LOW (ref 27.0–33.0)
MCHC: 31.8 g/dL — ABNORMAL LOW (ref 32.0–36.0)
MCV: 79.9 fL — ABNORMAL LOW (ref 80.0–100.0)
MPV: 9.2 fL (ref 7.5–12.5)
Platelets: 329 10*3/uL (ref 140–400)
RBC: 5.03 10*6/uL (ref 4.20–5.80)
RDW: 17 % — ABNORMAL HIGH (ref 11.0–15.0)
WBC: 5 10*3/uL (ref 3.8–10.8)

## 2019-04-25 LAB — COMPREHENSIVE METABOLIC PANEL
AG Ratio: 1 (calc) (ref 1.0–2.5)
ALT: 31 U/L (ref 9–46)
AST: 31 U/L (ref 10–40)
Albumin: 3.9 g/dL (ref 3.6–5.1)
Alkaline phosphatase (APISO): 67 U/L (ref 36–130)
BUN: 16 mg/dL (ref 7–25)
CO2: 25 mmol/L (ref 20–32)
Calcium: 9.2 mg/dL (ref 8.6–10.3)
Chloride: 103 mmol/L (ref 98–110)
Creat: 1.22 mg/dL (ref 0.60–1.35)
Globulin: 4 g/dL (calc) — ABNORMAL HIGH (ref 1.9–3.7)
Glucose, Bld: 78 mg/dL (ref 65–99)
Potassium: 4 mmol/L (ref 3.5–5.3)
Sodium: 138 mmol/L (ref 135–146)
Total Bilirubin: 0.6 mg/dL (ref 0.2–1.2)
Total Protein: 7.9 g/dL (ref 6.1–8.1)

## 2019-04-25 LAB — HIV-1 RNA QUANT-NO REFLEX-BLD
HIV 1 RNA Quant: <20 copies/mL
HIV-1 RNA Quant, Log: <1.3 Log copies/mL

## 2019-04-30 ENCOUNTER — Telehealth: Payer: Self-pay | Admitting: Family

## 2019-04-30 NOTE — Telephone Encounter (Signed)
COVID-19 Pre-Screening Questions: ° °Do you currently have a fever (>100 °F), chills or unexplained body aches? N ° °Are you currently experiencing new cough, shortness of breath, sore throat, runny nose? N °•  °Have you recently travelled outside the state of [place] in the last 14 days? N  °•  °Have you been in contact with someone that is currently pending confirmation of Covid19 testing or has been confirmed to have the Covid19 virus?  N ° °**If the patient answers NO to ALL questions -  advise the patient to please call the clinic before coming to the office should any symptoms develop.  ° ° ° °

## 2019-05-04 ENCOUNTER — Encounter: Payer: Self-pay | Admitting: Family

## 2019-05-04 ENCOUNTER — Other Ambulatory Visit: Payer: Self-pay

## 2019-05-04 ENCOUNTER — Ambulatory Visit: Payer: 59 | Admitting: Family

## 2019-05-04 VITALS — BP 116/83 | HR 59 | Temp 97.8°F | Wt 225.0 lb

## 2019-05-04 DIAGNOSIS — B20 Human immunodeficiency virus [HIV] disease: Secondary | ICD-10-CM | POA: Diagnosis not present

## 2019-05-04 DIAGNOSIS — Z0000 Encounter for general adult medical examination without abnormal findings: Secondary | ICD-10-CM | POA: Diagnosis not present

## 2019-05-04 MED ORDER — BIKTARVY 50-200-25 MG PO TABS: 1.0000 | ORAL_TABLET | Freq: Every day | ORAL | 5 refills | Status: DC

## 2019-05-04 NOTE — Progress Notes (Signed)
Subjective:    Patient ID: Charles Morrison, male    DOB: [birth year], 38 y.o.   MRN: [id number]  Chief Complaint  Patient presents with  . HIV Positive/AIDS     HPI:  Charles Morrison is a 38 y.o. male with HIV disease who was last seen in the office on 12/18/2018 with good adherence and tolerance to his ART regimen of Biktarvy.  Blood work at the time showed a viral load that was undetectable and CD4 count of 270.  Most recent blood work completed on 04/20/2019 shows a viral load that remains undetectable with CD4 count of 262.  Kidney function, liver function, electrolytes remain within normal ranges.  All immunizations are currently up-to-date per recommendations.  Charles Morrison continues to take his Biktarvy as prescribed no adverse side effects or missed doses.  Overall feeling very well today with no new concerns/complaints. Denies fevers, chills, night sweats, headaches, changes in vision, neck pain/stiffness, nausea, diarrhea, vomiting, lesions or rashes.  Charles Morrison remains covered through [place] healthcare and has no problems obtaining his medication from the pharmacy.  Denies any recent feelings of being down, depressed, or hopeless.  No recreational or illicit drug use or tobacco use.  Occasional alcohol.  Not currently sexually active with no new partner since most recent office visit.  He remains working full-time and hanging out with friends as able.  Dental exam remains pending.   Allergies  Allergen Reactions  . Amoxicillin Rash    Also has mild lip and facial swelling  . Shellfish Allergy Rash    Also had mild lip and facial swelling      Outpatient Medications Prior to Visit  Medication Sig Dispense Refill  . albuterol (PROVENTIL HFA;VENTOLIN HFA) 108 (90 Base) MCG/ACT inhaler Inhale 2 puffs into the lungs every 6 (six) hours as needed for wheezing or shortness of breath. 2 puffs 3 times daily x5 days then every 6 hours as needed. 1 Inhaler 0  . Multiple  Vitamins-Minerals (MULTIVITAMIN ADULT) CHEW Chew 1 tablet by mouth 2 (two) times daily.    . bictegravir-emtricitabine-tenofovir AF (BIKTARVY) 50-200-25 MG TABS tablet Take 1 tablet by mouth daily. 30 tablet 5   No facility-administered medications prior to visit.      Past Medical History:  Diagnosis Date  . Medical history non-contributory      Past Surgical History:  Procedure Laterality Date  . NO PAST SURGERIES         Review of Systems  Constitutional: Negative for appetite change, chills, fatigue, fever and unexpected weight change.  Eyes: Negative for visual disturbance.  Respiratory: Negative for cough, chest tightness, shortness of breath and wheezing.   Cardiovascular: Negative for chest pain and leg swelling.  Gastrointestinal: Negative for abdominal pain, constipation, diarrhea, nausea and vomiting.  Genitourinary: Negative for dysuria, flank pain, frequency, genital sores, hematuria and urgency.  Skin: Negative for rash.  Allergic/Immunologic: Negative for immunocompromised state.  Neurological: Negative for dizziness and headaches.      Objective:    BP 116/83   Pulse (!) 59   Temp 97.8 F (36.6 C) (Oral)   Wt 225 lb (102.1 kg)   BMI 32.28 kg/m  Nursing note and vital signs reviewed.  Physical Exam Constitutional:      General: He is not in acute distress.    Appearance: He is well-developed.  Eyes:     Conjunctiva/sclera: Conjunctivae normal.  Neck:     Musculoskeletal: Neck supple.  Cardiovascular:     Rate and  Rhythm: Normal rate and regular rhythm.     Heart sounds: Normal heart sounds. No murmur. No friction rub. No gallop.   Pulmonary:     Effort: Pulmonary effort is normal. No respiratory distress.     Breath sounds: Normal breath sounds. No wheezing or rales.  Chest:     Chest wall: No tenderness.  Abdominal:     General: Bowel sounds are normal.     Palpations: Abdomen is soft.     Tenderness: There is no abdominal tenderness.   Lymphadenopathy:     Cervical: No cervical adenopathy.  Skin:    General: Skin is warm and dry.     Findings: No rash.  Neurological:     Mental Status: He is alert and oriented to person, place, and time.  Psychiatric:        Behavior: Behavior normal.        Thought Content: Thought content normal.        Judgment: Judgment normal.      [facility] 05/04/2019 09/18/2018 07/31/2018 06/30/2018 05/30/2018  Decreased Interest 0 0 0 0 0  Down, Depressed, Hopeless 0 0 0 0 0  PHQ - 2 Score 0 0 0 0 0       Assessment & Plan:   Problem List Items Addressed This Visit      Other   HIV disease [facility](HCC)    Charles Morrison has well-controlled HIV disease with good adherence and tolerance to his ART regimen of Biktarvy.  No signs/symptoms of opportunistic infection or progressive HIV disease at present.  He has no problems obtaining his medication from the pharmacy.  Continue current dose of Biktarvy.  Plan for follow-up in 4 months or sooner if needed with lab work 1 to 2 weeks prior to appointment on same day.      Relevant Medications   bictegravir-emtricitabine-tenofovir AF (BIKTARVY) 50-200-25 MG TABS tablet   Other Relevant Orders   T-helper cell (CD4)- ([facility] clinic only)   HIV-1 RNA quant-no reflex-bld   Comprehensive metabolic panel   Healthcare maintenance - Primary     Awaiting dental referral which has been delayed due to coronavirus pandemic.  Immunizations reviewed and up-to-date per recommendations.  Discussed importance of safe sexual practice to reduce risk of acquisition/transmission of STI.  Declines condoms today.  Encouraged to receive influenza vaccination in September when available.          I am having Charles Morrison maintain his Multivitamin Adult, albuterol, and Biktarvy.   Meds ordered this encounter  Medications  . bictegravir-emtricitabine-tenofovir AF (BIKTARVY) 50-200-25 MG TABS tablet    Sig: Take 1 tablet by mouth daily.    Dispense:   30 tablet    Refill:  5    Order Specific Question:   Supervising Provider    Answer:   Judyann MunsonSNIDER, CYNTHIA [[id number]]     Follow-up: Return in 4 months (on 09/03/2019), or if symptoms worsen or fail to improve.   Charles EkeGreg Janai Brannigan, MSN, FNP-C Nurse Practitioner [facility] Main number: [phone]

## 2019-05-04 NOTE — Assessment & Plan Note (Signed)
   Awaiting dental referral which has been delayed due to coronavirus pandemic.  Immunizations reviewed and up-to-date per recommendations.  Discussed importance of safe sexual practice to reduce risk of acquisition/transmission of STI.  Declines condoms today.  Encouraged to receive influenza vaccination in September when available.

## 2019-05-04 NOTE — Assessment & Plan Note (Signed)
Mr. Codner has well-controlled HIV disease with good adherence and tolerance to his ART regimen of Biktarvy.  No signs/symptoms of opportunistic infection or progressive HIV disease at present.  He has no problems obtaining his medication from the pharmacy.  Continue current dose of Biktarvy.  Plan for follow-up in 4 months or sooner if needed with lab work 1 to 2 weeks prior to appointment on same day.

## 2019-05-04 NOTE — Patient Instructions (Signed)
Nice to see you!  Please continue take your Biktarvy as prescribed.  Refills of medication have been sent to the pharmacy.  Recommend a flu shot when available through work or our office.  Continue to work on obtaining a dental screening.  Plan for follow-up in 4 months or sooner if needed with lab work 1 to 2 weeks prior to appointment.  Have a great day and stay safe!

## 2019-05-08 ENCOUNTER — Ambulatory Visit (HOSPITAL_COMMUNITY)
Admission: EM | Admit: 2019-05-08 | Discharge: 2019-05-08 | Disposition: A | Discharge status: Home / Self Care | Payer: 59 | Attending: Internal Medicine | Admitting: Internal Medicine

## 2019-05-08 ENCOUNTER — Other Ambulatory Visit: Payer: Self-pay

## 2019-05-08 ENCOUNTER — Encounter (HOSPITAL_COMMUNITY): Payer: Self-pay | Admitting: Emergency Medicine

## 2019-05-08 ENCOUNTER — Telehealth: Payer: 59

## 2019-05-08 DIAGNOSIS — Z20828 Contact with and (suspected) exposure to other viral communicable diseases: Secondary | ICD-10-CM

## 2019-05-08 DIAGNOSIS — Z1159 Encounter for screening for other viral diseases: Secondary | ICD-10-CM | POA: Diagnosis present

## 2019-05-08 NOTE — ED Provider Notes (Signed)
[facility]    CSN: [id number] Arrival date & time: 05/08/19  1749      History   Chief Complaint Chief Complaint  Patient presents with  . Covid Exposure    HPI Charles Morrison is a 38 y.o. male.   Patient here concerned with exposure to COVID19.  He was with a friend 5 days ago who later tested positive.  He reports he and his friend wore masks around each other at all times. Patient denies any symptoms and states he feels well. He denies fever, chills, HA, nasal congestion, rhinorrhea, sore throat, cough, wheezing, SOB, nausea, vomiting, diarrhea, abdominal pain, loss of taste or smell.  No advil or tylenol today.     Past Medical History:  Diagnosis Date  . Medical history non-contributory     Patient Active Problem List   Diagnosis Date Noted  . Healthcare maintenance 09/18/2018  . HIV disease (HCC) 05/30/2018  . Hypoxemia   . 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 Anemia 05/14/2018  . Acute hypoxemic respiratory failure (HCC) 05/14/2018  . Allergy to amoxicillin 12/13/2017  . Pre-syncope 12/13/2017  . AKI (acute kidney injury) (HCC) 12/13/2017  . Hypokalemia 12/13/2017  . Allergic reaction 12/13/2017    Past Surgical History:  Procedure Laterality Date  . NO PAST SURGERIES         Home Medications    Prior to Admission medications   Medication Sig Start Date End Date Taking? Authorizing Provider  albuterol (PROVENTIL HFA;VENTOLIN HFA) 108 (90 Base) MCG/ACT inhaler Inhale 2 puffs into the lungs every 6 (six) hours as needed for wheezing or shortness of breath. 2 puffs 3 times daily x5 days then every 6 hours as needed. 05/20/18   Rodolph Bonghompson, Daniel V, MD  bictegravir-emtricitabine-tenofovir AF (BIKTARVY) 50-200-25 MG TABS tablet Take 1 tablet by mouth daily. 05/04/19   Veryl Speakalone, Gregory D, FNP  Multiple Vitamins-Minerals (MULTIVITAMIN ADULT) CHEW Chew 1 tablet by mouth 2 (two) times daily.    [provider]    Family History Family  History  Problem Relation Age of Onset  . Arthritis Mother        Knee    Social History Social History   Tobacco Use  . Smoking status: Never Smoker  . Smokeless tobacco: Never Used  Substance Use Topics  . Alcohol use: Yes    Comment: Occasion  . Drug use: No     Allergies   Amoxicillin and Shellfish allergy   Review of Systems Review of Systems  Constitutional: Negative for activity change, appetite change, chills, fatigue and fever.  HENT: Negative for congestion, ear pain, nosebleeds, rhinorrhea, sinus pressure, sinus pain, sneezing and sore throat.   Eyes: Negative for discharge and redness.  Respiratory: Negative for cough, shortness of breath and wheezing.   Gastrointestinal: Negative for abdominal distention, abdominal pain, constipation, diarrhea, nausea and vomiting.  Musculoskeletal: Negative for arthralgias and myalgias.  Skin: Negative for pallor and rash.  Neurological: Negative for dizziness, light-headedness and headaches.  Hematological: Negative for adenopathy.  Psychiatric/Behavioral: Negative for confusion and sleep disturbance.     Physical Exam Triage Vital Signs ED Triage Vitals  Enc Vitals Group     BP 05/08/19 1815 (!) 144/95     Pulse Rate 05/08/19 1815 79     Resp 05/08/19 1815 16     Temp 05/08/19 1815 98.3 F (36.8 C)     Temp src --      SpO2 05/08/19 1815 100 %  Weight --      Height --      Head Circumference --      Peak Flow --      Pain Score 05/08/19 1817 0     Pain Loc --      Pain Edu? --      Excl. in GC? --    No data found.  Updated Vital Signs BP (!) 144/95   Pulse 79   Temp 98.3 F (36.8 C)   Resp 16   SpO2 100%   Visual Acuity Right Eye Distance:   Left Eye Distance:   Bilateral Distance:    Right Eye Near:   Left Eye Near:    Bilateral Near:     Physical Exam Vitals signs and nursing note reviewed.  Constitutional:      General: He is not in acute distress.    Appearance: Normal  appearance. He is well-developed. He is obese. He is not ill-appearing or toxic-appearing.  HENT:     Head: Normocephalic and atraumatic.     Right Ear: Tympanic membrane, ear canal and external ear normal.     Left Ear: Tympanic membrane, ear canal and external ear normal.     Nose: Nose normal. No congestion or rhinorrhea.  Eyes:     General: No scleral icterus.    Extraocular Movements: Extraocular movements intact.     Conjunctiva/sclera: Conjunctivae normal.     Pupils: Pupils are equal, round, and reactive to light.  Neck:     Musculoskeletal: Normal range of motion and neck supple. No neck rigidity.  Cardiovascular:     Rate and Rhythm: Normal rate and regular rhythm.     Heart sounds: No murmur.  Pulmonary:     Effort: Pulmonary effort is normal. No respiratory distress.     Breath sounds: Normal breath sounds. No wheezing, rhonchi or rales.  Abdominal:     General: Bowel sounds are normal. There is no distension.     Palpations: Abdomen is soft.     Tenderness: There is no abdominal tenderness. There is no guarding.  Musculoskeletal: Normal range of motion.        General: No deformity.  Lymphadenopathy:     Cervical: No cervical adenopathy.  Skin:    General: Skin is warm and dry.     Capillary Refill: Capillary refill takes less than 2 seconds.  Neurological:     General: No focal deficit present.     Mental Status: He is alert and oriented to person, place, and time.  Psychiatric:        Mood and Affect: Mood normal.        Behavior: Behavior normal.      UC Treatments / Results  Labs (all labs ordered are listed, but only abnormal results are displayed) Labs Reviewed  NOVEL CORONAVIRUS, NAA (HOSPITAL ORDER, SEND-OUT TO REF LAB)    EKG   Radiology No results found.  Procedures Procedures (including critical care time)  Medications Ordered in UC Medications - No data to display  Initial Impression / Assessment and Plan / UC Course  I have  reviewed the triage vital signs and the nursing notes.  Pertinent labs & imaging results that were available during my care of the patient were reviewed by me and considered in my medical decision making (see chart for details).     Discussed possibility of false negative with patient. Recommend remain in self isolation until 2 weeks from exposure. If you develop symptoms  you may wish to get retested. We will call you if your test results are positive, otherwise you can get results through mychart when they become available. Final Clinical Impressions(s) / UC Diagnoses   Final diagnoses:  Screening for viral disease     Discharge Instructions     Discussed possibility of false negative with patient. Recommend remain in self isolation until 2 weeks from exposure. If you develop symptoms you may wish to get retested. We will call you if your test results are positive, otherwise you can get results through mychart when they become available.   ED Prescriptions    None     Controlled Substance Prescriptions [place] Controlled Substance Registry consulted? Not Applicable   Peri Jefferson, Hershal Coria 05/08/19 1836

## 2019-05-08 NOTE — Discharge Instructions (Addendum)
Discussed possibility of false negative with patient. Recommend remain in self isolation until 2 weeks from exposure. If you develop symptoms you may wish to get retested. We will call you if your test results are positive, otherwise you can get results through mychart when they become available.

## 2019-05-08 NOTE — ED Triage Notes (Signed)
Pt states he was exposed to someone who had covid on Monday, 8/10. Denies symptoms.

## 2019-05-11 ENCOUNTER — Telehealth (HOSPITAL_COMMUNITY): Payer: Self-pay | Admitting: Emergency Medicine

## 2019-05-11 ENCOUNTER — Encounter (HOSPITAL_COMMUNITY): Payer: Self-pay

## 2019-05-11 LAB — NOVEL CORONAVIRUS, NAA (HOSP ORDER, SEND-OUT TO REF LAB; TAT 18-24 HRS): SARS-CoV-2, NAA: DETECTED — AB

## 2019-05-11 NOTE — Telephone Encounter (Signed)
Patient contacted and made aware of  covid  results, all questions answered  

## 2019-05-11 NOTE — Telephone Encounter (Signed)
Your test for COVID-19 was positive, meaning that you were infected with the novel coronavirus and could give the germ to others.  Please continue isolation at home, for at least 10 days since the start of your fever/cough/breathlessness and until you have had 3 consecutive days without fever (without taking a fever reducer) and with cough/breathlessness improving. Please continue good preventive care measures, including:  frequent hand-washing, avoid touching your face, cover coughs/sneezes, stay out of crowds and keep a 6 foot distance from others.  Recheck or go to the nearest hospital ED tent for re-assessment if fever/cough/breathlessness return.  Attempted to reach patient. No answer at this time. Voicemail left.    

## 2019-05-21 MED FILL — BIKTARVY 50-200-25 MG TABS: 50-200-25 | 30 days supply | Qty: 30 | Fill #0

## 2019-06-16 MED FILL — BIKTARVY 50-200-25 MG TABS: 50-200-25 | 30 days supply | Qty: 30 | Fill #1

## 2019-07-09 MED FILL — BIKTARVY 50-200-25 MG TABS: 50-200-25 | 30 days supply | Qty: 30 | Fill #2

## 2019-08-06 MED FILL — BIKTARVY 50-200-25 MG TABS: 50-200-25 | 30 days supply | Qty: 30 | Fill #3

## 2019-09-02 MED FILL — BIKTARVY 50-200-25 MG TABS: 50-200-25 | 30 days supply | Qty: 30 | Fill #2

## 2019-09-03 ENCOUNTER — Other Ambulatory Visit: Payer: Self-pay

## 2019-09-03 ENCOUNTER — Other Ambulatory Visit: Payer: 59

## 2019-09-03 DIAGNOSIS — B20 Human immunodeficiency virus [HIV] disease: Secondary | ICD-10-CM

## 2019-09-04 LAB — T-HELPER CELL (CD4) - (RCID CLINIC ONLY)
CD4 % Helper T Cell: 16 % — ABNORMAL LOW (ref 33–65)
CD4 T Cell Abs: 363 /uL — ABNORMAL LOW (ref 400–1790)

## 2019-09-12 LAB — COMPREHENSIVE METABOLIC PANEL
AG Ratio: 1 (calc) (ref 1.0–2.5)
ALT: 19 U/L (ref 9–46)
AST: 17 U/L (ref 10–40)
Albumin: 3.9 g/dL (ref 3.6–5.1)
Alkaline phosphatase (APISO): 78 U/L (ref 36–130)
BUN: 14 mg/dL (ref 7–25)
CO2: 28 mmol/L (ref 20–32)
Calcium: 9.5 mg/dL (ref 8.6–10.3)
Chloride: 104 mmol/L (ref 98–110)
Creat: 1.15 mg/dL (ref 0.60–1.35)
Globulin: 3.9 g/dL (calc) — ABNORMAL HIGH (ref 1.9–3.7)
Glucose, Bld: 84 mg/dL (ref 65–99)
Potassium: 4 mmol/L (ref 3.5–5.3)
Sodium: 139 mmol/L (ref 135–146)
Total Bilirubin: 0.3 mg/dL (ref 0.2–1.2)
Total Protein: 7.8 g/dL (ref 6.1–8.1)

## 2019-09-12 LAB — HIV-1 RNA QUANT-NO REFLEX-BLD
HIV 1 RNA Quant: <20 copies/mL
HIV-1 RNA Quant, Log: <1.3 Log copies/mL

## 2019-09-21 ENCOUNTER — Encounter: Payer: Self-pay | Admitting: Family

## 2019-09-21 ENCOUNTER — Other Ambulatory Visit: Payer: Self-pay

## 2019-09-21 ENCOUNTER — Ambulatory Visit: Payer: 59 | Admitting: Family

## 2019-09-21 VITALS — BP 129/81 | HR 77 | Wt 284.8 lb

## 2019-09-21 DIAGNOSIS — Z0000 Encounter for general adult medical examination without abnormal findings: Secondary | ICD-10-CM | POA: Diagnosis not present

## 2019-09-21 DIAGNOSIS — B20 Human immunodeficiency virus [HIV] disease: Secondary | ICD-10-CM

## 2019-09-21 DIAGNOSIS — Z79899 Other long term (current) drug therapy: Secondary | ICD-10-CM | POA: Diagnosis not present

## 2019-09-21 DIAGNOSIS — Z113 Encounter for screening for infections with a predominantly sexual mode of transmission: Secondary | ICD-10-CM

## 2019-09-21 MED ORDER — BIKTARVY 50-200-25 MG PO TABS: 1.0000 | ORAL_TABLET | Freq: Every day | ORAL | 5 refills | Status: DC

## 2019-09-21 NOTE — Assessment & Plan Note (Signed)
Mr. Nijjar continues to have well-controlled HIV disease with good adherence and tolerance to his ART regimen of Biktarvy.  No signs/symptoms of opportunistic infection or progressive HIV disease.  We reviewed his lab work and discussed the plan of care.  Continue current dose of Biktarvy.  Plan for follow-up in 4 months or sooner if needed with lab work 1 to 2 weeks prior to appointment.

## 2019-09-21 NOTE — Patient Instructions (Addendum)
Nice to see you.   Continue to take your [place] as prescribed daily.  Refills will be sent to the pharmacy.  Plan for follow up in 4 months or sooner if needed with lab work 1-2 weeks prior to appointment.   Check out [email or web address]

## 2019-09-21 NOTE — Progress Notes (Signed)
Subjective:    Patient ID: Charles Morrison, male    DOB: 02/26/1981, 38 y.o.   MRN: [id number]  Chief Complaint  Patient presents with  . HIV Positive/AIDS    no complaints; received flu shot; declined condoms;     HPI:  Charles Morrison is a 38 y.o. male with HIV disease who was last seen in the office on 05/04/2019 with good adherence and tolerance to his ART regimen of Biktarvy.  Viral load was undetectable and CD4 count was 262.  Most recent blood work completed on 09/03/2019 with viral load that remains undetectable and CD4 count of 363.  Kidney function, liver function, electrolytes within normal ranges.  Influenza vaccination updated on 06/23/2019.  Charles Morrison continues to take his Biktarvy as prescribed no adverse side effects or missed doses since his last office visit.  Overall feeling well today with no new concerns/complaints. Denies fevers, chills, night sweats, headaches, changes in vision, neck pain/stiffness, nausea, diarrhea, vomiting, lesions or rashes.  Charles Morrison has no problems obtaining his medication from the pharmacy and remains covered through [employer].  Denies feelings of being down, depressed, or hopeless recently.  Denies any recreational or illicit drug use, tobacco use, or alcohol consumption.  He continues to work full-time and transitions between home and office.  Not currently sexually active with no new partners.  Recently found out over Thanksgiving that his sister may have a brain tumor.   Allergies  Allergen Reactions  . Amoxicillin Rash    Also has mild lip and facial swelling  . Shellfish Allergy Rash    Also had mild lip and facial swelling      Outpatient Medications Prior to Visit  Medication Sig Dispense Refill  . albuterol (PROVENTIL HFA;VENTOLIN HFA) 108 (90 Base) MCG/ACT inhaler Inhale 2 puffs into the lungs every 6 (six) hours as needed for wheezing or shortness of breath. 2 puffs 3 times daily x5 days then every 6 hours as  needed. 1 Inhaler 0  . Multiple Vitamins-Minerals (MULTIVITAMIN ADULT) CHEW Chew 1 tablet by mouth 2 (two) times daily.    . bictegravir-emtricitabine-tenofovir AF (BIKTARVY) 50-200-25 MG TABS tablet Take 1 tablet by mouth daily. 30 tablet 5   No facility-administered medications prior to visit.     Past Medical History:  Diagnosis Date  . Medical history non-contributory      Past Surgical History:  Procedure Laterality Date  . NO PAST SURGERIES      Review of Systems  Constitutional: Negative for appetite change, chills, fatigue, fever and unexpected weight change.  Eyes: Negative for visual disturbance.  Respiratory: Negative for cough, chest tightness, shortness of breath and wheezing.   Cardiovascular: Negative for chest pain and leg swelling.  Gastrointestinal: Negative for abdominal pain, constipation, diarrhea, nausea and vomiting.  Genitourinary: Negative for dysuria, flank pain, frequency, genital sores, hematuria and urgency.  Skin: Negative for rash.  Allergic/Immunologic: Negative for immunocompromised state.  Neurological: Negative for dizziness and headaches.      Objective:    BP 129/81   Pulse 77   Wt 284 lb 12.8 oz (129.2 kg)   BMI 40.86 kg/m  Nursing note and vital signs reviewed.  Physical Exam Constitutional:      General: He is not in acute distress.    Appearance: He is well-developed.  Eyes:     Conjunctiva/sclera: Conjunctivae normal.  Cardiovascular:     Rate and Rhythm: Normal rate and regular rhythm.     Heart sounds: Normal heart sounds.  No murmur. No friction rub. No gallop.   Pulmonary:     Effort: Pulmonary effort is normal. No respiratory distress.     Breath sounds: Normal breath sounds. No wheezing or rales.  Chest:     Chest wall: No tenderness.  Abdominal:     General: Bowel sounds are normal.     Palpations: Abdomen is soft.     Tenderness: There is no abdominal tenderness.  Musculoskeletal:     Cervical back: Neck  supple.  Lymphadenopathy:     Cervical: No cervical adenopathy.  Skin:    General: Skin is warm and dry.     Findings: No rash.  Neurological:     Mental Status: He is alert and oriented to person, place, and time.  Psychiatric:        Behavior: Behavior normal.        Thought Content: Thought content normal.        Judgment: Judgment normal.      Depression screen [facility] 2/9 09/21/2019 05/04/2019 09/18/2018 07/31/2018 06/30/2018  Decreased Interest 0 0 0 0 0  Down, Depressed, Hopeless 0 0 0 0 0  PHQ - 2 Score 0 0 0 0 0       Assessment & Plan:    Patient Active Problem List   Diagnosis Date Noted  . Healthcare maintenance 09/18/2018  . HIV disease ([place]) 05/30/2018  . Hypoxemia   . 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 Anemia 05/14/2018  . Acute hypoxemic respiratory failure ([place]) 05/14/2018  . Allergy to amoxicillin 12/13/2017  . Pre-syncope 12/13/2017  . AKI (acute kidney injury) ([place]) 12/13/2017  . Hypokalemia 12/13/2017  . Allergic reaction 12/13/2017     Problem List Items Addressed This Visit      Other   HIV disease ([place]) - Primary    Mr. Fuston continues to have well-controlled HIV disease with good adherence and tolerance to his ART regimen of Biktarvy.  No signs/symptoms of opportunistic infection or progressive HIV disease.  We reviewed his lab work and discussed the plan of care.  Continue current dose of Biktarvy.  Plan for follow-up in 4 months or sooner if needed with lab work 1 to 2 weeks prior to appointment.      Relevant Medications   bictegravir-emtricitabine-tenofovir AF (BIKTARVY) 50-200-25 MG TABS tablet   Other Relevant Orders   3 month T-helper cell (CD4)- ([facility] clinic only)   3 month VL   3 month [place] maintenance     All vaccinations are up-to-date per recommendations.  Currently awaiting referral to [place] [facility]  Discussed importance of safe sexual practice to reduce risk of STI.  Condoms declined.       Other  Visit Diagnoses    Screening for STDs (sexually transmitted diseases)       Relevant Orders   3 month RPR   Urine cytology ancillary only([place])   Pharmacologic therapy       Relevant Orders   Lipid Profile       I am having Terie Purser maintain his Multivitamin Adult, albuterol, and Biktarvy.   Meds ordered this encounter  Medications  . bictegravir-emtricitabine-tenofovir AF (BIKTARVY) 50-200-25 MG TABS tablet    Sig: Take 1 tablet by mouth daily.    Dispense:  30 tablet    Refill:  5    Order Specific Question:   Supervising Provider    Answer:   Carlyle Basques [[id number]]     Follow-up: Return in about  4 months (around 01/20/2020), or if symptoms worsen or fail to improve.   Marcos Eke, MSN, FNP-C Nurse Practitioner [facility] Main number: [phone]

## 2019-09-21 NOTE — Assessment & Plan Note (Signed)
   All vaccinations are up-to-date per recommendations.  Currently awaiting referral to [place] clinic  Discussed importance of safe sexual practice to reduce risk of STI.  Condoms declined.

## 2019-09-28 ENCOUNTER — Other Ambulatory Visit: Payer: Self-pay | Admitting: *Deleted

## 2019-09-28 DIAGNOSIS — B20 Human immunodeficiency virus [HIV] disease: Secondary | ICD-10-CM

## 2019-09-28 MED ORDER — BIKTARVY 50-200-25 MG PO TABS: 1.0000 | ORAL_TABLET | Freq: Every day | ORAL | 5 refills | Status: DC

## 2019-10-01 MED FILL — BIKTARVY 50-200-25 MG TABS: 50-200-25 | 30 days supply | Qty: 30 | Fill #3

## 2019-10-30 MED FILL — BIKTARVY 50-200-25 MG TABS: 50-200-25 | 30 days supply | Qty: 30 | Fill #4

## 2019-11-30 MED FILL — BIKTARVY 50-200-25 MG TABS: 50-200-25 | 30 days supply | Qty: 30 | Fill #5

## 2019-12-03 ENCOUNTER — Ambulatory Visit: Payer: 59 | Attending: Internal Medicine

## 2019-12-03 DIAGNOSIS — Z23 Encounter for immunization: Secondary | ICD-10-CM

## 2019-12-03 NOTE — Progress Notes (Signed)
   Covid-19 Vaccination Clinic  Name:  Charles Morrison    MRN: [id number] DOB: 1981/05/09  12/03/2019  [name] was observed post Covid-19 immunization for 15 minutes without incident. He was provided with Vaccine Information Sheet and instruction to access the V-Safe system.   [name] was instructed to call 911 with any severe reactions post vaccine: Marland Kitchen Difficulty breathing  . Swelling of face and throat  . A fast heartbeat  . A bad rash all over body  . Dizziness and weakness   Immunizations Administered    Name Date Dose VIS Date Route   Pfizer COVID-19 Vaccine 12/03/2019 10:59 AM 0.3 mL 09/04/2019 Intramuscular   Manufacturer: [employer], [employer]   Lot: [id number]   NDC: [address]-2

## 2019-12-24 MED FILL — BIKTARVY 50-200-25 MG TABS: 50-200-25 | 30 days supply | Qty: 30 | Fill #0

## 2019-12-29 ENCOUNTER — Ambulatory Visit: Payer: 59 | Attending: Internal Medicine

## 2019-12-29 DIAGNOSIS — Z23 Encounter for immunization: Secondary | ICD-10-CM

## 2019-12-29 NOTE — Progress Notes (Signed)
   Covid-19 Vaccination Clinic  Name:  Charles Morrison    MRN: [id number] DOB: 07/14/1981  12/29/2019  Charles Morrison was observed post Covid-19 immunization for 15 minutes without incident. He was provided with Vaccine Information Sheet and instruction to access the V-Safe system.   Charles Morrison was instructed to call 911 with any severe reactions post vaccine: Marland Kitchen Difficulty breathing  . Swelling of face and throat  . A fast heartbeat  . A bad rash all over body  . Dizziness and weakness   Immunizations Administered    Name Date Dose VIS Date Route   Pfizer COVID-19 Vaccine 12/29/2019  9:34 AM 0.3 mL 09/04/2019 Intramuscular   Manufacturer: [employer], [employer]   Lot: [id number]   NDC: [address]-4

## 2020-01-20 ENCOUNTER — Other Ambulatory Visit: Payer: 59

## 2020-01-21 ENCOUNTER — Other Ambulatory Visit: Payer: 59

## 2020-01-21 ENCOUNTER — Other Ambulatory Visit: Payer: Self-pay

## 2020-01-21 DIAGNOSIS — Z79899 Other long term (current) drug therapy: Secondary | ICD-10-CM

## 2020-01-21 DIAGNOSIS — B20 Human immunodeficiency virus [HIV] disease: Secondary | ICD-10-CM

## 2020-01-21 DIAGNOSIS — Z113 Encounter for screening for infections with a predominantly sexual mode of transmission: Secondary | ICD-10-CM

## 2020-01-21 MED FILL — BIKTARVY 50-200-25 MG TABS: 50-200-25 | 30 days supply | Qty: 30 | Fill #1

## 2020-01-22 LAB — COMPREHENSIVE METABOLIC PANEL
AG Ratio: 1.2 (calc) (ref 1.0–2.5)
ALT: 16 U/L (ref 9–46)
AST: 18 U/L (ref 10–40)
Albumin: 4.2 g/dL (ref 3.6–5.1)
Alkaline phosphatase (APISO): 78 U/L (ref 36–130)
BUN: 13 mg/dL (ref 7–25)
CO2: 27 mmol/L (ref 20–32)
Calcium: 9.2 mg/dL (ref 8.6–10.3)
Chloride: 103 mmol/L (ref 98–110)
Creat: 1.08 mg/dL (ref 0.60–1.35)
Globulin: 3.6 g/dL (calc) (ref 1.9–3.7)
Glucose, Bld: 97 mg/dL (ref 65–99)
Potassium: 4 mmol/L (ref 3.5–5.3)
Sodium: 138 mmol/L (ref 135–146)
Total Bilirubin: 0.4 mg/dL (ref 0.2–1.2)
Total Protein: 7.8 g/dL (ref 6.1–8.1)

## 2020-01-22 LAB — HIV-1 RNA QUANT-NO REFLEX-BLD
HIV 1 RNA Quant: <20 copies/mL
HIV-1 RNA Quant, Log: <1.3 Log copies/mL

## 2020-01-22 LAB — LIPID PANEL
Cholesterol: 209 mg/dL — ABNORMAL HIGH (ref <200)
HDL: 53 mg/dL (ref >=40)
LDL Cholesterol (Calc): 142 mg/dL (calc) — ABNORMAL HIGH
Non-HDL Cholesterol (Calc): 156 mg/dL (calc) — ABNORMAL HIGH (ref <130)
Total CHOL/HDL Ratio: 3.9 (calc) (ref <5.0)
Triglycerides: 52 mg/dL (ref <150)

## 2020-01-22 LAB — T-HELPER CELL (CD4) - (RCID CLINIC ONLY)
CD4 % Helper T Cell: 19 % — ABNORMAL LOW (ref 33–65)
CD4 T Cell Abs: 381 /uL — ABNORMAL LOW (ref 400–1790)

## 2020-01-22 LAB — RPR: RPR Ser Ql: NONREACTIVE

## 2020-02-09 ENCOUNTER — Encounter: Payer: 59 | Admitting: Family

## 2020-02-18 MED FILL — BIKTARVY 50-200-25 MG TABS: 50-200-25 | 30 days supply | Qty: 30 | Fill #2

## 2020-03-17 MED FILL — BIKTARVY 50-200-25 MG TABS: 50-200-25 | 30 days supply | Qty: 30 | Fill #3

## 2020-04-13 MED FILL — BIKTARVY 50-200-25 MG TABS: 50-200-25 | 30 days supply | Qty: 30 | Fill #4

## 2020-05-16 MED FILL — BIKTARVY 50-200-25 MG TABS: 50-200-25 | 30 days supply | Qty: 30 | Fill #5

## 2020-06-06 ENCOUNTER — Other Ambulatory Visit: Payer: Self-pay | Admitting: Family

## 2020-06-06 DIAGNOSIS — B20 Human immunodeficiency virus [HIV] disease: Secondary | ICD-10-CM

## 2020-06-07 ENCOUNTER — Telehealth: Payer: Self-pay

## 2020-06-07 NOTE — Telephone Encounter (Signed)
Called patient to get him scheduled for a follow up with provider and labs, voicemail is full and no answer

## 2020-06-09 MED FILL — BIKTARVY 50-200-25 MG TABS: 50-200-25 | 30 days supply | Qty: 30 | Fill #0

## 2020-07-01 ENCOUNTER — Other Ambulatory Visit: Payer: Self-pay | Admitting: Family

## 2020-07-01 DIAGNOSIS — B20 Human immunodeficiency virus [HIV] disease: Secondary | ICD-10-CM

## 2020-07-04 MED FILL — BIKTARVY 50-200-25 MG TABS: 50-200-25 | 30 days supply | Qty: 30 | Fill #0

## 2020-07-06 ENCOUNTER — Other Ambulatory Visit: Payer: Self-pay

## 2020-07-06 DIAGNOSIS — Z113 Encounter for screening for infections with a predominantly sexual mode of transmission: Secondary | ICD-10-CM

## 2020-07-06 DIAGNOSIS — B20 Human immunodeficiency virus [HIV] disease: Secondary | ICD-10-CM

## 2020-07-12 ENCOUNTER — Other Ambulatory Visit: Payer: Self-pay

## 2020-07-12 ENCOUNTER — Other Ambulatory Visit: Payer: 59

## 2020-07-12 DIAGNOSIS — Z113 Encounter for screening for infections with a predominantly sexual mode of transmission: Secondary | ICD-10-CM

## 2020-07-12 DIAGNOSIS — B20 Human immunodeficiency virus [HIV] disease: Secondary | ICD-10-CM

## 2020-07-13 LAB — T-HELPER CELL (CD4) - (RCID CLINIC ONLY)
CD4 % Helper T Cell: 20 % — ABNORMAL LOW (ref 33–65)
CD4 T Cell Abs: 348 /uL — ABNORMAL LOW (ref 400–1790)

## 2020-07-14 LAB — COMPLETE METABOLIC PANEL WITH GFR
AG Ratio: 0.9 (calc) — ABNORMAL LOW (ref 1.0–2.5)
ALT: 18 U/L (ref 9–46)
AST: 20 U/L (ref 10–40)
Albumin: 3.8 g/dL (ref 3.6–5.1)
Alkaline phosphatase (APISO): 87 U/L (ref 36–130)
BUN: 16 mg/dL (ref 7–25)
CO2: 26 mmol/L (ref 20–32)
Calcium: 9 mg/dL (ref 8.6–10.3)
Chloride: 102 mmol/L (ref 98–110)
Creat: 1 mg/dL (ref 0.60–1.35)
GFR, Est African American: 109 mL/min/{1.73_m2} (ref >=60)
GFR, Est Non African American: 94 mL/min/{1.73_m2} (ref >=60)
Globulin: 4.3 g/dL (calc) — ABNORMAL HIGH (ref 1.9–3.7)
Glucose, Bld: 104 mg/dL — ABNORMAL HIGH (ref 65–99)
Potassium: 4.3 mmol/L (ref 3.5–5.3)
Sodium: 136 mmol/L (ref 135–146)
Total Bilirubin: 0.5 mg/dL (ref 0.2–1.2)
Total Protein: 8.1 g/dL (ref 6.1–8.1)

## 2020-07-14 LAB — CBC WITH DIFFERENTIAL/PLATELET
Absolute Monocytes: 451 cells/uL (ref 200–950)
Basophils Absolute: 39 cells/uL (ref 0–200)
Basophils Relative: 0.8 %
Eosinophils Absolute: 279 cells/uL (ref 15–500)
Eosinophils Relative: 5.7 %
HCT: 42.5 % (ref 38.5–50.0)
Hemoglobin: 13.7 g/dL (ref 13.2–17.1)
Lymphs Abs: 1842 cells/uL (ref 850–3900)
MCH: 26.1 pg — ABNORMAL LOW (ref 27.0–33.0)
MCHC: 32.2 g/dL (ref 32.0–36.0)
MCV: 81.1 fL (ref 80.0–100.0)
MPV: 9.2 fL (ref 7.5–12.5)
Monocytes Relative: 9.2 %
Neutro Abs: 2288 cells/uL (ref 1500–7800)
Neutrophils Relative %: 46.7 %
Platelets: 341 10*3/uL (ref 140–400)
RBC: 5.24 10*6/uL (ref 4.20–5.80)
RDW: 17 % — ABNORMAL HIGH (ref 11.0–15.0)
Total Lymphocyte: 37.6 %
WBC: 4.9 10*3/uL (ref 3.8–10.8)

## 2020-07-14 LAB — FLUORESCENT TREPONEMAL AB(FTA)-IGG-BLD: Fluorescent Treponemal ABS: REACTIVE — AB

## 2020-07-14 LAB — HIV-1 RNA QUANT-NO REFLEX-BLD
HIV 1 RNA Quant: <20 Copies/mL
HIV-1 RNA Quant, Log: <1.3 Log cps/mL

## 2020-07-14 LAB — RPR TITER: RPR Titer: 1:128 {titer} — ABNORMAL HIGH

## 2020-07-14 LAB — RPR: RPR Ser Ql: REACTIVE — AB

## 2020-07-20 ENCOUNTER — Telehealth: Payer: Self-pay | Admitting: *Deleted

## 2020-07-20 NOTE — Telephone Encounter (Signed)
RN left message asking patient to call back regarding his upcoming appointment. Will need to relay results to patient and offer nurse visit for treatment, or treatment at office visit 11/2. Andree Coss, RN

## 2020-07-20 NOTE — Telephone Encounter (Signed)
Please advise on most recent RPR. DIS requesting treatment information. Patient scheduled to see Tammy Sours 11/2. Treat at visit? Andree Coss, RN

## 2020-07-20 NOTE — Telephone Encounter (Signed)
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 I am okay with treating now or with treating at appointment. Would give Charles Morrison the choice.

## 2020-07-21 NOTE — Telephone Encounter (Signed)
Left vm requesting call back to discuss lab results and schedule appt.   Deriyah Kunath Loyola Mast, RN

## 2020-07-22 NOTE — Telephone Encounter (Signed)
Sent patient a MyChart message to contact our office to discuss most recent lab results.  Valarie Cones

## 2020-07-26 ENCOUNTER — Ambulatory Visit: Payer: 59 | Admitting: Family

## 2020-07-26 ENCOUNTER — Other Ambulatory Visit: Payer: Self-pay

## 2020-07-26 ENCOUNTER — Encounter: Payer: Self-pay | Admitting: Family

## 2020-07-26 ENCOUNTER — Other Ambulatory Visit: Payer: Self-pay | Admitting: Family

## 2020-07-26 VITALS — BP 129/85 | HR 80 | Temp 97.7°F | Ht 70.0 in | Wt 270.0 lb

## 2020-07-26 DIAGNOSIS — A539 Syphilis, unspecified: Secondary | ICD-10-CM | POA: Diagnosis not present

## 2020-07-26 DIAGNOSIS — Z0000 Encounter for general adult medical examination without abnormal findings: Secondary | ICD-10-CM | POA: Diagnosis not present

## 2020-07-26 DIAGNOSIS — B20 Human immunodeficiency virus [HIV] disease: Secondary | ICD-10-CM

## 2020-07-26 DIAGNOSIS — Z23 Encounter for immunization: Secondary | ICD-10-CM

## 2020-07-26 MED ORDER — BIKTARVY 50-200-25 MG PO TABS: 1.0000 | ORAL_TABLET | Freq: Every day | ORAL | 5 refills | Status: DC

## 2020-07-26 MED ORDER — DOXYCYCLINE HYCLATE 100 MG PO TABS: 100.0000 mg | ORAL_TABLET | Freq: Two times a day (BID) | ORAL | 0 refills | Status: DC

## 2020-07-26 NOTE — Assessment & Plan Note (Signed)
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  [facility] when nonreactive and had previous exposure.  He has not been sexually active since per his report.  We will treat with doxycycline for 14 days for early latent syphilis.  Continue to monitor RPR.

## 2020-07-26 NOTE — Patient Instructions (Addendum)
Nice to see you.   We will continue your Biktarvy.  Refills have been sent to the pharmacy.  Start taking Doxycycline.   Plan for follow up in 4 months or sooner if needed with lab work 1-2 weeks prior to appointment.   Have a great day and stay safe!

## 2020-07-26 NOTE — Progress Notes (Signed)
Subjective:    Patient ID: Charles Morrison, male    DOB: [birth year], 39 y.o.   MRN: [id number]  Chief Complaint  Patient presents with  . Follow-up    declined condoms      HPI:  Charles Morrison is a 39 y.o. male with HIV disease who was last seen in the office on 09/21/2019 with good adherence and tolerance to his ART regimen Biktarvy.  Interim blood work showed a viral load that was undetectable and CD4 count of 381.  Most recent blood work completed on 07/12/2020 with a viral load that remains undetectable and CD4 count of 348.  RPR was positive at 1: 128.  Previously nonreactive.  Kidney function, liver function, electrolytes within normal ranges.  Here today for routine follow-up.  Charles Morrison continues to take his Biktarvy daily as prescribed with no adverse side effects or missed doses since his last office visit.  Overall feeling well today with no new concerns/complaints.  He was previously treated for syphilis at the health department back in May with 14 days of doxycycline. Denies fevers, chills, night sweats, headaches, changes in vision, neck pain/stiffness, nausea, diarrhea, vomiting, lesions or rashes.  Charles Morrison has no problems obtaining his medication from the pharmacy remains covered [place] [employer].  Denies any feelings of being down, depressed, or hopeless recently.  No recreational or illicit drug use, tobacco use, and occasional alcohol consumption. Interested in flu shot today. Has appointment set for dentist.   Allergies  Allergen Reactions  . Amoxicillin Rash    Also has mild lip and facial swelling  . Shellfish Allergy Rash    Also had mild lip and facial swelling      Outpatient Medications Prior to Visit  Medication Sig Dispense Refill  . albuterol (PROVENTIL HFA;VENTOLIN HFA) 108 (90 Base) MCG/ACT inhaler Inhale 2 puffs into the lungs every 6 (six) hours as needed for wheezing or shortness of breath. 2 puffs 3 times daily x5 days then every 6 hours  as needed. 1 Inhaler 0  . Multiple Vitamins-Minerals (MULTIVITAMIN ADULT) CHEW Chew 1 tablet by mouth 2 (two) times daily.    Marland Kitchen BIKTARVY 50-200-25 MG TABS tablet TAKE 1 TABLET BY MOUTH DAILY. 30 tablet 0   No facility-administered medications prior to visit.     Past Medical History:  Diagnosis Date  . HIV infection ([facility])   . Medical history non-contributory      Past Surgical History:  Procedure Laterality Date  . NO PAST SURGERIES         Review of Systems  Constitutional: Negative for appetite change, chills, fatigue, fever and unexpected weight change.  Eyes: Negative for visual disturbance.  Respiratory: Negative for cough, chest tightness, shortness of breath and wheezing.   Cardiovascular: Negative for chest pain and leg swelling.  Gastrointestinal: Negative for abdominal pain, constipation, diarrhea, nausea and vomiting.  Genitourinary: Negative for dysuria, flank pain, frequency, genital sores, hematuria and urgency.  Skin: Negative for rash.  Allergic/Immunologic: Negative for immunocompromised state.  Neurological: Negative for dizziness and headaches.      Objective:    BP 129/85   Pulse 80   Temp 97.7 F (36.5 C) (Oral)   Ht 5\' 10"  (1.778 m)   Wt 270 lb (122.5 kg)   SpO2 99%   BMI 38.74 kg/m  Nursing note and vital signs reviewed.  Physical Exam Constitutional:      General: He is not in acute distress.    Appearance: He is well-developed.  Eyes:  Conjunctiva/sclera: Conjunctivae normal.  Cardiovascular:     Rate and Rhythm: Normal rate and regular rhythm.     Heart sounds: Normal heart sounds. No murmur heard.  No friction rub. No gallop.   Pulmonary:     Effort: Pulmonary effort is normal. No respiratory distress.     Breath sounds: Normal breath sounds. No wheezing or rales.  Chest:     Chest wall: No tenderness.  Abdominal:     General: Bowel sounds are normal.     Palpations: Abdomen is soft.     Tenderness: There is no abdominal  tenderness.  Musculoskeletal:     Cervical back: Neck supple.  Lymphadenopathy:     Cervical: No cervical adenopathy.  Skin:    General: Skin is warm and dry.     Findings: No rash.  Neurological:     Mental Status: He is alert and oriented to person, place, and time.  Psychiatric:        Behavior: Behavior normal.        Thought Content: Thought content normal.        Judgment: Judgment normal.      Depression screen [facility] 2/9 07/26/2020 09/21/2019 05/04/2019 09/18/2018 07/31/2018  Decreased Interest 0 0 0 0 0  Down, Depressed, Hopeless 0 0 0 0 0  PHQ - 2 Score 0 0 0 0 0       Assessment & Plan:    Patient Active Problem List   Diagnosis Date Noted  . Syphilis 07/26/2020  . Healthcare maintenance 09/18/2018  . HIV disease (H[facility]) 05/30/2018  . Hypoxemia   . 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 Anemia 05/14/2018  . Acute hypoxemic respiratory failure ([facility]) 05/14/2018  . Allergy to amoxicillin 12/13/2017  . Pre-syncope 12/13/2017  . AKI (acute kidney injury) ([facility]) 12/13/2017  . Hypokalemia 12/13/2017  . Allergic reaction 12/13/2017     Problem List Items Addressed This Visit      Other   HIV disease [facility])    Charles Morrison continues to have well-controlled HIV disease with good adherence and tolerance to his ART regimen of Biktarvy.  No signs/symptoms of opportunistic infection or progressive HIV disease.  We reviewed lab work and discussed plan of care.  Continue current dose of Biktarvy.  Plan for follow-up in 4 months or sooner if needed with lab work 1 to 2 weeks prior to appointment.      Relevant Medications   bictegravir-emtricitabine-tenofovir AF (BIKTARVY) 50-200-25 MG TABS tablet   Healthcare maintenance     Influenza updated today.  Discussed importance of safe sexual practices reduce risk of STI.  Condoms declined.  Routine dental care is scheduled.        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   Previously treated at the health department in May when nonreactive and had previous exposure.  He has not been sexually active since per his report.  We will treat with doxycycline for 14 days for early latent syphilis.  Continue to monitor RPR.      Relevant Medications   bictegravir-emtricitabine-tenofovir AF (BIKTARVY) 50-200-25 MG TABS tablet    Other Visit Diagnoses    Need for immunization against influenza    -  Primary   Relevant Orders   Flu Vaccine QUAD 36+ mos IM (Completed)       I am having Charles Morrison start on doxycycline. I am also having him maintain his Multivitamin Adult, albuterol,  and Biktarvy.   Meds ordered this encounter  Medications  . DISCONTD: bictegravir-emtricitabine-tenofovir AF (BIKTARVY) 50-200-25 MG TABS tablet    Sig: Take 1 tablet by mouth daily.    Dispense:  30 tablet    Refill:  5    Order Specific Question:   Supervising Provider    Answer:   Judyann Munson [[id number]]  . doxycycline (VIBRA-TABS) 100 MG tablet    Sig: Take 1 tablet (100 mg total) by mouth 2 (two) times daily.    Dispense:  28 tablet    Refill:  0    Order Specific Question:   Supervising Provider    Answer:   Judyann Munson [[id number]]  . bictegravir-emtricitabine-tenofovir AF (BIKTARVY) 50-200-25 MG TABS tablet    Sig: Take 1 tablet by mouth daily.    Dispense:  30 tablet    Refill:  5    Order Specific Question:   Supervising Provider    Answer:   Judyann Munson [[id number]]     Follow-up: Return in about 4 months (around 11/23/2020), or if symptoms worsen or fail to improve.   Marcos Eke, MSN, FNP-C Nurse Practitioner [facility] Main number: [phone]

## 2020-07-26 NOTE — Assessment & Plan Note (Signed)
   Influenza updated today.  Discussed importance of safe sexual practices reduce risk of STI.  Condoms declined.  Routine dental care is scheduled.

## 2020-07-26 NOTE — Assessment & Plan Note (Signed)
Charles Morrison continues to have well-controlled HIV disease with good adherence and tolerance to his ART regimen of Biktarvy.  No signs/symptoms of opportunistic infection or progressive HIV disease.  We reviewed lab work and discussed plan of care.  Continue current dose of Biktarvy.  Plan for follow-up in 4 months or sooner if needed with lab work 1 to 2 weeks prior to appointment. 

## 2020-08-02 MED FILL — BIKTARVY 50-200-25 MG TABS: 50-200-25 | 30 days supply | Qty: 30 | Fill #0

## 2020-08-29 MED FILL — BIKTARVY 50-200-25 MG TABS: 50-200-25 | 30 days supply | Qty: 30 | Fill #1

## 2020-08-31 ENCOUNTER — Encounter: Payer: Self-pay | Admitting: Allergy and Immunology

## 2020-08-31 ENCOUNTER — Ambulatory Visit: Payer: 59 | Admitting: Allergy and Immunology

## 2020-08-31 ENCOUNTER — Other Ambulatory Visit: Payer: Self-pay

## 2020-08-31 VITALS — BP 106/70 | HR 80 | Temp 97.1°F | Resp 16 | Ht 69.0 in | Wt 273.6 lb

## 2020-08-31 DIAGNOSIS — H1013 Acute atopic conjunctivitis, bilateral: Secondary | ICD-10-CM

## 2020-08-31 DIAGNOSIS — J3089 Other allergic rhinitis: Secondary | ICD-10-CM | POA: Diagnosis not present

## 2020-08-31 DIAGNOSIS — H1010 Acute atopic conjunctivitis, unspecified eye: Secondary | ICD-10-CM | POA: Insufficient documentation

## 2020-08-31 DIAGNOSIS — T7800XA Anaphylactic reaction due to unspecified food, initial encounter: Secondary | ICD-10-CM

## 2020-08-31 MED ORDER — AZELASTINE-FLUTICASONE 137-50 MCG/ACT NA SUSP: 1.0000 | Freq: Two times a day (BID) | NASAL | 5 refills | Status: DC | PRN

## 2020-08-31 MED ORDER — EPINEPHRINE 0.3 MG/0.3ML IJ SOAJ: 0.3000 mg | INTRAMUSCULAR | 1 refills | Status: DC | PRN

## 2020-08-31 MED ORDER — OLOPATADINE HCL 0.2 % OP SOLN: 1.0000 [drp] | Freq: Every day | OPHTHALMIC | 5 refills | Status: AC | PRN

## 2020-08-31 NOTE — Progress Notes (Signed)
New Patient Note  RE: Charles Morrison MRN: [id number] DOB: 06-03-1981 Date of Office Visit: 08/31/2020  Referring provider: No ref. provider found Primary care provider: Pcp, No  Chief Complaint: Allergic Rhinitis  and Conjunctivitis   History of present illness: Charles Morrison is a 39 y.o. male presenting today for evaluation of rhinoconjunctivitis.  He reports that over the past 6 years he has experienced frequent nasal congestion, rhinorrhea, sneezing, postnasal drainage, nasal pruritus, and ocular pruritus.  The symptoms occur year-round but are more frequent and severe during the springtime and in the fall.  The symptoms have progressed over the past year.  He attempts to control these symptoms with levocetirizine daily and Visine allergy eyedrops.  The patient is interested in the possibility of starting aeroallergen immunotherapy to reduce symptoms and decrease medication requirement. Charles Morrison reports that approximately 9 years ago he consumed shrimp and within minutes developed angioedema of the tongue and the sensation of throat closing.  He required evaluation and treatment in the emergency department.  He avoids shellfish and fish but does not have epinephrine autoinjectors.  Assessment and plan: Perennial and seasonal allergic rhinitis  Aeroallergen avoidance measures have been discussed and provided in written form.  To avoid diminishing benefit with daily use (tachyphylaxis) of second generation antihistamine, consider alternating every few months between fexofenadine (Allegra) and levocetirizine (Xyzal).  A prescription has been provided for azelastine/fluticasone nasal spray, 1 spray per nostril twice daily as needed. Proper nasal spray technique has been discussed and demonstrated.  Nasal saline spray (i.e., Simply Saline) or nasal saline lavage (i.e., NeilMed) is recommended as needed and prior to medicated nasal sprays.  If allergen avoidance measures and medications  fail to adequately relieve symptoms, aeroallergen immunotherapy will be considered.  Allergic conjunctivitis  Treatment plan as outlined above for allergic rhinitis.  A prescription has been provided for generic Pataday, one drop per eye daily as needed.  If insurance does not cover this medication, medicated allergy eyedrops may be purchased over-the-counter as Art therapist.  I have also recommended eye lubricant drops (i.e., Natural Tears) as needed.  Food allergy The patient's history suggests food allergy and positive skin test results today confirm this diagnosis.  Meticulous avoidance of shellfish and fish as discussed.  A prescription has been provided for epinephrine auto-injector (Auvi-Q) 2 pack along with instructions for proper administration.  A food allergy action plan has been provided and discussed.  Medic Alert identification is recommended.   Meds ordered this encounter  Medications  . EPINEPHrine (AUVI-Q) 0.3 mg/0.3 mL IJ SOAJ injection    Sig: Inject 0.3 mg into the muscle as needed for anaphylaxis.    Dispense:  1 each    Refill:  1  . Azelastine-Fluticasone (DYMISTA) 137-50 MCG/ACT SUSP    Sig: Place 1 spray into both nostrils 2 (two) times daily as needed.    Dispense:  23 g    Refill:  5  . Olopatadine HCl (PATADAY) 0.2 % SOLN    Sig: Place 1 drop into both eyes daily as needed.    Dispense:  2.5 mL    Refill:  5    Diagnostics: Environmental skin testing: Positive to grass pollen, weed pollen, ragweed pollen, tree pollen, molds, cat hair, dog epithelia, cockroach antigen, and dust mite antigen. Food allergen skin testing: Reactive to shellfish mix, shrimp, crab, lobster, fish mix, Trout, tuna, salmon, and flounder.    Physical examination: Blood pressure 106/70, pulse 80, temperature (!) 97.1 F (36.2  C), temperature source Tympanic, resp. rate 16, height 5\' 9"  (1.753 m), weight 273 lb 9.6 oz (124.1 kg), SpO2 98 %.  General:  Alert, interactive, in no acute distress. HEENT: TMs pearly gray, turbinates edematous with thick discharge, post-pharynx moderately erythematous. Neck: Supple without lymphadenopathy. Lungs: Clear to auscultation without wheezing, rhonchi or rales. CV: Normal S1, S2 without murmurs. Abdomen: Nondistended, nontender. Skin: Warm and dry, without lesions or rashes. Extremities:  No clubbing, cyanosis or edema. Neuro:   Grossly intact.  Review of systems:  Review of systems negative except as noted in HPI / PMHx or noted below: Review of Systems  Constitutional: Negative.   HENT: Negative.   Eyes: Negative.   Respiratory: Negative.   Cardiovascular: Negative.   Gastrointestinal: Negative.   Genitourinary: Negative.   Musculoskeletal: Negative.   Skin: Negative.   Neurological: Negative.   Endo/Heme/Allergies: Negative.   Psychiatric/Behavioral: Negative.     Past medical history:  Past Medical History:  Diagnosis Date  . HIV infection (HCC)   . Medical history non-contributory     Past surgical history:  Past Surgical History:  Procedure Laterality Date  . NO PAST SURGERIES    . NO PAST SURGERIES      Family history: Family History  Problem Relation Age of Onset  . Arthritis Mother        Knee  . Allergic rhinitis Mother   . Asthma Mother   . Allergic rhinitis Father   . Asthma Father   . Angioedema Neg Hx   . Atopy Neg Hx   . Eczema Neg Hx   . Urticaria Neg Hx   . Immunodeficiency Neg Hx     Social history: Social History   Socioeconomic History  . Marital status: Single    Spouse name: Not on file  . Number of children: 0  . Years of education: 66  . Highest education level: Not on file  Occupational History  . Not on file  Tobacco Use  . Smoking status: Never Smoker  . Smokeless tobacco: Never Used  Vaping Use  . Vaping Use: Never used  Substance and Sexual Activity  . Alcohol use: Yes    Comment: Occasion  . Drug use: No  . Sexual  activity: Not on file    Comment: declined condoms 09/24/2018  Other Topics Concern  . Not on file  Social History Narrative  . Not on file   Social Determinants of Health   Financial Resource Strain:   . Difficulty of Paying Living Expenses: Not on file  Food Insecurity:   . Worried About 11/23/2018 in the Last Year: Not on file  . Ran Out of Food in the Last Year: Not on file  Transportation Needs:   . Lack of Transportation (Medical): Not on file  . Lack of Transportation (Non-Medical): Not on file  Physical Activity:   . Days of Exercise per Week: Not on file  . Minutes of Exercise per Session: Not on file  Stress:   . Feeling of Stress : Not on file  Social Connections:   . Frequency of Communication with Friends and Family: Not on file  . Frequency of Social Gatherings with Friends and Family: Not on file  . Attends Religious Services: Not on file  . Active Member of Clubs or Organizations: Not on file  . Attends Programme researcher, broadcasting/film/video Meetings: Not on file  . Marital Status: Not on file  Intimate Partner Violence:   . Fear of  Current or Ex-Partner: Not on file  . Emotionally Abused: Not on file  . Physically Abused: Not on file  . Sexually Abused: Not on file    Environmental History: Patient lives in a house with carpeting throughout and central heat and window air conditioning units.  There is no known mold/water damage in the home.  There are dogs in the home which have access to his bedroom.  He is a non-smoker.  Current Outpatient Medications  Medication Sig Dispense Refill  . albuterol (PROVENTIL HFA;VENTOLIN HFA) 108 (90 Base) MCG/ACT inhaler Inhale 2 puffs into the lungs every 6 (six) hours as needed for wheezing or shortness of breath. 2 puffs 3 times daily x5 days then every 6 hours as needed. 1 Inhaler 0  . bictegravir-emtricitabine-tenofovir AF (BIKTARVY) 50-200-25 MG TABS tablet Take 1 tablet by mouth daily. 30 tablet 5  . levocetirizine (XYZAL) 5 MG  tablet Take 5 mg by mouth every evening.    . Multiple Vitamins-Minerals (MULTIVITAMIN ADULT) CHEW Chew 1 tablet by mouth 2 (two) times daily.    . Azelastine-Fluticasone (DYMISTA) 137-50 MCG/ACT SUSP Place 1 spray into both nostrils 2 (two) times daily as needed. 23 g 5  . EPINEPHrine (AUVI-Q) 0.3 mg/0.3 mL IJ SOAJ injection Inject 0.3 mg into the muscle as needed for anaphylaxis. 1 each 1  . Olopatadine HCl (PATADAY) 0.2 % SOLN Place 1 drop into both eyes daily as needed. 2.5 mL 5   No current facility-administered medications for this visit.    Known medication allergies: Allergies  Allergen Reactions  . Amoxicillin Rash    Also has mild lip and facial swelling  . Shellfish Allergy Rash    Also had mild lip and facial swelling    I appreciate the opportunity to take part in Edrick's care. Please do not hesitate to contact me with questions.  Sincerely,   R. Jorene Guest, MD

## 2020-08-31 NOTE — Assessment & Plan Note (Signed)
   Aeroallergen avoidance measures have been discussed and provided in written form.  To avoid diminishing benefit with daily use (tachyphylaxis) of second generation antihistamine, consider alternating every few months between fexofenadine (Allegra) and levocetirizine (Xyzal).  A prescription has been provided for azelastine/fluticasone nasal spray, 1 spray per nostril twice daily as needed. Proper nasal spray technique has been discussed and demonstrated.  Nasal saline spray (i.e., Simply Saline) or nasal saline lavage (i.e., NeilMed) is recommended as needed and prior to medicated nasal sprays.  If allergen avoidance measures and medications fail to adequately relieve symptoms, aeroallergen immunotherapy will be considered.

## 2020-08-31 NOTE — Assessment & Plan Note (Signed)
The patient's history suggests food allergy and positive skin test results today confirm this diagnosis.  Meticulous avoidance of shellfish and fish as discussed.  A prescription has been provided for epinephrine auto-injector (Auvi-Q) 2 pack along with instructions for proper administration.  A food allergy action plan has been provided and discussed.  Medic Alert identification is recommended.

## 2020-08-31 NOTE — Assessment & Plan Note (Signed)
   Treatment plan as outlined above for allergic rhinitis.  A prescription has been provided for generic Pataday, one drop per eye daily as needed.  If insurance does not cover this medication, medicated allergy eyedrops may be purchased over-the-counter as Pataday Extra Strength or Zaditor.  I have also recommended eye lubricant drops (i.e., Natural Tears) as needed. 

## 2020-08-31 NOTE — Patient Instructions (Addendum)
Perennial and seasonal allergic rhinitis  Aeroallergen avoidance measures have been discussed and provided in written form.  To avoid diminishing benefit with daily use (tachyphylaxis) of second generation antihistamine, consider alternating every few months between fexofenadine (Allegra) and levocetirizine (Xyzal).  A prescription has been provided for azelastine/fluticasone nasal spray, 1 spray per nostril twice daily as needed. Proper nasal spray technique has been discussed and demonstrated.  Nasal saline spray (i.e., Simply Saline) or nasal saline lavage (i.e., NeilMed) is recommended as needed and prior to medicated nasal sprays.  If allergen avoidance measures and medications fail to adequately relieve symptoms, aeroallergen immunotherapy will be considered.  Allergic conjunctivitis  Treatment plan as outlined above for allergic rhinitis.  A prescription has been provided for generic Pataday, one drop per eye daily as needed.  If insurance does not cover this medication, medicated allergy eyedrops may be purchased over-the-counter as Art therapist.  I have also recommended eye lubricant drops (i.e., Natural Tears) as needed.  Food allergy The patient's history suggests food allergy and positive skin test results today confirm this diagnosis.  Meticulous avoidance of shellfish and fish as discussed.  A prescription has been provided for epinephrine auto-injector (Auvi-Q) 2 pack along with instructions for proper administration.  A food allergy action plan has been provided and discussed.  Medic Alert identification is recommended.   Return in about 3 months (around 11/29/2020), or if symptoms worsen or fail to improve.  Control of Dust Mite Allergen  House dust mites play a major role in allergic asthma and rhinitis.  They occur in environments with high humidity wherever human skin, the food for dust mites is found. High levels have been detected in dust  obtained from mattresses, pillows, carpets, upholstered furniture, bed covers, clothes and soft toys.  The principal allergen of the house dust mite is found in its feces.  A gram of dust may contain 1,000 mites and 250,000 fecal particles.  Mite antigen is easily measured in the air during house cleaning activities.    1. Encase mattresses, including the box spring, and pillow, in an air tight cover.  Seal the zipper end of the encased mattresses with wide adhesive tape. 2. Wash the bedding in water of 130 degrees Farenheit weekly.  Avoid cotton comforters/quilts and flannel bedding: the most ideal bed covering is the dacron comforter. 3. Remove all upholstered furniture from the bedroom. 4. Remove carpets, carpet padding, rugs, and non-washable window drapes from the bedroom.  Wash drapes weekly or use plastic window coverings. 5. Remove all non-washable stuffed toys from the bedroom.  Wash stuffed toys weekly. 6. Have the room cleaned frequently with a vacuum cleaner and a damp dust-mop.  The patient should not be in a room which is being cleaned and should wait 1 hour after cleaning before going into the room. 7. Close and seal all heating outlets in the bedroom.  Otherwise, the room will become filled with dust-laden air.  An electric heater can be used to heat the room. Reduce indoor humidity to less than 50%.  Do not use a humidifier.   Reducing Pollen Exposure  The American Academy of Allergy, Asthma and Immunology suggests the following steps to reduce your exposure to pollen during allergy seasons.    1. Do not hang sheets or clothing out to dry; pollen may collect on these items. 2. Do not mow lawns or spend time around freshly cut grass; mowing stirs up pollen. 3. Keep windows closed at night.  Keep car windows closed while driving. 4. Minimize morning activities outdoors, a time when pollen counts are usually at their highest. 5. Stay indoors as much as possible when pollen counts or  humidity is high and on windy days when pollen tends to remain in the air longer. 6. Use air conditioning when possible.  Many air conditioners have filters that trap the pollen spores. 7. Use a HEPA room air filter to remove pollen form the indoor air you breathe.   Control of Dog or Cat Allergen  Avoidance is the best way to manage a dog or cat allergy. If you have a dog or cat and are allergic to dog or cats, consider removing the dog or cat from the home. If you have a dog or cat but don't want to find it a new home, or if your family wants a pet even though someone in the household is allergic, here are some strategies that may help keep symptoms at bay:  1. Keep the pet out of your bedroom and restrict it to only a few rooms. Be advised that keeping the dog or cat in only one room will not limit the allergens to that room. 2. Don't pet, hug or kiss the dog or cat; if you do, wash your hands with soap and water. 3. High-efficiency particulate air (HEPA) cleaners run continuously in a bedroom or living room can reduce allergen levels over time. 4. Place electrostatic material sheet in the air inlet vent in the bedroom. 5. Regular use of a high-efficiency vacuum cleaner or a central vacuum can reduce allergen levels. 6. Giving your dog or cat a bath at least once a week can reduce airborne allergen.   Control of Mold Allergen  Mold and fungi can grow on a variety of surfaces provided certain temperature and moisture conditions exist.  Outdoor molds grow on plants, decaying vegetation and soil.  The major outdoor mold, Alternaria and Cladosporium, are found in very high numbers during hot and dry conditions.  Generally, a late Summer - Fall peak is seen for common outdoor fungal spores.  Rain will temporarily lower outdoor mold spore count, but counts rise rapidly when the rainy period ends.  The most important indoor molds are Aspergillus and Penicillium.  Dark, humid and poorly ventilated  basements are ideal sites for mold growth.  The next most common sites of mold growth are the bathroom and the kitchen.  Outdoor [employer] 1. Use air conditioning and keep windows closed 2. Avoid exposure to decaying vegetation. 3. Avoid leaf raking. 4. Avoid grain handling. 5. Consider wearing a face mask if working in moldy areas.  Indoor Mold Control 1. Maintain humidity below 50%. 2. Clean washable surfaces with 5% bleach solution. 3. Remove sources e.g. Contaminated carpets.   Control of Cockroach Allergen  Cockroach allergen has been identified as an important cause of acute attacks of asthma, especially in urban settings.  There are fifty-five species of cockroach that exist in the [place], however only three, the [place], [place] species produce allergen that can affect patients with Asthma.  Allergens can be obtained from fecal particles, egg casings and secretions from cockroaches.    1. Remove food sources. 2. Reduce access to water. 3. Seal access and entry points. 4. Spray runways with 0.5-1% Diazinon or Chlorpyrifos 5. Blow boric acid power under stoves and refrigerator. 6. Place bait stations (hydramethylnon) at feeding sites.

## 2020-09-01 ENCOUNTER — Other Ambulatory Visit: Payer: Self-pay

## 2020-09-01 MED ORDER — FLUTICASONE PROPIONATE 50 MCG/ACT NA SUSP: 2.0000 | Freq: Every day | NASAL | 5 refills | Status: AC | PRN

## 2020-09-01 MED ORDER — AZELASTINE HCL 0.1 % NA SOLN: 2.0000 | Freq: Two times a day (BID) | NASAL | 5 refills | Status: AC | PRN

## 2020-09-22 MED FILL — BIKTARVY 50-200-25 MG TABS: 50-200-25 | 30 days supply | Qty: 30 | Fill #2

## 2020-10-28 MED FILL — BIKTARVY 50-200-25 MG TABS: 50-200-25 | 30 days supply | Qty: 30 | Fill #3

## 2020-11-07 ENCOUNTER — Other Ambulatory Visit: Payer: Self-pay

## 2020-11-07 ENCOUNTER — Other Ambulatory Visit: Payer: 59

## 2020-11-07 DIAGNOSIS — B20 Human immunodeficiency virus [HIV] disease: Secondary | ICD-10-CM

## 2020-11-07 DIAGNOSIS — Z79899 Other long term (current) drug therapy: Secondary | ICD-10-CM

## 2020-11-07 DIAGNOSIS — Z113 Encounter for screening for infections with a predominantly sexual mode of transmission: Secondary | ICD-10-CM

## 2020-11-07 NOTE — Progress Notes (Unsigned)
hv

## 2020-11-08 LAB — T-HELPER CELL (CD4) - (RCID CLINIC ONLY)
CD4 % Helper T Cell: 22 % — ABNORMAL LOW (ref 33–65)
CD4 T Cell Abs: 364 /uL — ABNORMAL LOW (ref 400–1790)

## 2020-11-10 LAB — HIV-1 RNA QUANT-NO REFLEX-BLD
HIV 1 RNA Quant: <20 Copies/mL
HIV-1 RNA Quant, Log: <1.3 Log cps/mL

## 2020-11-22 ENCOUNTER — Ambulatory Visit: Payer: 59 | Admitting: Family

## 2020-11-22 ENCOUNTER — Other Ambulatory Visit: Payer: Self-pay

## 2020-11-22 ENCOUNTER — Encounter: Payer: Self-pay | Admitting: Family

## 2020-11-22 ENCOUNTER — Other Ambulatory Visit: Payer: Self-pay | Admitting: Family

## 2020-11-22 VITALS — BP 133/85 | HR 83 | Temp 97.8°F | Ht 70.0 in | Wt 283.0 lb

## 2020-11-22 DIAGNOSIS — Z0000 Encounter for general adult medical examination without abnormal findings: Secondary | ICD-10-CM

## 2020-11-22 DIAGNOSIS — B20 Human immunodeficiency virus [HIV] disease: Secondary | ICD-10-CM

## 2020-11-22 DIAGNOSIS — Z79899 Other long term (current) drug therapy: Secondary | ICD-10-CM | POA: Diagnosis not present

## 2020-11-22 DIAGNOSIS — A539 Syphilis, unspecified: Secondary | ICD-10-CM | POA: Diagnosis not present

## 2020-11-22 MED ORDER — BIKTARVY 50-200-25 MG PO TABS
1.0000 | ORAL_TABLET | Freq: Every day | ORAL | 5 refills | Status: DC
Start: 2020-11-22 — End: 2020-11-22

## 2020-11-22 NOTE — Patient Instructions (Addendum)
Nice to see you.  Continue to take your [place] daily as prescribed.  Refills have been sent to the pharmacy.  Recommend routine dental care when able.  Have a Happy [place] Ouch and great trip to [place]!  Plan for follow up in 4 months or sooner if needed with lab work 1-2 weeks prior to appointment.  Have a great day and stay safe!

## 2020-11-22 NOTE — Progress Notes (Signed)
Subjective:    Patient ID: Charles Morrison, male    DOB: 26-Apr-1981, 40 y.o.   MRN: [id number]  Chief Complaint  Patient presents with  . Follow-up    Declined condoms      HPI:  Charles Morrison is a 40 y.o. male with HIV disease last seen on 07/26/2020 with well-controlled HIV and good adherence and tolerance to his ART regimen of Biktarvy.  Lab work at the time showed a viral load that was undetectable and CD4 count of 348.  RPR titer positive for syphilis 1:128.  Most recent blood work completed on 11/07/2020 with viral load remains undetectable and CD4 count of 364.  Here today for routine follow-up.  Charles Morrison continues to take his Susanne Borders daily as prescribed with no adverse side effects or missed doses since his last office visit overall feeling well today with no new concerns/complaints.  Planning on a trip to [place] for his birthday this weekend. Denies fevers, chills, night sweats, headaches, changes in vision, neck pain/stiffness, nausea, diarrhea, vomiting, lesions or rashes.  Charles Morrison has no problems obtaining medication from the pharmacy remains covered [place] healthcare.  Denies feelings of being down, depressed, or hopeless recently.  No recreational or illicit drug use, tobacco use, and occasional alcohol consumption.  Declines condoms.  Due for routine dental care.  Healthcare maintenance due includes tetanus booster.  Allergies  Allergen Reactions  . Amoxicillin Rash    Also has mild lip and facial swelling  . Shellfish Allergy Rash    Also had mild lip and facial swelling      Outpatient Medications Prior to Visit  Medication Sig Dispense Refill  . albuterol (PROVENTIL HFA;VENTOLIN HFA) 108 (90 Base) MCG/ACT inhaler Inhale 2 puffs into the lungs every 6 (six) hours as needed for wheezing or shortness of breath. 2 puffs 3 times daily x5 days then every 6 hours as needed. 1 Inhaler 0  . azelastine (ASTELIN) 0.1 % nasal spray Place 2 sprays into both nostrils 2  (two) times daily as needed for rhinitis. Use in each nostril as directed 30 mL 5  . Azelastine-Fluticasone (DYMISTA) 137-50 MCG/ACT SUSP Place 1 spray into both nostrils 2 (two) times daily as needed. 23 g 5  . EPINEPHrine (AUVI-Q) 0.3 mg/0.3 mL IJ SOAJ injection Inject 0.3 mg into the muscle as needed for anaphylaxis. 1 each 1  . fluticasone (FLONASE) 50 MCG/ACT nasal spray Place 2 sprays into both nostrils daily as needed for allergies or rhinitis. 16 g 5  . levocetirizine (XYZAL) 5 MG tablet Take 5 mg by mouth every evening.    . Multiple Vitamins-Minerals (MULTIVITAMIN ADULT) CHEW Chew 1 tablet by mouth 2 (two) times daily.    . Olopatadine HCl (PATADAY) 0.2 % SOLN Place 1 drop into both eyes daily as needed. 2.5 mL 5  . bictegravir-emtricitabine-tenofovir AF (BIKTARVY) 50-200-25 MG TABS tablet Take 1 tablet by mouth daily. 30 tablet 5   No facility-administered medications prior to visit.     Past Medical History:  Diagnosis Date  . HIV infection (HCC)   . Medical history non-contributory      Past Surgical History:  Procedure Laterality Date  . NO PAST SURGERIES    . NO PAST SURGERIES      Review of Systems  Constitutional: Negative for appetite change, chills, fatigue, fever and unexpected weight change.  Eyes: Negative for visual disturbance.  Respiratory: Negative for cough, chest tightness, shortness of breath and wheezing.   Cardiovascular: Negative for chest  pain and leg swelling.  Gastrointestinal: Negative for abdominal pain, constipation, diarrhea, nausea and vomiting.  Genitourinary: Negative for dysuria, flank pain, frequency, genital sores, hematuria and urgency.  Skin: Negative for rash.  Allergic/Immunologic: Negative for immunocompromised state.  Neurological: Negative for dizziness and headaches.      Objective:    BP 133/85   Pulse 83   Temp 97.8 F (36.6 C) (Oral)   Ht 5\' 10"  (1.778 m)   Wt 283 lb (128.4 kg)   SpO2 98%   BMI 40.61 kg/m   Nursing note and vital signs reviewed.  Physical Exam Constitutional:      General: He is not in acute distress.    Appearance: He is well-developed.  HENT:     Mouth/Throat:     Mouth: Oropharynx is clear and moist.  Eyes:     Conjunctiva/sclera: Conjunctivae normal.  Cardiovascular:     Rate and Rhythm: Normal rate and regular rhythm.     Pulses: Intact distal pulses.     Heart sounds: Normal heart sounds. No murmur heard. No friction rub. No gallop.   Pulmonary:     Effort: Pulmonary effort is normal. No respiratory distress.     Breath sounds: Normal breath sounds. No wheezing or rales.  Chest:     Chest wall: No tenderness.  Abdominal:     General: Bowel sounds are normal.     Palpations: Abdomen is soft.     Tenderness: There is no abdominal tenderness.  Musculoskeletal:     Cervical back: Neck supple.  Lymphadenopathy:     Cervical: No cervical adenopathy.  Skin:    General: Skin is warm and dry.     Findings: No rash.  Neurological:     Mental Status: He is alert and oriented to person, place, and time.  Psychiatric:        Mood and Affect: Mood and affect normal.        Behavior: Behavior normal.        Thought Content: Thought content normal.        Judgment: Judgment normal.      Depression screen [facility] 2/9 11/22/2020 07/26/2020 09/21/2019 05/04/2019 09/18/2018  Decreased Interest 0 0 0 0 0  Down, Depressed, Hopeless 0 0 0 0 0  PHQ - 2 Score 0 0 0 0 0       Assessment & Plan:    Patient Active Problem List   Diagnosis Date Noted  . Perennial and seasonal allergic rhinitis 08/31/2020  . Allergic conjunctivitis 08/31/2020  . Food allergy 08/31/2020  . Syphilis 07/26/2020  . Healthcare maintenance 09/18/2018  . HIV disease (HCC) 05/30/2018  . Hypoxemia   . 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 Anemia 05/14/2018  . Acute hypoxemic respiratory failure (HCC) 05/14/2018  . Allergy to amoxicillin 12/13/2017  . Pre-syncope 12/13/2017  . AKI (acute kidney  injury) (HCC) 12/13/2017  . Hypokalemia 12/13/2017  . Allergic reaction 12/13/2017     Problem List Items Addressed This Visit      Other   HIV disease [facility])    Charles Morrison continues to have well-controlled HIV disease with good adherence and tolerance to his ART regimen of Biktarvy.  No signs/symptoms of opportunistic infection or progressive HIV disease.  We reviewed lab work and discussed plan of care.  Continue current dose of Biktarvy.  Plan for follow-up in 4 months or sooner if needed with lab work 1 to 2 weeks prior to appointment.  Relevant Medications   bictegravir-emtricitabine-tenofovir AF (BIKTARVY) 50-200-25 MG TABS tablet   Other Relevant Orders   HIV-1 RNA quant-no reflex-bld   T-helper cell (CD4)- ([facility] clinic only)   Comprehensive metabolic panel   Healthcare maintenance     Discussed importance of safe sexual practice to reduce risk of STI.  Condoms declined.  Due for routine dental care and has commercial insurance that he will schedule independently.  Due for tetanus at next office visit.      Syphilis - Primary    Charles Morrison previously treated with doxycycline.  No current symptoms.  Recheck RPR today.  Discussed importance of safe sexual practice to reduce risk of STI.  Continue to monitor RPR.      Relevant Medications   bictegravir-emtricitabine-tenofovir AF (BIKTARVY) 50-200-25 MG TABS tablet   Other Relevant Orders   RPR   RPR    Other Visit Diagnoses    Pharmacologic therapy       Relevant Orders   Lipid panel       I am having [name] maintain his Multivitamin Adult, albuterol, levocetirizine, EPINEPHrine, Azelastine-Fluticasone, Olopatadine HCl, fluticasone, azelastine, and Biktarvy.   Meds ordered this encounter  Medications  . bictegravir-emtricitabine-tenofovir AF (BIKTARVY) 50-200-25 MG TABS tablet    Sig: Take 1 tablet by mouth daily.    Dispense:  30 tablet    Refill:  5    Order Specific Question:   Supervising  Provider    Answer:   Judyann Munson [[id number]]     Follow-up: Return in about 4 months (around 03/24/2021), or if symptoms worsen or fail to improve.   Marcos Eke, MSN, FNP-C Nurse Practitioner [facility] Main number: [phone]

## 2020-11-22 NOTE — Assessment & Plan Note (Signed)
   Discussed importance of safe sexual practice to reduce risk of STI.  Condoms declined.  Due for routine dental care and has commercial insurance that he will schedule independently.  Due for tetanus at next office visit.

## 2020-11-22 NOTE — Assessment & Plan Note (Signed)
Charles Morrison continues to have well-controlled HIV disease with good adherence and tolerance to his ART regimen of Biktarvy.  No signs/symptoms of opportunistic infection or progressive HIV disease.  We reviewed lab work and discussed plan of care.  Continue current dose of Biktarvy.  Plan for follow-up in 4 months or sooner if needed with lab work 1 to 2 weeks prior to appointment.

## 2020-11-22 NOTE — Assessment & Plan Note (Signed)
Mr. Dains previously treated with doxycycline.  No current symptoms.  Recheck RPR today.  Discussed importance of safe sexual practice to reduce risk of STI.  Continue to monitor RPR.

## 2020-11-24 LAB — RPR TITER: RPR Titer: 1:4 {titer} — ABNORMAL HIGH

## 2020-11-24 LAB — RPR: RPR Ser Ql: REACTIVE — AB

## 2020-11-24 LAB — FLUORESCENT TREPONEMAL AB(FTA)-IGG-BLD: Fluorescent Treponemal ABS: REACTIVE — AB

## 2020-11-28 NOTE — Progress Notes (Deleted)
   [address] [place] [address] Dept: [phone]  FOLLOW UP NOTE  Patient ID: Charles Morrison, male    DOB: 1981/05/31  Age: 40 y.o. MRN: [id number] Date of Office Visit: 11/29/2020  Assessment  Chief Complaint: No chief complaint on file.  HPI Mareon Robinette    Drug Allergies:  Allergies  Allergen Reactions  . Amoxicillin Rash    Also has mild lip and facial swelling  . Shellfish Allergy Rash    Also had mild lip and facial swelling    Physical Exam: There were no vitals taken for this visit.   Physical Exam  Diagnostics:    Assessment and Plan: No diagnosis found.  No orders of the defined types were placed in this encounter.   There are no Patient Instructions on file for this visit.  No follow-ups on file.    Thank you for the opportunity to care for this patient.  Please do not hesitate to contact me with questions.  Thermon Leyland, FNP [facility]

## 2020-11-28 NOTE — Patient Instructions (Incomplete)
Allergic rhinitis Continue allergen avoidance measures directed toward pollens, dust mite, cat hair, dog, and cockroach. Continue an over-the-counter antihistamine.Remember to rotate to a different antihistamine about every 3 months. Some examples of over the counter antihistamines include Zyrtec (cetirizine), Xyzal (levocetirizine), Allegra (fexofenadine), and Claritin (loratidine).  Continue azelastine/fluticasone 1 to 2 sprays in each nostril twice a day as needed for nasal symptoms Consider saline nasal rinses as needed for nasal symptoms. Use this before any medicated nasal sprays for best result  Allergic conjunctivitis Consider saline nasal rinses as needed for nasal symptoms. Use this before any medicated nasal sprays for best result  Food allergy Continue to avoid fish and shellfish. In case of an allergic reaction, take Benadryl 50 mg every 4 hours, and if life-threatening symptoms occur, inject with AuviQ 0.3 mg.  Call the clinic if this treatment plan is not working well for you  Follow up in *** or sooner if needed.

## 2020-11-29 ENCOUNTER — Ambulatory Visit: Payer: 59 | Admitting: Family Medicine

## 2020-11-29 DIAGNOSIS — J309 Allergic rhinitis, unspecified: Secondary | ICD-10-CM

## 2020-11-30 MED FILL — BIKTARVY 50-200-25 MG TABS: 50-200-25 | 30 days supply | Qty: 30 | Fill #4

## 2020-12-20 ENCOUNTER — Other Ambulatory Visit (HOSPITAL_COMMUNITY): Payer: Self-pay

## 2020-12-26 ENCOUNTER — Other Ambulatory Visit (HOSPITAL_COMMUNITY): Payer: Self-pay

## 2020-12-26 MED FILL — Bictegravir-Emtricitabine-Tenofovir AF Tab 50-200-25 MG: ORAL | 30 days supply | Qty: 30 | Fill #0 | Status: AC

## 2020-12-27 ENCOUNTER — Other Ambulatory Visit (HOSPITAL_COMMUNITY): Payer: Self-pay

## 2020-12-28 ENCOUNTER — Other Ambulatory Visit (HOSPITAL_COMMUNITY): Payer: Self-pay

## 2020-12-29 ENCOUNTER — Other Ambulatory Visit (HOSPITAL_COMMUNITY): Payer: Self-pay

## 2021-01-19 ENCOUNTER — Other Ambulatory Visit (HOSPITAL_COMMUNITY): Payer: Self-pay

## 2021-01-19 MED FILL — Bictegravir-Emtricitabine-Tenofovir AF Tab 50-200-25 MG: ORAL | 30 days supply | Qty: 30 | Fill #1 | Status: AC

## 2021-01-25 ENCOUNTER — Other Ambulatory Visit (HOSPITAL_COMMUNITY): Payer: Self-pay

## 2021-02-22 ENCOUNTER — Other Ambulatory Visit (HOSPITAL_COMMUNITY): Payer: Self-pay

## 2021-02-24 ENCOUNTER — Other Ambulatory Visit: Payer: Self-pay

## 2021-02-24 ENCOUNTER — Encounter: Payer: Self-pay | Admitting: Family Medicine

## 2021-02-24 ENCOUNTER — Ambulatory Visit: Payer: 59 | Admitting: Family Medicine

## 2021-02-24 VITALS — BP 100/70 | HR 74 | Temp 97.7°F | Resp 16

## 2021-02-24 DIAGNOSIS — J3089 Other allergic rhinitis: Secondary | ICD-10-CM | POA: Diagnosis not present

## 2021-02-24 DIAGNOSIS — H1013 Acute atopic conjunctivitis, bilateral: Secondary | ICD-10-CM | POA: Diagnosis not present

## 2021-02-24 DIAGNOSIS — T7800XA Anaphylactic reaction due to unspecified food, initial encounter: Secondary | ICD-10-CM

## 2021-02-24 MED ORDER — EPINEPHRINE 0.3 MG/0.3ML IJ SOAJ: 0.3000 mg | INTRAMUSCULAR | 1 refills | Status: AC | PRN

## 2021-02-24 NOTE — Patient Instructions (Addendum)
Allergic Rhinitis Continue allergen avoidance measures directed toward pollen, mold, dust mite, dog, cat, cockroach as listed below Begin cetirizine 10 mg once a day as needed for runny nose or itch. This will replace Xyzal. Remember to rotate to a different antihistamine about every 3 months. Some examples of over the counter antihistamines include Zyrtec (cetirizine), Xyzal (levocetirizine), Allegra (fexofenadine), and Claritin (loratidine).  Continue Flonase 2 sprays each nostril once a day as needed for stuffy nose. In the right nostril, point the applicator out toward the right ear. In the left nostril, point the applicator out toward the left ear Continue azelastine 2 sprays in each nostril twice a day as needed for runny nose Consider saline nasal rinses as needed for nasal symptoms. Use this before any medicated nasal sprays for best result Consider course of allergen immunotherapy if your symptoms are not controlled by the treatment plan as listed above  Allergic conjunctivitis Continue Pataday eyedrops 1 drop in each eye once a day as needed for red or itchy eyes  Food allergy Continue to avoid fish and shellfish. In case of an allergic reaction, take Benadryl 50 mg  every 4 hours, and if life-threatening symptoms occur, inject with AuviQ 0.3 mg.  Call the clinic if this treatment plan is not working well for you.  Follow up in 1 year or sooner if needed.  Reducing Pollen Exposure The American Academy of Allergy, Asthma and Immunology suggests the following steps to reduce your exposure to pollen during allergy seasons. 1. Do not hang sheets or clothing out to dry; pollen may collect on these items. 2. Do not mow lawns or spend time around freshly cut grass; mowing stirs up pollen. 3. Keep windows closed at night.  Keep car windows closed while driving. 4. Minimize morning activities outdoors, a time when pollen counts are usually at their highest. 5. Stay indoors as much as  possible when pollen counts or humidity is high and on windy days when pollen tends to remain in the air longer. 6. Use air conditioning when possible.  Many air conditioners have filters that trap the pollen spores. 7. Use a HEPA room air filter to remove pollen form the indoor air you breathe.  Control of Mold Allergen Mold and fungi can grow on a variety of surfaces provided certain temperature and moisture conditions exist.  Outdoor molds grow on plants, decaying vegetation and soil.  The major outdoor mold, Alternaria and Cladosporium, are found in very high numbers during hot and dry conditions.  Generally, a late Summer - Fall peak is seen for common outdoor fungal spores.  Rain will temporarily lower outdoor mold spore count, but counts rise rapidly when the rainy period ends.  The most important indoor molds are Aspergillus and Penicillium.  Dark, humid and poorly ventilated basements are ideal sites for mold growth.  The next most common sites of mold growth are the bathroom and the kitchen.  Outdoor [employer] 8. Use air conditioning and keep windows closed 9. Avoid exposure to decaying vegetation. 10. Avoid leaf raking. 11. Avoid grain handling. 12. Consider wearing a face mask if working in moldy areas.  Indoor Mold Control 1. Maintain humidity below 50%. 2. Clean washable surfaces with 5% bleach solution. 3. Remove sources e.g. Contaminated carpets.   Control of Dust Mite Allergen Dust mites play a major role in allergic asthma and rhinitis. They occur in environments with high humidity wherever human skin is found. Dust mites absorb humidity from the atmosphere (ie, they  do not drink) and feed on organic matter (including shed human and animal skin). Dust mites are a microscopic type of insect that you cannot see with the naked eye. High levels of dust mites have been detected from mattresses, pillows, carpets, upholstered furniture, bed covers, clothes, soft toys and any woven  material. The principal allergen of the dust mite is found in its feces. A gram of dust may contain 1,000 mites and 250,000 fecal particles. Mite antigen is easily measured in the air during house cleaning activities. Dust mites do not bite and do not cause harm to humans, other than by triggering allergies/asthma.  Ways to decrease your exposure to dust mites in your home:  1. Encase mattresses, box springs and pillows with a mite-impermeable barrier or cover  2. Wash sheets, blankets and drapes weekly in hot water (130 F) with detergent and dry them in a dryer on the hot setting.  3. Have the room cleaned frequently with a vacuum cleaner and a damp dust-mop. For carpeting or rugs, vacuuming with a vacuum cleaner equipped with a high-efficiency particulate air (HEPA) filter. The dust mite allergic individual should not be in a room which is being cleaned and should wait 1 hour after cleaning before going into the room.  4. Do not sleep on upholstered furniture (eg, couches).  5. If possible removing carpeting, upholstered furniture and drapery from the home is ideal. Horizontal blinds should be eliminated in the rooms where the person spends the most time (bedroom, study, television room). Washable vinyl, roller-type shades are optimal.  6. Remove all non-washable stuffed toys from the bedroom. Wash stuffed toys weekly like sheets and blankets above.  7. Reduce indoor humidity to less than 50%. Inexpensive humidity monitors can be purchased at most hardware stores. Do not use a humidifier as can make the problem worse and are not recommended.  Control of Dog or Cat Allergen Avoidance is the best way to manage a dog or cat allergy. If you have a dog or cat and are allergic to dog or cats, consider removing the dog or cat from the home. If you have a dog or cat but don't want to find it a new home, or if your family wants a pet even though someone in the household is allergic, here are some  strategies that may help keep symptoms at bay:  13. Keep the pet out of your bedroom and restrict it to only a few rooms. Be advised that keeping the dog or cat in only one room will not limit the allergens to that room. 14. Don't pet, hug or kiss the dog or cat; if you do, wash your hands with soap and water. 15. High-efficiency particulate air (HEPA) cleaners run continuously in a bedroom or living room can reduce allergen levels over time. 16. Regular use of a high-efficiency vacuum cleaner or a central vacuum can reduce allergen levels. 17. Giving your dog or cat a bath at least once a week can reduce airborne allergen.  Control of Cockroach Allergen Cockroach allergen has been identified as an important cause of acute attacks of asthma, especially in urban settings.  There are fifty-five species of cockroach that exist in the [place], however only three, the [place], [place] species produce allergen that can affect patients with Asthma.  Allergens can be obtained from fecal particles, egg casings and secretions from cockroaches.    1. Remove food sources. 2. Reduce access to water. 3. Seal access and entry points.  4. Spray runways with 0.5-1% Diazinon or Chlorpyrifos 5. Blow boric acid power under stoves and refrigerator. 6. Place bait stations (hydramethylnon) at feeding sites.

## 2021-02-24 NOTE — Progress Notes (Addendum)
[address] [place] [address] Dept: [phone]  FOLLOW UP NOTE  Patient ID: Charles Morrison, male    DOB: 1981-01-08  Age: 40 y.o. MRN: [id number] Date of Office Visit: 02/24/2021  Assessment  Chief Complaint: Allergic Rhinitis  (Itchy eyes, and stuffy nose. Xyzal isn't working for his allergies.)  HPI Charles Morrison is a 40 year old male who presents to the clinic for follow-up visit.  He was last seen in this clinic on 08/25/2020 by Dr. Nunzio Cobbs for evaluation of allergic rhinitis, allergic conjunctivitis, and food allergy to fish and shellfish.  At today's visit, he reports allergic rhinitis has been poorly controlled with symptoms including nasal congestion and copious postnasal drainage with frequent throat clearing.  He continues Xyzal 5 mg once a day and occasionally uses Flonase.  He reports excellent application technique. He is not currently using azelastine or nasal saline rinse.  He has previously tried Claritin, cetirizine, and Xyzal, each initially effective with fading efficacy after several months.  Allergic conjunctivitis is reported as poorly controlled with red and itchy eyes for which he is not using any medical intervention.  He continues to avoid fish and shellfish with no accidental ingestion or epinephrine auto-injector use.  His current medications are listed in the chart.   Drug Allergies:  Allergies  Allergen Reactions   Amoxicillin Rash    Also has mild lip and facial swelling   Shellfish Allergy Rash    AVOIDS ALL SHELLFISH AND FISH Also had mild lip and facial swelling    Physical Exam: BP 100/70   Pulse 74   Temp 97.7 F (36.5 C) (Temporal)   Resp 16   SpO2 98%    Physical Exam Vitals reviewed.  Constitutional:      Appearance: Normal appearance.  HENT:     Head: Normocephalic and atraumatic.     Right Ear: Tympanic membrane normal.     Left Ear: Tympanic membrane normal.     Nose:     Comments: Bilateral nares slightly  erythematous with clear nasal drainage noted.  Pharynx normal.  Ears normal.  Eyes normal.    Mouth/Throat:     Pharynx: Oropharynx is clear.  Eyes:     Conjunctiva/sclera: Conjunctivae normal.  Cardiovascular:     Rate and Rhythm: Normal rate and regular rhythm.     Heart sounds: Normal heart sounds. No murmur heard.   Pulmonary:     Effort: Pulmonary effort is normal.     Breath sounds: Normal breath sounds.     Comments: Lungs clear to auscultation Musculoskeletal:        General: Normal range of motion.     Cervical back: Normal range of motion and neck supple.  Skin:    General: Skin is warm and dry.  Neurological:     Mental Status: He is alert and oriented to person, place, and time.  Psychiatric:        Mood and Affect: Mood normal.        Behavior: Behavior normal.        Thought Content: Thought content normal.        Judgment: Judgment normal.    Assessment and Plan: 1. Perennial and seasonal allergic rhinitis   2. Allergic conjunctivitis of both eyes   3. Food allergy     Meds ordered this encounter  Medications   EPINEPHrine (AUVI-Q) 0.3 mg/0.3 mL IJ SOAJ injection    Sig: Inject 0.3 mg into the muscle as needed for anaphylaxis.  Dispense:  1 each    Refill:  1    Patient Instructions  Allergic Rhinitis Continue allergen avoidance measures directed toward pollen, mold, dust mite, dog, cat, cockroach as listed below Begin cetirizine 10 mg once a day as needed for runny nose or itch. This will replace Xyzal. Remember to rotate to a different antihistamine about every 3 months. Some examples of over the counter antihistamines include Zyrtec (cetirizine), Xyzal (levocetirizine), Allegra (fexofenadine), and Claritin (loratidine).  Continue Flonase 2 sprays each nostril once a day as needed for stuffy nose. In the right nostril, point the applicator out toward the right ear. In the left nostril, point the applicator out toward the left ear Continue azelastine 2  sprays in each nostril twice a day as needed for runny nose Consider saline nasal rinses as needed for nasal symptoms. Use this before any medicated nasal sprays for best result Consider course of allergen immunotherapy if your symptoms are not controlled by the treatment plan as listed above  Allergic conjunctivitis Continue Pataday eyedrops 1 drop in each eye once a day as needed for red or itchy eyes  Food allergy Continue to avoid fish and shellfish. In case of an allergic reaction, take Benadryl 50 mg  every 4 hours, and if life-threatening symptoms occur, inject with AuviQ 0.3 mg.  Call the clinic if this treatment plan is not working well for you.  Follow up in 1 year or sooner if needed.   Return in about 1 year (around 02/24/2022), or if symptoms worsen or fail to improve.    Thank you for the opportunity to care for this patient.  Please do not hesitate to contact me with questions.  Thermon Leyland, FNP [facility]  I have provided oversight concerning Thermon Leyland' evaluation and treatment of this patient's health issues addressed during today's encounter. I agree with the assessment and therapeutic plan as outlined in the note.   Signed,   Jessica Priest, MD,  Allergy and Immunology,  [facility].

## 2021-02-27 ENCOUNTER — Other Ambulatory Visit (HOSPITAL_COMMUNITY): Payer: Self-pay

## 2021-02-27 MED FILL — Bictegravir-Emtricitabine-Tenofovir AF Tab 50-200-25 MG: ORAL | 30 days supply | Qty: 30 | Fill #2 | Status: AC

## 2021-02-28 ENCOUNTER — Other Ambulatory Visit (HOSPITAL_COMMUNITY): Payer: Self-pay

## 2021-03-21 ENCOUNTER — Other Ambulatory Visit (HOSPITAL_COMMUNITY): Payer: Self-pay

## 2021-03-21 ENCOUNTER — Other Ambulatory Visit: Payer: 59

## 2021-03-21 ENCOUNTER — Other Ambulatory Visit: Payer: Self-pay

## 2021-03-21 DIAGNOSIS — B20 Human immunodeficiency virus [HIV] disease: Secondary | ICD-10-CM

## 2021-03-21 DIAGNOSIS — A539 Syphilis, unspecified: Secondary | ICD-10-CM

## 2021-03-21 DIAGNOSIS — Z79899 Other long term (current) drug therapy: Secondary | ICD-10-CM

## 2021-03-21 MED FILL — Bictegravir-Emtricitabine-Tenofovir AF Tab 50-200-25 MG: ORAL | 30 days supply | Qty: 30 | Fill #3 | Status: AC

## 2021-03-22 LAB — T-HELPER CELL (CD4) - (RCID CLINIC ONLY)
CD4 % Helper T Cell: 24 % — ABNORMAL LOW (ref 33–65)
CD4 T Cell Abs: 499 /uL (ref 400–1790)

## 2021-03-25 LAB — COMPREHENSIVE METABOLIC PANEL
AG Ratio: 1.1 (calc) (ref 1.0–2.5)
ALT: 22 U/L (ref 9–46)
AST: 24 U/L (ref 10–40)
Albumin: 3.9 g/dL (ref 3.6–5.1)
Alkaline phosphatase (APISO): 87 U/L (ref 36–130)
BUN: 11 mg/dL (ref 7–25)
CO2: 26 mmol/L (ref 20–32)
Calcium: 9 mg/dL (ref 8.6–10.3)
Chloride: 105 mmol/L (ref 98–110)
Creat: 0.97 mg/dL (ref 0.60–1.35)
Globulin: 3.6 g/dL (calc) (ref 1.9–3.7)
Glucose, Bld: 99 mg/dL (ref 65–99)
Potassium: 4.3 mmol/L (ref 3.5–5.3)
Sodium: 138 mmol/L (ref 135–146)
Total Bilirubin: 0.5 mg/dL (ref 0.2–1.2)
Total Protein: 7.5 g/dL (ref 6.1–8.1)

## 2021-03-25 LAB — LIPID PANEL
Cholesterol: 223 mg/dL — ABNORMAL HIGH (ref <200)
HDL: 58 mg/dL (ref >=40)
LDL Cholesterol (Calc): 148 mg/dL (calc) — ABNORMAL HIGH
Non-HDL Cholesterol (Calc): 165 mg/dL (calc) — ABNORMAL HIGH (ref <130)
Total CHOL/HDL Ratio: 3.8 (calc) (ref <5.0)
Triglycerides: 72 mg/dL (ref <150)

## 2021-03-25 LAB — RPR TITER: RPR Titer: 1:4 {titer} — ABNORMAL HIGH

## 2021-03-25 LAB — HIV-1 RNA QUANT-NO REFLEX-BLD
HIV 1 RNA Quant: NOT DETECTED Copies/mL
HIV-1 RNA Quant, Log: NOT DETECTED Log cps/mL

## 2021-03-25 LAB — RPR: RPR Ser Ql: REACTIVE — AB

## 2021-03-25 LAB — FLUORESCENT TREPONEMAL AB(FTA)-IGG-BLD: Fluorescent Treponemal ABS: REACTIVE — AB

## 2021-03-30 ENCOUNTER — Other Ambulatory Visit (HOSPITAL_COMMUNITY): Payer: Self-pay

## 2021-04-04 ENCOUNTER — Encounter: Payer: 59 | Admitting: Family

## 2021-04-05 ENCOUNTER — Other Ambulatory Visit (HOSPITAL_COMMUNITY): Payer: Self-pay

## 2021-04-05 ENCOUNTER — Other Ambulatory Visit: Payer: Self-pay

## 2021-04-05 ENCOUNTER — Ambulatory Visit: Payer: 59 | Admitting: Family

## 2021-04-05 ENCOUNTER — Encounter: Payer: Self-pay | Admitting: Family

## 2021-04-05 VITALS — BP 116/80 | HR 78 | Temp 97.4°F | Wt 295.0 lb

## 2021-04-05 DIAGNOSIS — A539 Syphilis, unspecified: Secondary | ICD-10-CM | POA: Diagnosis not present

## 2021-04-05 DIAGNOSIS — B20 Human immunodeficiency virus [HIV] disease: Secondary | ICD-10-CM

## 2021-04-05 DIAGNOSIS — Z0000 Encounter for general adult medical examination without abnormal findings: Secondary | ICD-10-CM | POA: Diagnosis not present

## 2021-04-05 MED ORDER — BICTEGRAVIR-EMTRICITAB-TENOFOV 50-200-25 MG PO TABS
1.0000 | ORAL_TABLET | Freq: Every day | ORAL | 5 refills | Status: DC
  Filled 2021-04-05 – 2021-04-17 (×2): qty 30, 30d supply, fill #0
  Filled 2021-05-19: qty 30, 30d supply, fill #1
  Filled 2021-06-22: qty 30, 30d supply, fill #2
  Filled 2021-07-28: qty 30, 30d supply, fill #3

## 2021-04-05 NOTE — Assessment & Plan Note (Signed)
   Discussed importance of safe sexual practice to reduce risk of STI.  Condoms offered and declined.  Routine dental care scheduled.  Due for tetanus which he declines today.

## 2021-04-05 NOTE — Assessment & Plan Note (Signed)
RPR titer remains serofast at 1: 4.  No current symptoms.  Continue to monitor.

## 2021-04-05 NOTE — Patient Instructions (Addendum)
Nice to see you.  Continue to take your medication daily as prescribed.  Refills have been sent to the pharmacy.  Plan for follow up in 4 months or sooner if needed with lab work 1-2 weeks prior to appointment.   Have a great day and stay safe!  

## 2021-04-05 NOTE — Assessment & Plan Note (Signed)
Mr. Kittleson continues to have well-controlled HIV disease.  No signs/symptoms of opportunistic infection or progressive HIV.  We reviewed lab work and discussed plan of care.  Continue current dose of Biktarvy.  Plan for follow-up in 4 months or sooner if needed with lab work 1 to 2 weeks prior to appointment.

## 2021-04-05 NOTE — Progress Notes (Signed)
Brief Narrative   Patient ID: Charles Morrison, male    DOB: 1980-09-30, 40 y.o.   MRN: [id number]    Subjective:    Chief Complaint  Patient presents with   HIV Positive/AIDS      HPI:  Charles Morrison is a 40 y.o. male with HIV disease last seen on 11/22/2020 with well-controlled virus and good adherence and tolerance to his ART regimen of Biktarvy viral load at the time was undetectable with CD4 count of 364.  Most recent blood work completed on 03/21/2021 with viral load that remains undetectable with CD4 count of 499.  Here today for routine follow-up.  Charles Morrison continues to take his Biktarvy daily as prescribed with no adverse side effects.  Overall feeling well today with no new concerns/complaints. Denies fevers, chills, night sweats, headaches, changes in vision, neck pain/stiffness, nausea, diarrhea, vomiting, lesions or rashes.  Charles Morrison has no problems obtaining medication from the pharmacy and remains covered with [place] [employer].  Denies feelings of being down, depressed, or hopeless recently.  No recreational or illicit drug use or tobacco use.  Alcohol consumption is on occasion.  Healthcare maintenance due includes tetanus.  Due for routine dental care which is scheduled.    Allergies  Allergen Reactions   Amoxicillin Rash    Also has mild lip and facial swelling   Shellfish Allergy Rash    AVOIDS ALL SHELLFISH AND FISH Also had mild lip and facial swelling      Outpatient Medications Prior to Visit  Medication Sig Dispense Refill   albuterol (PROVENTIL HFA;VENTOLIN HFA) 108 (90 Base) MCG/ACT inhaler Inhale 2 puffs into the lungs every 6 (six) hours as needed for wheezing or shortness of breath. 2 puffs 3 times daily x5 days then every 6 hours as needed. 1 Inhaler 0   azelastine (ASTELIN) 0.1 % nasal spray Place 2 sprays into both nostrils 2 (two) times daily as needed for rhinitis. Use in each nostril as directed 30 mL 5   EPINEPHrine (AUVI-Q) 0.3  mg/0.3 mL IJ SOAJ injection Inject 0.3 mg into the muscle as needed for anaphylaxis. 1 each 1   fluticasone (FLONASE) 50 MCG/ACT nasal spray Place 2 sprays into both nostrils daily as needed for allergies or rhinitis. 16 g 5   levocetirizine (XYZAL) 5 MG tablet Take 5 mg by mouth every evening.     Multiple Vitamins-Minerals (MULTIVITAMIN ADULT) CHEW Chew 1 tablet by mouth 2 (two) times daily.     Olopatadine HCl (PATADAY) 0.2 % SOLN Place 1 drop into both eyes daily as needed. 2.5 mL 5   bictegravir-emtricitabine-tenofovir AF (BIKTARVY) 50-200-25 MG TABS tablet TAKE 1 TABLET BY MOUTH DAILY. 30 tablet 5   No facility-administered medications prior to visit.     Past Medical History:  Diagnosis Date   HIV infection (HCC)    Medical history non-contributory      Past Surgical History:  Procedure Laterality Date   NO PAST SURGERIES     NO PAST SURGERIES         Review of Systems  Constitutional:  Negative for appetite change, chills, fatigue, fever and unexpected weight change.  Eyes:  Negative for visual disturbance.  Respiratory:  Negative for cough, chest tightness, shortness of breath and wheezing.   Cardiovascular:  Negative for chest pain and leg swelling.  Gastrointestinal:  Negative for abdominal pain, constipation, diarrhea, nausea and vomiting.  Genitourinary:  Negative for dysuria, flank pain, frequency, genital sores, hematuria and urgency.  Skin:  Negative for rash.  Allergic/Immunologic: Negative for immunocompromised state.  Neurological:  Negative for dizziness and headaches.     Objective:    BP 116/80   Pulse 78   Temp (!) 97.4 F (36.3 C) (Oral)   Wt 295 lb (133.8 kg)   BMI 42.33 kg/m  Nursing note and vital signs reviewed.  Physical Exam Constitutional:      General: He is not in acute distress.    Appearance: He is well-developed.  Eyes:     Conjunctiva/sclera: Conjunctivae normal.  Cardiovascular:     Rate and Rhythm: Normal rate and regular  rhythm.     Heart sounds: Normal heart sounds. No murmur heard.   No friction rub. No gallop.  Pulmonary:     Effort: Pulmonary effort is normal. No respiratory distress.     Breath sounds: Normal breath sounds. No wheezing or rales.  Chest:     Chest wall: No tenderness.  Abdominal:     General: Bowel sounds are normal.     Palpations: Abdomen is soft.     Tenderness: There is no abdominal tenderness.  Musculoskeletal:     Cervical back: Neck supple.  Lymphadenopathy:     Cervical: No cervical adenopathy.  Skin:    General: Skin is warm and dry.     Findings: No rash.  Neurological:     Mental Status: He is alert and oriented to person, place, and time.  Psychiatric:        Behavior: Behavior normal.        Thought Content: Thought content normal.        Judgment: Judgment normal.     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  No signs/symptoms of opportunistic infection or progressive HIV.  We reviewed lab work and discussed plan of care.  Continue current dose of Biktarvy.   Plan for follow-up in 4 months or sooner if needed with lab work 1 to 2 weeks prior to appointment.       Relevant Medications   bictegravir-emtricitabine-tenofovir AF (BIKTARVY) 50-200-25 MG TABS tablet   Other Relevant Orders   HIV-1 RNA quant-no reflex-bld   T-helper cell (CD4)- ([facility] clinic only)   Healthcare maintenance    Discussed importance of safe sexual practice to reduce risk of STI.  Condoms offered and declined. Routine dental care scheduled. Due for tetanus which he declines today.       Syphilis    RPR titer remains serofast at  1: 4.  No current symptoms.  Continue to monitor.       Relevant Medications   bictegravir-emtricitabine-tenofovir AF (BIKTARVY) 50-200-25 MG TABS tablet   Other Relevant Orders   RPR     I am having [name] maintain his Multivitamin Adult, albuterol, levocetirizine, Olopatadine HCl, fluticasone, azelastine, EPINEPHrine, and bictegravir-emtricitabine-tenofovir AF.   Meds ordered this encounter  Medications   bictegravir-emtricitabine-tenofovir AF (BIKTARVY) 50-200-25 MG TABS tablet    Sig: Take 1 tablet by mouth daily.    Dispense:  30 tablet    Refill:  5    Order Specific Question:   Supervising Provider    Answer:   Judyann Munson [[id number]]      Follow-up: Return in about 4 months (around 08/06/2021), or if symptoms worsen or fail to improve.   Marcos Eke, MSN, FNP-C Nurse Practitioner [facility]ious [facility] Main number: [phone]

## 2021-04-17 ENCOUNTER — Other Ambulatory Visit (HOSPITAL_COMMUNITY): Payer: Self-pay

## 2021-04-24 ENCOUNTER — Other Ambulatory Visit (HOSPITAL_COMMUNITY): Payer: Self-pay

## 2021-05-19 ENCOUNTER — Other Ambulatory Visit (HOSPITAL_COMMUNITY): Payer: Self-pay

## 2021-05-23 ENCOUNTER — Other Ambulatory Visit (HOSPITAL_COMMUNITY): Payer: Self-pay

## 2021-06-22 ENCOUNTER — Other Ambulatory Visit (HOSPITAL_COMMUNITY): Payer: Self-pay

## 2021-06-27 ENCOUNTER — Other Ambulatory Visit (HOSPITAL_COMMUNITY): Payer: Self-pay

## 2021-07-24 ENCOUNTER — Other Ambulatory Visit (HOSPITAL_COMMUNITY): Payer: Self-pay

## 2021-07-26 ENCOUNTER — Other Ambulatory Visit (HOSPITAL_COMMUNITY): Payer: Self-pay

## 2021-07-28 ENCOUNTER — Other Ambulatory Visit (HOSPITAL_COMMUNITY): Payer: Self-pay

## 2021-08-01 ENCOUNTER — Other Ambulatory Visit (HOSPITAL_COMMUNITY): Payer: Self-pay

## 2021-08-21 ENCOUNTER — Encounter: Payer: Self-pay | Admitting: Family

## 2021-08-21 ENCOUNTER — Other Ambulatory Visit: Payer: Self-pay

## 2021-08-21 ENCOUNTER — Ambulatory Visit: Payer: 59 | Admitting: Family

## 2021-08-21 ENCOUNTER — Ambulatory Visit (INDEPENDENT_AMBULATORY_CARE_PROVIDER_SITE_OTHER): Payer: 59

## 2021-08-21 ENCOUNTER — Other Ambulatory Visit (HOSPITAL_COMMUNITY): Payer: Self-pay

## 2021-08-21 VITALS — BP 127/87 | HR 69 | Resp 16 | Ht 70.0 in | Wt 297.0 lb

## 2021-08-21 DIAGNOSIS — B20 Human immunodeficiency virus [HIV] disease: Secondary | ICD-10-CM

## 2021-08-21 DIAGNOSIS — Z113 Encounter for screening for infections with a predominantly sexual mode of transmission: Secondary | ICD-10-CM

## 2021-08-21 DIAGNOSIS — Z23 Encounter for immunization: Secondary | ICD-10-CM

## 2021-08-21 DIAGNOSIS — Z0000 Encounter for general adult medical examination without abnormal findings: Secondary | ICD-10-CM | POA: Diagnosis not present

## 2021-08-21 MED ORDER — BICTEGRAVIR-EMTRICITAB-TENOFOV 50-200-25 MG PO TABS
1.0000 | ORAL_TABLET | Freq: Every day | ORAL | 5 refills | Status: DC
  Filled 2021-08-21: qty 30, 30d supply, fill #0
  Filled 2021-10-03: qty 30, 30d supply, fill #1
  Filled 2021-11-08: qty 30, 30d supply, fill #2
  Filled 2021-12-06: qty 30, 30d supply, fill #3
  Filled 2022-01-02: qty 30, 30d supply, fill #4
  Filled 2022-01-31: qty 30, 30d supply, fill #5

## 2021-08-21 NOTE — Patient Instructions (Signed)
Nice to see you.  We will check your lab work today.  Continue to take your medication daily as prescribed.  Refills have been sent to the pharmacy.  Plan for follow up in 6 months or sooner if needed with lab work on the same day.  Have a great day and stay safe!  Have a great holiday!

## 2021-08-21 NOTE — Assessment & Plan Note (Signed)
Charles Morrison continues to have well-controlled virus with good adherence and tolerance to his ART regimen of Biktarvy.  No signs/symptoms of opportunistic infection.  We reviewed previous lab work and discussed plan of care.  Continue current dose of Biktarvy.  Check blood work today.  Plan for follow-up in 6 months or sooner if needed with lab work on the same day.

## 2021-08-21 NOTE — Progress Notes (Signed)
Brief Narrative   Patient ID: Charles Morrison, male    DOB: [birth year], 40 y.o.   MRN: [id number]  Charles Morrison is a 40 y/o AA gentleman diagnosed with HIV in March 2019 with risk factor of MSM. Initial lab work on 05/15/18 with viral load of 154,000 and CD4 count 90. No genotype was able to be performed. Entered care at [facility] Stage III. History of PCP pneumonia. ART history with Biktarvy.   Subjective:    Chief Complaint  Patient presents with   Follow-up    Condoms offered and declined. Will get Covid Booster today and requested labs.     HPI:  Charles Morrison is a 40 y.o. male with HIV disease last seen on 04/05/2021 with well-controlled virus and good adherence and tolerance to his ART regimen of Biktarvy.  Viral load at the time was undetectable with CD4 count of 499.  RPR remains serofast at 1: 4 down from treatment level of 1: 128.  Here today for routine follow-up.  Charles Morrison continues to take his Biktarvy daily as prescribed with no adverse side effects.  Overall feeling well today with no new concerns/complaints. Denies fevers, chills, night sweats, headaches, changes in vision, neck pain/stiffness, nausea, diarrhea, vomiting, lesions or rashes.  Charles Morrison has no problems obtaining medications from the pharmacy.  Recently lost his aunt and one of his pet dogs so he has been sad and grieving.  Has a good support system around him.  No current recreational or illicit drug use, tobacco use,  with occasional alcohol consumption. Condoms offered. Would like Covid booster.     Allergies  Allergen Reactions   Amoxicillin Rash    Also has mild lip and facial swelling   Shellfish Allergy Rash    AVOIDS ALL SHELLFISH AND FISH Also had mild lip and facial swelling      Outpatient Medications Prior to Visit  Medication Sig Dispense Refill   albuterol (PROVENTIL HFA;VENTOLIN HFA) 108 (90 Base) MCG/ACT inhaler Inhale 2 puffs into the lungs every 6 (six) hours as needed for  wheezing or shortness of breath. 2 puffs 3 times daily x5 days then every 6 hours as needed. 1 Inhaler 0   azelastine (ASTELIN) 0.1 % nasal spray Place 2 sprays into both nostrils 2 (two) times daily as needed for rhinitis. Use in each nostril as directed 30 mL 5   EPINEPHrine (AUVI-Q) 0.3 mg/0.3 mL IJ SOAJ injection Inject 0.3 mg into the muscle as needed for anaphylaxis. 1 each 1   fluticasone (FLONASE) 50 MCG/ACT nasal spray Place 2 sprays into both nostrils daily as needed for allergies or rhinitis. 16 g 5   levocetirizine (XYZAL) 5 MG tablet Take 5 mg by mouth every evening.     Multiple Vitamins-Minerals (MULTIVITAMIN ADULT) CHEW Chew 1 tablet by mouth 2 (two) times daily.     Olopatadine HCl (PATADAY) 0.2 % SOLN Place 1 drop into both eyes daily as needed. 2.5 mL 5   bictegravir-emtricitabine-tenofovir AF (BIKTARVY) 50-200-25 MG TABS tablet Take 1 tablet by mouth daily. 30 tablet 5   No facility-administered medications prior to visit.     Past Medical History:  Diagnosis Date   HIV infection (HCC)    Medical history non-contributory      Past Surgical History:  Procedure Laterality Date   NO PAST SURGERIES     NO PAST SURGERIES        Review of Systems  Constitutional:  Negative for appetite change, chills, fatigue, fever  and unexpected weight change.  Eyes:  Negative for visual disturbance.  Respiratory:  Negative for cough, chest tightness, shortness of breath and wheezing.   Cardiovascular:  Negative for chest pain and leg swelling.  Gastrointestinal:  Negative for abdominal pain, constipation, diarrhea, nausea and vomiting.  Genitourinary:  Negative for dysuria, flank pain, frequency, genital sores, hematuria and urgency.  Skin:  Negative for rash.  Allergic/Immunologic: Negative for immunocompromised state.  Neurological:  Negative for dizziness and headaches.     Objective:    BP 127/87   Pulse 69   Resp 16   Ht 5\' 10"  (1.778 m)   Wt 297 lb (134.7 kg)    SpO2 95%   BMI 42.62 kg/m  Nursing note and vital signs reviewed.  Physical Exam Constitutional:      General: He is not in acute distress.    Appearance: He is well-developed.  Eyes:     Conjunctiva/sclera: Conjunctivae normal.  Cardiovascular:     Rate and Rhythm: Normal rate and regular rhythm.     Heart sounds: Normal heart sounds. No murmur heard.   No friction rub. No gallop.  Pulmonary:     Effort: Pulmonary effort is normal. No respiratory distress.     Breath sounds: Normal breath sounds. No wheezing or rales.  Chest:     Chest wall: No tenderness.  Abdominal:     General: Bowel sounds are normal.     Palpations: Abdomen is soft.     Tenderness: There is no abdominal tenderness.  Musculoskeletal:     Cervical back: Neck supple.  Lymphadenopathy:     Cervical: No cervical adenopathy.  Skin:    General: Skin is warm and dry.     Findings: No rash.  Neurological:     Mental Status: He is alert and oriented to person, place, and time.  Psychiatric:        Behavior: Behavior normal.        Thought Content: Thought content normal.        Judgment: Judgment normal.     Depression screen [facility] 2/9 08/21/2021 11/22/2020 07/26/2020 09/21/2019 05/04/2019  Decreased Interest 0 0 0 0 0  Down, Depressed, Hopeless 0 0 0 0 0  PHQ - 2 Score 0 0 0 0 0       Assessment & Plan:    Patient Active Problem List   Diagnosis Date Noted   Allergic conjunctivitis of both eyes 02/24/2021   Perennial and seasonal allergic rhinitis 08/31/2020   Allergic conjunctivitis 08/31/2020   Food allergy 08/31/2020   Syphilis 07/26/2020   Healthcare maintenance 09/18/2018   HIV disease ([place]) 05/30/2018   Hypoxemia    Liver mass, 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  No signs/symptoms of opportunistic infection.  We reviewed previous lab work and discussed plan of care.  Continue current dose of Biktarvy.  Check blood work today.  Plan for follow-up in 6 months or sooner if needed with lab work on the same day.      Relevant Medications  bictegravir-emtricitabine-tenofovir AF (BIKTARVY) 50-200-25 MG TABS tablet   Other Relevant Orders   T-helper cell (CD4)- ([facility] clinic only)   HIV-1 RNA quant-no reflex-bld   COMPLETE METABOLIC PANEL WITH GFR   Healthcare maintenance    Discussed importance of safe sexual practices and condom use.  Condoms offered. COVID booster updated      Other Visit Diagnoses     Screening for STDs (sexually transmitted diseases)    -  Primary   Relevant Orders   RPR        I am having Charles Morrison maintain his Multivitamin Adult, albuterol, levocetirizine, Olopatadine HCl, fluticasone, azelastine, EPINEPHrine, and bictegravir-emtricitabine-tenofovir AF.   Meds ordered this encounter  Medications   bictegravir-emtricitabine-tenofovir AF (BIKTARVY) 50-200-25 MG TABS tablet    Sig: Take 1 tablet by mouth daily.    Dispense:  30 tablet    Refill:  5    Order Specific Question:   Supervising Provider    Answer:   Carlyle Basques [[id number]]     Follow-up: Return in about 6 months (around 02/18/2022).   Terri Piedra, MSN, FNP-C Nurse Practitioner [facility] for Infectious Disease [place] number: [phone]

## 2021-08-21 NOTE — Assessment & Plan Note (Signed)
   Discussed importance of safe sexual practices and condom use.  Condoms offered.  COVID booster updated. 

## 2021-08-21 NOTE — Progress Notes (Signed)
   Covid-19 Vaccination Clinic  Name:  Charles Morrison    MRN: [id number] DOB: 1980/10/19  08/21/2021  [name] was observed post Covid-19 immunization for 15 minutes without incident. He was provided with Vaccine Information Sheet and instruction to access the V-Safe system.   [name] was instructed to call [phone] with any severe reactions post vaccine: Difficulty breathing  Swelling of face and throat  A fast heartbeat  A bad rash all over body  Dizziness and weakness     Clayborne Artist CMA

## 2021-08-22 LAB — T-HELPER CELL (CD4) - (RCID CLINIC ONLY)
CD4 % Helper T Cell: 26 % — ABNORMAL LOW (ref 33–65)
CD4 T Cell Abs: 513 /uL (ref 400–1790)

## 2021-08-23 LAB — COMPLETE METABOLIC PANEL WITH GFR
AG Ratio: 1.1 (calc) (ref 1.0–2.5)
ALT: 38 U/L (ref 9–46)
AST: 29 U/L (ref 10–40)
Albumin: 3.9 g/dL (ref 3.6–5.1)
Alkaline phosphatase (APISO): 91 U/L (ref 36–130)
BUN: 16 mg/dL (ref 7–25)
CO2: 28 mmol/L (ref 20–32)
Calcium: 9 mg/dL (ref 8.6–10.3)
Chloride: 105 mmol/L (ref 98–110)
Creat: 1.01 mg/dL (ref 0.60–1.29)
Globulin: 3.7 g/dL (calc) (ref 1.9–3.7)
Glucose, Bld: 100 mg/dL — ABNORMAL HIGH (ref 65–99)
Potassium: 4.4 mmol/L (ref 3.5–5.3)
Sodium: 139 mmol/L (ref 135–146)
Total Bilirubin: 0.5 mg/dL (ref 0.2–1.2)
Total Protein: 7.6 g/dL (ref 6.1–8.1)
eGFR: 96 mL/min/{1.73_m2} (ref >=60)

## 2021-08-23 LAB — RPR TITER: RPR Titer: 1:4 {titer} — ABNORMAL HIGH

## 2021-08-23 LAB — FLUORESCENT TREPONEMAL AB(FTA)-IGG-BLD: Fluorescent Treponemal ABS: REACTIVE — AB

## 2021-08-23 LAB — HIV-1 RNA QUANT-NO REFLEX-BLD
HIV 1 RNA Quant: NOT DETECTED Copies/mL
HIV-1 RNA Quant, Log: NOT DETECTED Log cps/mL

## 2021-08-23 LAB — RPR: RPR Ser Ql: REACTIVE — AB

## 2021-08-31 ENCOUNTER — Other Ambulatory Visit (HOSPITAL_COMMUNITY): Payer: Self-pay

## 2021-09-28 ENCOUNTER — Other Ambulatory Visit (HOSPITAL_COMMUNITY): Payer: Self-pay

## 2021-10-03 ENCOUNTER — Other Ambulatory Visit (HOSPITAL_COMMUNITY): Payer: Self-pay

## 2021-10-31 ENCOUNTER — Other Ambulatory Visit (HOSPITAL_COMMUNITY): Payer: Self-pay

## 2021-11-08 ENCOUNTER — Other Ambulatory Visit (HOSPITAL_COMMUNITY): Payer: Self-pay

## 2021-12-04 ENCOUNTER — Other Ambulatory Visit (HOSPITAL_COMMUNITY): Payer: Self-pay

## 2021-12-05 ENCOUNTER — Other Ambulatory Visit (HOSPITAL_COMMUNITY): Payer: Self-pay

## 2021-12-06 ENCOUNTER — Other Ambulatory Visit (HOSPITAL_COMMUNITY): Payer: Self-pay

## 2021-12-08 ENCOUNTER — Other Ambulatory Visit (HOSPITAL_COMMUNITY): Payer: Self-pay

## 2022-01-02 ENCOUNTER — Other Ambulatory Visit (HOSPITAL_COMMUNITY): Payer: Self-pay

## 2022-01-08 ENCOUNTER — Other Ambulatory Visit (HOSPITAL_COMMUNITY): Payer: Self-pay

## 2022-01-26 ENCOUNTER — Other Ambulatory Visit (HOSPITAL_COMMUNITY): Payer: Self-pay

## 2022-01-29 ENCOUNTER — Other Ambulatory Visit (HOSPITAL_COMMUNITY): Payer: Self-pay

## 2022-01-31 ENCOUNTER — Other Ambulatory Visit (HOSPITAL_COMMUNITY): Payer: Self-pay

## 2022-02-06 ENCOUNTER — Other Ambulatory Visit (HOSPITAL_COMMUNITY): Payer: Self-pay

## 2022-02-07 ENCOUNTER — Other Ambulatory Visit (HOSPITAL_COMMUNITY): Payer: Self-pay

## 2022-02-12 ENCOUNTER — Other Ambulatory Visit: Payer: Self-pay

## 2022-02-12 ENCOUNTER — Encounter: Payer: Self-pay | Admitting: Family

## 2022-02-12 ENCOUNTER — Ambulatory Visit: Payer: 59 | Admitting: Family

## 2022-02-12 ENCOUNTER — Other Ambulatory Visit (HOSPITAL_COMMUNITY): Payer: Self-pay

## 2022-02-12 VITALS — BP 137/91 | HR 67 | Temp 97.8°F | Ht 70.0 in | Wt 304.0 lb

## 2022-02-12 DIAGNOSIS — A539 Syphilis, unspecified: Secondary | ICD-10-CM

## 2022-02-12 DIAGNOSIS — Z0000 Encounter for general adult medical examination without abnormal findings: Secondary | ICD-10-CM

## 2022-02-12 DIAGNOSIS — B20 Human immunodeficiency virus [HIV] disease: Secondary | ICD-10-CM

## 2022-02-12 DIAGNOSIS — Z79899 Other long term (current) drug therapy: Secondary | ICD-10-CM | POA: Diagnosis not present

## 2022-02-12 MED ORDER — BICTEGRAVIR-EMTRICITAB-TENOFOV 50-200-25 MG PO TABS
1.0000 | ORAL_TABLET | Freq: Every day | ORAL | 6 refills | Status: DC
  Filled 2022-02-12 – 2022-02-28 (×2): qty 30, 30d supply, fill #0
  Filled 2022-03-30: qty 30, 30d supply, fill #1
  Filled 2022-05-03: qty 30, 30d supply, fill #2
  Filled 2022-05-31: qty 30, 30d supply, fill #3
  Filled 2022-07-04: qty 30, 30d supply, fill #4
  Filled 2022-07-31: qty 30, 30d supply, fill #5

## 2022-02-12 NOTE — Patient Instructions (Signed)
Nice to see you. ? ?We will check your lab work today. ? ?Continue to take your medication daily as prescribed. ? ?Refills have been sent to the pharmacy. ? ?Plan for follow up in 6 months or sooner if needed with lab work on the same day. ? ?Have a great day and stay safe! ? ?

## 2022-02-12 NOTE — Assessment & Plan Note (Addendum)
   Discussed importance of safe sexual practices and condom use.  Condoms offered.  Due for routine dental care which she will schedule independently.  Can refer to [facility] if needed.

## 2022-02-12 NOTE — Assessment & Plan Note (Signed)
Most recent RPR 1: 4 down from treatment level 1: 128 and appears serofast.  Recheck RPR today.  No current symptoms.

## 2022-02-12 NOTE — Assessment & Plan Note (Signed)
Mr. Middendorf has well-controlled virus with good adherence and tolerance to his ART regimen of Biktarvy.  We reviewed previous lab work and discussed plan of care.  Check blood work today.  Continue current dose of Biktarvy.  Plan for follow-up in 6 months or sooner if needed with lab work on the same day.

## 2022-02-12 NOTE — Progress Notes (Signed)
Brief Narrative   Patient ID: Charles Morrison, male    DOB: 12-28-1980, 41 y.o.   MRN: [id number]  Charles Morrison is a 41 y/o AA gentleman diagnosed with HIV in March 2019 with risk factor of MSM. Initial lab work on 05/15/18 with viral load of 154,000 and CD4 count 90. No genotype was able to be performed. Entered care at [facility] Stage III. History of PCP pneumonia. ART history with Biktarvy.   Subjective:    Chief Complaint  Patient presents with   Follow-up    B20    HPI:  Charles Morrison is a 41 y.o. male with HIV disease last seen on 08/21/2021 with well-controlled virus and good adherence and tolerance to his ART regimen of Biktarvy.  Viral load was undetectable with CD4 count of 513.  Kidney function, liver function, electrolytes within normal ranges.  RPR was serofast at 1: 4 with previous treatment at 1: 128.  Here today for routine follow-up.  Charles Morrison continues to take his Biktarvy daily as prescribed with no adverse side effects.  Overall feeling well today with no new concerns/complaints.  Has been traveling recently. Denies fevers, chills, night sweats, headaches, changes in vision, neck pain/stiffness, nausea, diarrhea, vomiting, lesions or rashes.  Charles Morrison has no problems obtaining medication from the pharmacy.  Denies feelings of being down, depressed, or hopeless recently.  Drinks alcohol on occasion with no recreational or illicit drug use or tobacco use.  Condoms offered.  Due for routine dental care.   Allergies  Allergen Reactions   Amoxicillin Rash    Also has mild lip and facial swelling   Shellfish Allergy Rash    AVOIDS ALL SHELLFISH AND FISH Also had mild lip and facial swelling      Outpatient Medications Prior to Visit  Medication Sig Dispense Refill   albuterol (PROVENTIL HFA;VENTOLIN HFA) 108 (90 Base) MCG/ACT inhaler Inhale 2 puffs into the lungs every 6 (six) hours as needed for wheezing or shortness of breath. 2 puffs 3 times daily x5 days  then every 6 hours as needed. 1 Inhaler 0   azelastine (ASTELIN) 0.1 % nasal spray Place 2 sprays into both nostrils 2 (two) times daily as needed for rhinitis. Use in each nostril as directed 30 mL 5   EPINEPHrine (AUVI-Q) 0.3 mg/0.3 mL IJ SOAJ injection Inject 0.3 mg into the muscle as needed for anaphylaxis. 1 each 1   fluticasone (FLONASE) 50 MCG/ACT nasal spray Place 2 sprays into both nostrils daily as needed for allergies or rhinitis. 16 g 5   levocetirizine (XYZAL) 5 MG tablet Take 5 mg by mouth every evening.     Multiple Vitamins-Minerals (MULTIVITAMIN ADULT) CHEW Chew 1 tablet by mouth 2 (two) times daily.     Olopatadine HCl (PATADAY) 0.2 % SOLN Place 1 drop into both eyes daily as needed. 2.5 mL 5   bictegravir-emtricitabine-tenofovir AF (BIKTARVY) 50-200-25 MG TABS tablet Take 1 tablet by mouth daily. 30 tablet 5   No facility-administered medications prior to visit.     Past Medical History:  Diagnosis Date   HIV infection (HCC)    Medical history non-contributory      Past Surgical History:  Procedure Laterality Date   NO PAST SURGERIES     NO PAST SURGERIES        Review of Systems  Constitutional:  Negative for appetite change, chills, fatigue, fever and unexpected weight change.  Eyes:  Negative for visual disturbance.  Respiratory:  Negative for cough,  chest tightness, shortness of breath and wheezing.   Cardiovascular:  Negative for chest pain and leg swelling.  Gastrointestinal:  Negative for abdominal pain, constipation, diarrhea, nausea and vomiting.  Genitourinary:  Negative for dysuria, flank pain, frequency, genital sores, hematuria and urgency.  Skin:  Negative for rash.  Allergic/Immunologic: Negative for immunocompromised state.  Neurological:  Negative for dizziness and headaches.     Objective:    BP (!) 137/91   Pulse 67   Temp 97.8 F (36.6 C) (Oral)   Ht 5\' 10"  (1.778 m)   Wt (!) 304 lb (137.9 kg)   BMI 43.62 kg/m  Nursing note and  vital signs reviewed.  Physical Exam Constitutional:      General: He is not in acute distress.    Appearance: He is well-developed.  Eyes:     Conjunctiva/sclera: Conjunctivae normal.  Cardiovascular:     Rate and Rhythm: Normal rate and regular rhythm.     Heart sounds: Normal heart sounds. No murmur heard.   No friction rub. No gallop.  Pulmonary:     Effort: Pulmonary effort is normal. No respiratory distress.     Breath sounds: Normal breath sounds. No wheezing or rales.  Chest:     Chest wall: No tenderness.  Abdominal:     General: Bowel sounds are normal.     Palpations: Abdomen is soft.     Tenderness: There is no abdominal tenderness.  Musculoskeletal:     Cervical back: Neck supple.  Lymphadenopathy:     Cervical: No cervical adenopathy.  Skin:    General: Skin is warm and dry.     Findings: No rash.  Neurological:     Mental Status: He is alert and oriented to person, place, and time.  Psychiatric:        Behavior: Behavior normal.        Thought Content: Thought content normal.        Judgment: Judgment normal.        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 has well-controlled virus with good adherence and tolerance to his ART regimen of Biktarvy.  We reviewed previous lab work and discussed plan of care.  Check blood work today.  Continue current dose of Biktarvy.  Plan for follow-up in 6 months or sooner if needed with lab work on the same day.       Relevant Medications   bictegravir-emtricitabine-tenofovir AF (BIKTARVY) 50-200-25 MG TABS tablet   Other Relevant Orders  Comprehensive metabolic panel   HIV-1 RNA quant-no reflex-bld   T-helper cell (CD4)- ([facility] clinic only)   Healthcare maintenance    Discussed importance of safe sexual practices and condom use.  Condoms offered. Due for routine dental care which she will schedule independently.  Can refer to [facility] if needed.       Syphilis    Most recent RPR 1: 4 down from treatment level 1: 128 and appears serofast.  Recheck RPR today.  No current symptoms.       Relevant Medications   bictegravir-emtricitabine-tenofovir AF (BIKTARVY) 50-200-25 MG TABS tablet   Other Relevant Orders   RPR   Other Visit Diagnoses     Pharmacologic therapy       Relevant Orders   Lipid panel        I am having Charles Morrison maintain his Multivitamin Adult, albuterol, levocetirizine, Olopatadine HCl, fluticasone, azelastine, EPINEPHrine, and bictegravir-emtricitabine-tenofovir AF.   Meds ordered this encounter  Medications   bictegravir-emtricitabine-tenofovir AF (BIKTARVY) 50-200-25 MG TABS tablet    Sig: Take 1 tablet by mouth daily.    Dispense:  30 tablet    Refill:  6    Order Specific Question:   Supervising Provider    Answer:   Carlyle Basques [[id number]]     Follow-up: Return in about 6 months (around 08/15/2022), or if symptoms worsen or fail to improve.   Terri Piedra, MSN, FNP-C Nurse Practitioner [facility] for Infectious Disease [place] number: [phone]

## 2022-02-13 LAB — T-HELPER CELL (CD4) - (RCID CLINIC ONLY)
CD4 % Helper T Cell: 26 % — ABNORMAL LOW (ref 33–65)
CD4 T Cell Abs: 560 /uL (ref 400–1790)

## 2022-02-15 LAB — LIPID PANEL
Cholesterol: 213 mg/dL — ABNORMAL HIGH (ref <200)
HDL: 52 mg/dL (ref >=40)
LDL Cholesterol (Calc): 147 mg/dL (calc) — ABNORMAL HIGH
Non-HDL Cholesterol (Calc): 161 mg/dL (calc) — ABNORMAL HIGH (ref <130)
Total CHOL/HDL Ratio: 4.1 (calc) (ref <5.0)
Triglycerides: 52 mg/dL (ref <150)

## 2022-02-15 LAB — COMPREHENSIVE METABOLIC PANEL
AG Ratio: 1.1 (calc) (ref 1.0–2.5)
ALT: 26 U/L (ref 9–46)
AST: 21 U/L (ref 10–40)
Albumin: 3.9 g/dL (ref 3.6–5.1)
Alkaline phosphatase (APISO): 83 U/L (ref 36–130)
BUN: 15 mg/dL (ref 7–25)
CO2: 26 mmol/L (ref 20–32)
Calcium: 9 mg/dL (ref 8.6–10.3)
Chloride: 107 mmol/L (ref 98–110)
Creat: 1.07 mg/dL (ref 0.60–1.29)
Globulin: 3.7 g/dL (calc) (ref 1.9–3.7)
Glucose, Bld: 105 mg/dL — ABNORMAL HIGH (ref 65–99)
Potassium: 4.2 mmol/L (ref 3.5–5.3)
Sodium: 139 mmol/L (ref 135–146)
Total Bilirubin: 0.2 mg/dL (ref 0.2–1.2)
Total Protein: 7.6 g/dL (ref 6.1–8.1)

## 2022-02-15 LAB — FLUORESCENT TREPONEMAL AB(FTA)-IGG-BLD: Fluorescent Treponemal ABS: REACTIVE — AB

## 2022-02-15 LAB — RPR: RPR Ser Ql: REACTIVE — AB

## 2022-02-15 LAB — HIV-1 RNA QUANT-NO REFLEX-BLD
HIV 1 RNA Quant: NOT DETECTED copies/mL
HIV-1 RNA Quant, Log: NOT DETECTED Log copies/mL

## 2022-02-15 LAB — RPR TITER: RPR Titer: 1:2 {titer} — ABNORMAL HIGH

## 2022-02-28 ENCOUNTER — Other Ambulatory Visit (HOSPITAL_COMMUNITY): Payer: Self-pay

## 2022-03-06 ENCOUNTER — Other Ambulatory Visit (HOSPITAL_COMMUNITY): Payer: Self-pay

## 2022-03-26 ENCOUNTER — Other Ambulatory Visit (HOSPITAL_COMMUNITY): Payer: Self-pay

## 2022-03-28 ENCOUNTER — Other Ambulatory Visit (HOSPITAL_COMMUNITY): Payer: Self-pay

## 2022-03-30 ENCOUNTER — Other Ambulatory Visit (HOSPITAL_COMMUNITY): Payer: Self-pay

## 2022-04-05 ENCOUNTER — Other Ambulatory Visit (HOSPITAL_COMMUNITY): Payer: Self-pay

## 2022-04-30 ENCOUNTER — Other Ambulatory Visit (HOSPITAL_COMMUNITY): Payer: Self-pay

## 2022-05-03 ENCOUNTER — Other Ambulatory Visit (HOSPITAL_COMMUNITY): Payer: Self-pay

## 2022-05-08 ENCOUNTER — Other Ambulatory Visit (HOSPITAL_COMMUNITY): Payer: Self-pay

## 2022-05-31 ENCOUNTER — Other Ambulatory Visit (HOSPITAL_COMMUNITY): Payer: Self-pay

## 2022-06-05 ENCOUNTER — Other Ambulatory Visit (HOSPITAL_COMMUNITY): Payer: Self-pay

## 2022-06-28 ENCOUNTER — Other Ambulatory Visit (HOSPITAL_COMMUNITY): Payer: Self-pay

## 2022-07-02 ENCOUNTER — Other Ambulatory Visit (HOSPITAL_COMMUNITY): Payer: Self-pay

## 2022-07-04 ENCOUNTER — Other Ambulatory Visit (HOSPITAL_COMMUNITY): Payer: Self-pay

## 2022-07-27 ENCOUNTER — Other Ambulatory Visit (HOSPITAL_COMMUNITY): Payer: Self-pay

## 2022-07-30 ENCOUNTER — Other Ambulatory Visit (HOSPITAL_COMMUNITY): Payer: Self-pay

## 2022-07-31 ENCOUNTER — Other Ambulatory Visit (HOSPITAL_COMMUNITY): Payer: Self-pay

## 2022-08-03 ENCOUNTER — Other Ambulatory Visit (HOSPITAL_COMMUNITY): Payer: Self-pay

## 2022-08-21 ENCOUNTER — Ambulatory Visit: Payer: 59 | Admitting: Family

## 2022-08-21 ENCOUNTER — Ambulatory Visit (INDEPENDENT_AMBULATORY_CARE_PROVIDER_SITE_OTHER): Payer: 59

## 2022-08-21 ENCOUNTER — Other Ambulatory Visit: Payer: Self-pay

## 2022-08-21 ENCOUNTER — Other Ambulatory Visit (HOSPITAL_COMMUNITY): Payer: Self-pay

## 2022-08-21 ENCOUNTER — Encounter: Payer: Self-pay | Admitting: Family

## 2022-08-21 VITALS — BP 134/88 | HR 78 | Temp 97.5°F | Ht 70.0 in | Wt 315.0 lb

## 2022-08-21 DIAGNOSIS — Z0000 Encounter for general adult medical examination without abnormal findings: Secondary | ICD-10-CM | POA: Diagnosis not present

## 2022-08-21 DIAGNOSIS — B20 Human immunodeficiency virus [HIV] disease: Secondary | ICD-10-CM | POA: Diagnosis not present

## 2022-08-21 DIAGNOSIS — Z113 Encounter for screening for infections with a predominantly sexual mode of transmission: Secondary | ICD-10-CM | POA: Diagnosis not present

## 2022-08-21 DIAGNOSIS — Z23 Encounter for immunization: Secondary | ICD-10-CM

## 2022-08-21 DIAGNOSIS — A539 Syphilis, unspecified: Secondary | ICD-10-CM | POA: Diagnosis not present

## 2022-08-21 MED ORDER — BICTEGRAVIR-EMTRICITAB-TENOFOV 50-200-25 MG PO TABS
1.0000 | ORAL_TABLET | Freq: Every day | ORAL | 6 refills | Status: DC
  Filled 2022-08-21 – 2022-08-31 (×3): qty 30, 30d supply, fill #0
  Filled 2022-09-28: qty 30, 30d supply, fill #1
  Filled 2022-10-23: qty 30, 30d supply, fill #2
  Filled 2022-12-05: qty 30, 30d supply, fill #3
  Filled 2023-01-02: qty 30, 30d supply, fill #4
  Filled 2023-02-07: qty 30, 30d supply, fill #5

## 2022-08-21 NOTE — Assessment & Plan Note (Addendum)
Discussed importance of safe sexual practice, family planning and condom use. Condoms and STD testing offered.  Due for routine dental care and will schedule independently.  Can refer to dental clinic if necessary. Due for tetanus booster next office visit.

## 2022-08-21 NOTE — Patient Instructions (Addendum)
Nice to see you. ? ?We will check your lab work today. ? ?Continue to take your medication daily as prescribed. ? ?Refills have been sent to the pharmacy. ? ?Plan for follow up in 6 months or sooner if needed with lab work on the same day. ? ?Have a great day and stay safe! ? ?

## 2022-08-21 NOTE — Assessment & Plan Note (Signed)
Titer stable at 1:2 with slow continued decrease. No current symptoms. Recheck RPR.

## 2022-08-21 NOTE — Assessment & Plan Note (Signed)
Charles Morrison continues to have well-controlled virus with good adherence and tolerance to [employer].  Reviewed previous lab work and discussed plan of care.  Check blood work today.  Continue current dose of Biktarvy.  Plan for follow-up in 6 months or sooner if needed with lab work on the same day.

## 2022-08-21 NOTE — Progress Notes (Signed)
Brief Narrative   Patient ID: Charles Morrison, male    DOB: 03/25/1981, 41 y.o.   MRN: [id number]  [name] is a 41 y/o AA gentleman diagnosed with HIV in March 2019 with risk factor of MSM. Initial lab work on 05/15/18 with viral load of 154,000 and CD4 count 90. No genotype was able to be performed. Entered care at [facility] Stage III. History of PCP pneumonia. ART history with Biktarvy.    Subjective:    Chief Complaint  Patient presents with   Follow-up   HIV Positive/AIDS    HPI:  Charles Morrison is a 41 y.o. male with HIV disease last seen on 02/12/2022 with well-controlled virus and good adherence and tolerance to [employer].  Viral load was undetectable with CD4 count of 560.  RPR titer was down to 1: 2.  Kidney function, liver function, electrolytes within normal ranges.  Lipid profile with triglycerides 52, LDL 147, and HDL 52.  Here today for routine follow-up.  Charles Morrison has been doing well since his last office visit and continues to take his Biktarvy as prescribed with no adverse side effects and no problems obtaining medication from the pharmacy.  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  No new concerns/complaints.  Condoms and STD testing offered.  Due for routine dental care.  Denies fevers, chills, night sweats, headaches, changes in vision, neck pain/stiffness, nausea, diarrhea, vomiting, lesions or rashes.     Allergies  Allergen Reactions   Amoxicillin Rash    Also has mild lip and facial swelling   Shellfish Allergy Rash    AVOIDS ALL SHELLFISH AND FISH Also had mild lip and facial swelling      Outpatient Medications Prior to Visit  Medication Sig Dispense Refill   albuterol (PROVENTIL HFA;VENTOLIN HFA) 108 (90 Base) MCG/ACT inhaler Inhale 2 puffs into the lungs every 6 (six) hours as needed for wheezing or shortness of breath. 2 puffs 3 times daily x5 days then every  6 hours as needed. 1 Inhaler 0   azelastine (ASTELIN) 0.1 % nasal spray Place 2 sprays into both nostrils 2 (two) times daily as needed for rhinitis. Use in each nostril as directed 30 mL 5   EPINEPHrine (AUVI-Q) 0.3 mg/0.3 mL IJ SOAJ injection Inject 0.3 mg into the muscle as needed for anaphylaxis. 1 each 1   fluticasone (FLONASE) 50 MCG/ACT nasal spray Place 2 sprays into both nostrils daily as needed for allergies or rhinitis. 16 g 5   levocetirizine (XYZAL) 5 MG tablet Take 5 mg by mouth every evening.     Multiple Vitamins-Minerals (MULTIVITAMIN ADULT) CHEW Chew 1 tablet by mouth 2 (two) times daily.     Olopatadine HCl (PATADAY) 0.2 % SOLN Place 1 drop into both eyes daily as needed. 2.5 mL 5   bictegravir-emtricitabine-tenofovir AF (BIKTARVY) 50-200-25 MG TABS tablet Take 1 tablet by mouth daily. 30 tablet 6   No facility-administered medications prior to visit.     Past Medical History:  Diagnosis Date   HIV infection (HCC)    Medical history non-contributory      Past Surgical History:  Procedure Laterality Date   NO PAST SURGERIES     NO PAST SURGERIES        Review of Systems  Constitutional:  Negative for appetite change, chills, fatigue, fever and unexpected weight change.  Eyes:  Negative for visual disturbance.  Respiratory:  Negative for cough, chest tightness, shortness of breath and wheezing.   Cardiovascular:  Negative for chest pain and leg swelling.  Gastrointestinal:  Negative for abdominal pain, constipation, diarrhea, nausea and vomiting.  Genitourinary:  Negative for dysuria, flank pain, frequency, genital sores, hematuria and urgency.  Skin:  Negative for rash.  Allergic/Immunologic: Negative for immunocompromised state.  Neurological:  Negative for dizziness and headaches.      Objective:    BP 134/88   Pulse 78   Temp (!) 97.5 F (36.4 C) (Oral)   Ht 5\' 10"  (1.778 m)   Wt (!) 315 lb (142.9 kg)   SpO2 99%   BMI 45.20 kg/m  Nursing note  and vital signs reviewed.  Physical Exam Constitutional:      General: He is not in acute distress.    Appearance: He is well-developed.  Eyes:     Conjunctiva/sclera: Conjunctivae normal.  Cardiovascular:     Rate and Rhythm: Normal rate and regular rhythm.     Heart sounds: Normal heart sounds. No murmur heard.    No friction rub. No gallop.  Pulmonary:     Effort: Pulmonary effort is normal. No respiratory distress.     Breath sounds: Normal breath sounds. No wheezing or rales.  Chest:     Chest wall: No tenderness.  Abdominal:     General: Bowel sounds are normal.     Palpations: Abdomen is soft.     Tenderness: There is no abdominal tenderness.  Musculoskeletal:     Cervical back: Neck supple.  Lymphadenopathy:     Cervical: No cervical adenopathy.  Skin:    General: Skin is warm and dry.     Findings: No rash.  Neurological:     Mental Status: He is alert and oriented to person, place, and time.  Psychiatric:        Behavior: Behavior normal.        Thought Content: Thought content normal.        Judgment: Judgment normal.         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  Reviewed previous lab work and discussed plan of care.  Check blood work today.  Continue current dose of Biktarvy.  Plan for follow-up in 6 months or sooner if needed with lab work on the same day.      Relevant Medications   bictegravir-emtricitabine-tenofovir AF (BIKTARVY) 50-200-25 MG TABS tablet  Other Relevant Orders   Basic metabolic panel   HIV-1 RNA quant-no reflex-bld   T-helper cell (CD4)- ([facility] clinic only)   Healthcare maintenance    Discussed importance of safe sexual practice, family planning and condom use. Condoms and STD testing offered.  Due for routine dental care and will schedule independently.  Can refer to dental clinic if necessary. Due for tetanus booster next office visit.      Syphilis    Titer stable at 1:2 with slow continued decrease. No current symptoms. Recheck RPR.       Relevant Medications   bictegravir-emtricitabine-tenofovir AF (BIKTARVY) 50-200-25 MG TABS tablet   Other Visit Diagnoses     Screening for STDs (sexually transmitted diseases)    -  Primary   Relevant Orders   RPR        I am having [name] maintain his Multivitamin Adult, albuterol, levocetirizine, Olopatadine HCl, fluticasone, azelastine, EPINEPHrine, and bictegravir-emtricitabine-tenofovir AF.   Meds ordered this encounter  Medications   bictegravir-emtricitabine-tenofovir AF (BIKTARVY) 50-200-25 MG TABS tablet    Sig: Take 1 tablet by mouth daily.    Dispense:  30 tablet    Refill:  6    Order Specific Question:   Supervising Provider    Answer:   Judyann Munson [[id number]]     Follow-up: Return in about 6 months (around 02/19/2023).   Marcos Eke, MSN, FNP-C Nurse Practitioner [facility] for Infectious Disease [facility] Main number: [phone]

## 2022-08-22 ENCOUNTER — Other Ambulatory Visit (HOSPITAL_COMMUNITY): Payer: Self-pay

## 2022-08-22 LAB — T-HELPER CELL (CD4) - (RCID CLINIC ONLY)
CD4 % Helper T Cell: 29 % — ABNORMAL LOW (ref 33–65)
CD4 T Cell Abs: 465 /uL (ref 400–1790)

## 2022-08-23 LAB — RPR TITER: RPR Titer: 1:1 {titer} — ABNORMAL HIGH

## 2022-08-23 LAB — HIV-1 RNA QUANT-NO REFLEX-BLD
HIV 1 RNA Quant: NOT DETECTED Copies/mL
HIV-1 RNA Quant, Log: NOT DETECTED Log cps/mL

## 2022-08-23 LAB — RPR: RPR Ser Ql: REACTIVE — AB

## 2022-08-23 LAB — BASIC METABOLIC PANEL
BUN: 14 mg/dL (ref 7–25)
CO2: 29 mmol/L (ref 20–32)
Calcium: 9.1 mg/dL (ref 8.6–10.3)
Chloride: 102 mmol/L (ref 98–110)
Creat: 0.99 mg/dL (ref 0.60–1.29)
Glucose, Bld: 110 mg/dL — ABNORMAL HIGH (ref 65–99)
Potassium: 4.3 mmol/L (ref 3.5–5.3)
Sodium: 139 mmol/L (ref 135–146)

## 2022-08-23 LAB — FLUORESCENT TREPONEMAL AB(FTA)-IGG-BLD: Fluorescent Treponemal ABS: REACTIVE — AB

## 2022-08-31 ENCOUNTER — Other Ambulatory Visit (HOSPITAL_COMMUNITY): Payer: Self-pay

## 2022-08-31 ENCOUNTER — Other Ambulatory Visit: Payer: Self-pay

## 2022-09-19 ENCOUNTER — Other Ambulatory Visit (HOSPITAL_COMMUNITY): Payer: Self-pay

## 2022-09-21 ENCOUNTER — Other Ambulatory Visit (HOSPITAL_COMMUNITY): Payer: Self-pay

## 2022-09-25 ENCOUNTER — Other Ambulatory Visit (HOSPITAL_COMMUNITY): Payer: Self-pay

## 2022-09-28 ENCOUNTER — Other Ambulatory Visit: Payer: Self-pay

## 2022-09-28 ENCOUNTER — Other Ambulatory Visit (HOSPITAL_COMMUNITY): Payer: Self-pay

## 2022-10-01 ENCOUNTER — Other Ambulatory Visit: Payer: Self-pay

## 2022-10-23 ENCOUNTER — Other Ambulatory Visit (HOSPITAL_COMMUNITY): Payer: Self-pay

## 2022-10-31 ENCOUNTER — Other Ambulatory Visit: Payer: Self-pay

## 2022-11-15 ENCOUNTER — Other Ambulatory Visit (HOSPITAL_COMMUNITY): Payer: Self-pay

## 2022-11-22 ENCOUNTER — Other Ambulatory Visit (HOSPITAL_COMMUNITY): Payer: Self-pay

## 2022-11-26 ENCOUNTER — Other Ambulatory Visit (HOSPITAL_COMMUNITY): Payer: Self-pay

## 2022-11-28 ENCOUNTER — Other Ambulatory Visit (HOSPITAL_COMMUNITY): Payer: Self-pay

## 2022-12-05 ENCOUNTER — Other Ambulatory Visit (HOSPITAL_COMMUNITY): Payer: Self-pay

## 2022-12-05 ENCOUNTER — Other Ambulatory Visit: Payer: Self-pay

## 2022-12-26 ENCOUNTER — Other Ambulatory Visit (HOSPITAL_COMMUNITY): Payer: Self-pay

## 2022-12-31 ENCOUNTER — Other Ambulatory Visit: Payer: Self-pay

## 2023-01-02 ENCOUNTER — Other Ambulatory Visit (HOSPITAL_COMMUNITY): Payer: Self-pay

## 2023-01-02 ENCOUNTER — Other Ambulatory Visit: Payer: Self-pay

## 2023-01-04 ENCOUNTER — Other Ambulatory Visit: Payer: Self-pay

## 2023-01-29 ENCOUNTER — Other Ambulatory Visit (HOSPITAL_COMMUNITY): Payer: Self-pay

## 2023-01-31 ENCOUNTER — Other Ambulatory Visit: Payer: Self-pay

## 2023-02-07 ENCOUNTER — Other Ambulatory Visit (HOSPITAL_COMMUNITY): Payer: Self-pay

## 2023-02-08 ENCOUNTER — Other Ambulatory Visit: Payer: Self-pay

## 2023-02-12 ENCOUNTER — Ambulatory Visit: Payer: 59 | Admitting: Family

## 2023-02-12 ENCOUNTER — Other Ambulatory Visit: Payer: Self-pay

## 2023-02-12 ENCOUNTER — Other Ambulatory Visit (HOSPITAL_COMMUNITY): Payer: Self-pay

## 2023-02-12 ENCOUNTER — Encounter: Payer: Self-pay | Admitting: Family

## 2023-02-12 VITALS — BP 138/86 | HR 92 | Temp 97.8°F | Ht 70.0 in | Wt 302.0 lb

## 2023-02-12 DIAGNOSIS — A539 Syphilis, unspecified: Secondary | ICD-10-CM | POA: Diagnosis not present

## 2023-02-12 DIAGNOSIS — B20 Human immunodeficiency virus [HIV] disease: Secondary | ICD-10-CM

## 2023-02-12 DIAGNOSIS — Z0000 Encounter for general adult medical examination without abnormal findings: Secondary | ICD-10-CM

## 2023-02-12 MED ORDER — BICTEGRAVIR-EMTRICITAB-TENOFOV 50-200-25 MG PO TABS
1.0000 | ORAL_TABLET | Freq: Every day | ORAL | 6 refills | Status: DC
  Filled 2023-02-12 – 2023-04-11 (×4): qty 30, 30d supply, fill #0
  Filled 2023-05-08: qty 30, 30d supply, fill #1
  Filled 2023-06-10: qty 30, 30d supply, fill #2
  Filled 2023-08-07: qty 30, 30d supply, fill #3

## 2023-02-12 NOTE — Assessment & Plan Note (Signed)
Britten continues to have well-controlled virus with good adherence and tolerance to [employer].  Reviewed previous lab work and discussed plan of care and U equals U.  Check lab work.  Continue current dose of Biktarvy.  Plan for follow-up in 6 months or sooner if needed with lab work on the same day.

## 2023-02-12 NOTE — Progress Notes (Signed)
Brief Narrative   Patient ID: Charles Morrison, male    DOB: Feb 14, 1981, 42 y.o.   MRN: [id number]  Charles Morrison is a 42 y/o AA gentleman diagnosed with HIV in March 2019 with risk factor of MSM. Initial lab work on 05/15/18 with viral load of 154,000 and CD4 count 90. No genotype was able to be performed. Entered care at [facility] Stage III. History of PCP pneumonia. ART history with Biktarvy.    Subjective:    Chief Complaint  Patient presents with   Follow-up   HIV Positive/AIDS    HPI:  Charles Morrison is a 42 y.o. male with HIV disease last seen on 08/21/22 with well controlled virus and good adherence and tolerance to Biktarvy. Viral load was undetectable and CD4 count 465. Renal function and electrolytes were within normal ranges. Here today for routine follow up.   Charles Morrison has been doing well since his last office visit and continues to take Biktarvy with no adverse side effects or problems obtaining medication.  Has a family trip planned to [place] in the coming months.  No new concerns/complaints.  Condoms and STD testing offered.  Due for routine dental care.  Healthcare maintenance due includes tetanus vaccine.  Denies fevers, chills, night sweats, headaches, changes in vision, neck pain/stiffness, nausea, diarrhea, vomiting, lesions or rashes.   Allergies  Allergen Reactions   Amoxicillin Rash    Also has mild lip and facial swelling   Shellfish Allergy Rash    AVOIDS ALL SHELLFISH AND FISH Also had mild lip and facial swelling      Outpatient Medications Prior to Visit  Medication Sig Dispense Refill   albuterol (PROVENTIL HFA;VENTOLIN HFA) 108 (90 Base) MCG/ACT inhaler Inhale 2 puffs into the lungs every 6 (six) hours as needed for wheezing or shortness of breath. 2 puffs 3 times daily x5 days then every 6 hours as needed. 1 Inhaler 0   azelastine (ASTELIN) 0.1 % nasal spray Place 2 sprays into both nostrils 2 (two) times daily as needed for rhinitis. Use in each nostril  as directed 30 mL 5   EPINEPHrine (AUVI-Q) 0.3 mg/0.3 mL IJ SOAJ injection Inject 0.3 mg into the muscle as needed for anaphylaxis. 1 each 1   fluticasone (FLONASE) 50 MCG/ACT nasal spray Place 2 sprays into both nostrils daily as needed for allergies or rhinitis. 16 g 5   levocetirizine (XYZAL) 5 MG tablet Take 5 mg by mouth every evening.     Multiple Vitamins-Minerals (MULTIVITAMIN ADULT) CHEW Chew 1 tablet by mouth 2 (two) times daily.     Olopatadine HCl (PATADAY) 0.2 % SOLN Place 1 drop into both eyes daily as needed. 2.5 mL 5   bictegravir-emtricitabine-tenofovir AF (BIKTARVY) 50-200-25 MG TABS tablet Take 1 tablet by mouth daily. 30 tablet 6   No facility-administered medications prior to visit.     Past Medical History:  Diagnosis Date   HIV infection (HCC)    Medical history non-contributory      Past Surgical History:  Procedure Laterality Date   NO PAST SURGERIES     NO PAST SURGERIES        Review of Systems  Constitutional:  Negative for appetite change, chills, fatigue, fever and unexpected weight change.  Eyes:  Negative for visual disturbance.  Respiratory:  Negative for cough, chest tightness, shortness of breath and wheezing.   Cardiovascular:  Negative for chest pain and leg swelling.  Gastrointestinal:  Negative for abdominal pain, constipation, diarrhea, nausea and vomiting.  Genitourinary:  Negative for dysuria, flank pain, frequency, genital sores, hematuria and urgency.  Skin:  Negative for rash.  Allergic/Immunologic: Negative for immunocompromised state.  Neurological:  Negative for dizziness and headaches.      Objective:    BP 138/86 (BP Location: Right Arm)   Pulse 92   Temp 97.8 F (36.6 C) (Oral)   Ht 5\' 10"  (1.778 m)   Wt (!) 302 lb (137 kg)   SpO2 95%   BMI 43.33 kg/m  Nursing note and vital signs reviewed.  Physical Exam Constitutional:      General: He is not in acute distress.    Appearance: He is well-developed.  Eyes:      Conjunctiva/sclera: Conjunctivae normal.  Cardiovascular:     Rate and Rhythm: Normal rate and regular rhythm.     Heart sounds: Normal heart sounds. No murmur heard.    No friction rub. No gallop.  Pulmonary:     Effort: Pulmonary effort is normal. No respiratory distress.     Breath sounds: Normal breath sounds. No wheezing or rales.  Chest:     Chest wall: No tenderness.  Abdominal:     General: Bowel sounds are normal.     Palpations: Abdomen is soft.     Tenderness: There is no abdominal tenderness.  Musculoskeletal:     Cervical back: Neck supple.  Lymphadenopathy:     Cervical: No cervical adenopathy.  Skin:    General: Skin is warm and dry.     Findings: No rash.  Neurological:     Mental Status: He is alert and oriented to person, place, and time.  Psychiatric:        Behavior: Behavior normal.        Thought Content: Thought content normal.        Judgment: Judgment normal.         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  Reviewed previous lab work and discussed plan of care and  U equals U.  Check lab work.  Continue current dose of Biktarvy.  Plan for follow-up in 6 months or sooner if needed with lab work on the same day.      Relevant Medications   bictegravir-emtricitabine-tenofovir AF (BIKTARVY) 50-200-25 MG TABS tablet   Other Relevant Orders   COMPLETE METABOLIC PANEL WITH GFR   HIV-1 RNA quant-no reflex-bld   T-helper cell (CD4)- ([facility] clinic only)  Healthcare maintenance    Discussed importance of safe sexual practice and condom use. Condoms and STD testing offered.  Declines vaccines. He will schedule routine dental care independently and can refer to [facility] if needed.       Syphilis    Syphilis titer down to 1: 1 from previous treatment level of 1: 128.  Reviewed lab work and described RPR test.  Check RPR.      Relevant Medications   bictegravir-emtricitabine-tenofovir AF (BIKTARVY) 50-200-25 MG TABS tablet   Other Relevant Orders   RPR     I am having [name] maintain his Multivitamin Adult, albuterol, levocetirizine, Olopatadine HCl, fluticasone, azelastine, EPINEPHrine, and bictegravir-emtricitabine-tenofovir AF.   Meds ordered this encounter  Medications   bictegravir-emtricitabine-tenofovir AF (BIKTARVY) 50-200-25 MG TABS tablet    Sig: Take 1 tablet by mouth daily.    Dispense:  30 tablet    Refill:  6     Follow-up: Return in about 6 months (around 08/15/2023).   Marcos Eke, MSN, FNP-C Nurse Practitioner [facility] for Infectious Disease [facility] Main number: [phone]

## 2023-02-12 NOTE — Patient Instructions (Signed)
Nice to see you. ? ?We will check your lab work today. ? ?Continue to take your medication daily as prescribed. ? ?Refills have been sent to the pharmacy. ? ?Plan for follow up in 6 months or sooner if needed with lab work on the same day. ? ?Have a great day and stay safe! ? ?

## 2023-02-12 NOTE — Assessment & Plan Note (Signed)
Discussed importance of safe sexual practice and condom use. Condoms and STD testing offered.  Declines vaccines. He will schedule routine dental care independently and can refer to [facility] if needed.

## 2023-02-12 NOTE — Assessment & Plan Note (Signed)
Syphilis titer down to 1: 1 from previous treatment level of 1: 128.  Reviewed lab work and described RPR test.  Check RPR.

## 2023-02-13 LAB — T-HELPER CELL (CD4) - (RCID CLINIC ONLY)
CD4 % Helper T Cell: 30 % — ABNORMAL LOW (ref 33–65)
CD4 T Cell Abs: 527 /uL (ref 400–1790)

## 2023-02-16 LAB — COMPLETE METABOLIC PANEL WITH GFR
AG Ratio: 1.1 (calc) (ref 1.0–2.5)
ALT: 32 U/L (ref 9–46)
AST: 25 U/L (ref 10–40)
Albumin: 4.1 g/dL (ref 3.6–5.1)
Alkaline phosphatase (APISO): 102 U/L (ref 36–130)
BUN: 11 mg/dL (ref 7–25)
CO2: 27 mmol/L (ref 20–32)
Calcium: 9.1 mg/dL (ref 8.6–10.3)
Chloride: 104 mmol/L (ref 98–110)
Creat: 1 mg/dL (ref 0.60–1.29)
Globulin: 3.7 g/dL (calc) (ref 1.9–3.7)
Glucose, Bld: 110 mg/dL — ABNORMAL HIGH (ref 65–99)
Potassium: 4 mmol/L (ref 3.5–5.3)
Sodium: 141 mmol/L (ref 135–146)
Total Bilirubin: 0.5 mg/dL (ref 0.2–1.2)
Total Protein: 7.8 g/dL (ref 6.1–8.1)
eGFR: 96 mL/min/{1.73_m2} (ref >=60)

## 2023-02-16 LAB — T PALLIDUM AB: T Pallidum Abs: POSITIVE — AB

## 2023-02-16 LAB — HIV-1 RNA QUANT-NO REFLEX-BLD
HIV 1 RNA Quant: NOT DETECTED Copies/mL
HIV-1 RNA Quant, Log: NOT DETECTED Log cps/mL

## 2023-02-16 LAB — RPR: RPR Ser Ql: REACTIVE — AB

## 2023-02-16 LAB — RPR TITER: RPR Titer: 1:2 {titer} — ABNORMAL HIGH

## 2023-02-19 ENCOUNTER — Other Ambulatory Visit: Payer: Self-pay

## 2023-02-25 ENCOUNTER — Other Ambulatory Visit: Payer: Self-pay

## 2023-02-27 ENCOUNTER — Other Ambulatory Visit: Payer: Self-pay

## 2023-03-21 ENCOUNTER — Other Ambulatory Visit (HOSPITAL_COMMUNITY): Payer: Self-pay

## 2023-03-21 ENCOUNTER — Encounter (HOSPITAL_COMMUNITY): Payer: Self-pay

## 2023-04-02 ENCOUNTER — Encounter: Payer: Self-pay | Admitting: Pharmacy Technician

## 2023-04-11 ENCOUNTER — Other Ambulatory Visit (HOSPITAL_COMMUNITY): Payer: Self-pay

## 2023-04-11 ENCOUNTER — Encounter (HOSPITAL_COMMUNITY): Payer: Self-pay

## 2023-05-02 ENCOUNTER — Other Ambulatory Visit (HOSPITAL_COMMUNITY): Payer: Self-pay

## 2023-05-06 ENCOUNTER — Other Ambulatory Visit (HOSPITAL_COMMUNITY): Payer: Self-pay

## 2023-05-08 ENCOUNTER — Other Ambulatory Visit (HOSPITAL_COMMUNITY): Payer: Self-pay

## 2023-05-10 ENCOUNTER — Other Ambulatory Visit (HOSPITAL_COMMUNITY): Payer: Self-pay

## 2023-06-03 ENCOUNTER — Other Ambulatory Visit (HOSPITAL_COMMUNITY): Payer: Self-pay

## 2023-06-07 ENCOUNTER — Other Ambulatory Visit (HOSPITAL_COMMUNITY): Payer: Self-pay

## 2023-06-07 ENCOUNTER — Encounter (HOSPITAL_COMMUNITY): Payer: Self-pay

## 2023-06-07 NOTE — Progress Notes (Signed)
The 10-year ASCVD risk score (Arnett DK, et al., 2019) is: 4%   Values used to calculate the score:     Age: 42 years     Sex: Male     Is Non-Hispanic African American: Yes     Diabetic: No     Tobacco smoker: No     Systolic Blood Pressure: 138 mmHg     Is BP treated: No     HDL Cholesterol: 52 mg/dL     Total Cholesterol: 213 mg/dL  Sandie Ano, RN

## 2023-06-10 ENCOUNTER — Other Ambulatory Visit (HOSPITAL_COMMUNITY): Payer: Self-pay

## 2023-07-03 ENCOUNTER — Other Ambulatory Visit: Payer: Self-pay

## 2023-07-05 ENCOUNTER — Other Ambulatory Visit: Payer: Self-pay

## 2023-07-08 ENCOUNTER — Other Ambulatory Visit (HOSPITAL_COMMUNITY): Payer: Self-pay

## 2023-08-01 ENCOUNTER — Ambulatory Visit: Payer: 59 | Admitting: Family

## 2023-08-05 ENCOUNTER — Other Ambulatory Visit (HOSPITAL_COMMUNITY): Payer: Self-pay

## 2023-08-07 ENCOUNTER — Other Ambulatory Visit: Payer: Self-pay

## 2023-08-07 ENCOUNTER — Other Ambulatory Visit (HOSPITAL_COMMUNITY): Payer: Self-pay | Admitting: Pharmacy Technician

## 2023-08-07 ENCOUNTER — Other Ambulatory Visit (HOSPITAL_COMMUNITY): Payer: Self-pay

## 2023-08-07 NOTE — Progress Notes (Signed)
Specialty Pharmacy Refill Coordination Note  Griffen Luzzi is a 42 y.o. male contacted today regarding refills of specialty medication(s) Bictegravir-Emtricitab-Tenofov   Patient requested Delivery   Delivery date: 08/13/23   Verified address: [address] [place] [address]   Medication will be filled on 08/12/2023.

## 2023-08-26 ENCOUNTER — Encounter: Payer: Self-pay | Admitting: Family

## 2023-08-26 ENCOUNTER — Ambulatory Visit: Payer: 59 | Admitting: Family

## 2023-08-26 ENCOUNTER — Other Ambulatory Visit: Payer: Self-pay

## 2023-08-26 VITALS — BP 153/101 | HR 64 | Temp 97.5°F | Ht 70.0 in | Wt 320.0 lb

## 2023-08-26 DIAGNOSIS — B20 Human immunodeficiency virus [HIV] disease: Secondary | ICD-10-CM | POA: Diagnosis not present

## 2023-08-26 DIAGNOSIS — A539 Syphilis, unspecified: Secondary | ICD-10-CM | POA: Diagnosis not present

## 2023-08-26 DIAGNOSIS — Z23 Encounter for immunization: Secondary | ICD-10-CM

## 2023-08-26 DIAGNOSIS — Z0000 Encounter for general adult medical examination without abnormal findings: Secondary | ICD-10-CM

## 2023-08-26 MED ORDER — BICTEGRAVIR-EMTRICITAB-TENOFOV 50-200-25 MG PO TABS
1.0000 | ORAL_TABLET | Freq: Every day | ORAL | 6 refills | Status: DC
  Filled 2023-08-26 – 2023-09-06 (×2): qty 30, 30d supply, fill #0
  Filled 2023-10-04: qty 30, 30d supply, fill #1
  Filled 2023-10-30: qty 30, 30d supply, fill #2
  Filled 2024-01-16: qty 30, 30d supply, fill #3
  Filled 2024-02-11: qty 30, 30d supply, fill #4

## 2023-08-26 NOTE — Assessment & Plan Note (Signed)
Discussed importance of safe sexual practice and condom use. Condoms and STD testing offered.  Vaccinations reviewed - Covid updated. Due for dental care which he will schedule independently.  Due for anal cancer screening with anal pap - declined today and will consider at next office visit.

## 2023-08-26 NOTE — Assessment & Plan Note (Signed)
Justice continues to have well controlled virus with good adherence and tolerance to [employer].  Reviewed lab work and discussed plan of care, U equals U, and family planning. Check lab work. Continue current dose of Biktarvy. Plan for follow up in  6 months or sooner if needed with lab work on the same day.

## 2023-08-26 NOTE — Patient Instructions (Addendum)
Nice to see you. ? ?We will check your lab work today. ? ?Continue to take your medication daily as prescribed. ? ?Refills have been sent to the pharmacy. ? ?Plan for follow up in 6 months or sooner if needed with lab work on the same day. ? ?Have a great day and stay safe! ? ?

## 2023-08-26 NOTE — Progress Notes (Signed)
Brief Narrative   Patient ID: Charles Morrison, male    DOB: 15-Jan-1981, 42 y.o.   MRN: [id number]  Mr. Bronkema is a 42 y/o AA gentleman diagnosed with HIV in March 2019 with risk factor of MSM. Initial lab work on 05/15/18 with viral load of 154,000 and CD4 count 90. No genotype was able to be performed. Entered care at [facility] Stage III. History of PCP pneumonia. ART history with Biktarvy.    Subjective:    Chief Complaint  Patient presents with   Follow-up   HIV Positive/AIDS    HPI:  Charles Morrison is a 42 y.o. male with HIV disease last seen on 02/12/2023 with well-controlled virus and good adherence and tolerance to [employer].  Viral load was undetectable with CD4 count 527.  Kidney function, liver function, electrolytes within normal ranges.  Glucose mildly elevated at 110 which was not fasting.  Here today for routine follow-up.  Mr. Hori has been doing okay since his last office visit and recently returned from a cruise to [place] and unfortunately lost his father who has been sick for the last 2 months and has been grieving.  Has good support system around him.  Continues to take Biktarvy as prescribed with no adverse side effects or problems obtaining medication from the pharmacy as he remains covered by [place] [employer].  No new concerns/complaints.  Condoms and STD testing offered.  Healthcare maintenance reviewed.  Due for routine dental care.  Denies fevers, chills, night sweats, headaches, changes in vision, neck pain/stiffness, nausea, diarrhea, vomiting, lesions or rashes.  Lab Results  Component Value Date   CD4TCELL 30 (L) 02/12/2023   CD4TABS 527 02/12/2023   Lab Results  Component Value Date   HIV1RNAQUANT Not Detected 02/12/2023     Allergies  Allergen Reactions   Amoxicillin Rash    Also has mild lip and facial swelling   Shellfish Allergy Rash    AVOIDS ALL SHELLFISH AND FISH Also had mild lip and facial swelling      Outpatient Medications Prior  to Visit  Medication Sig Dispense Refill   albuterol (PROVENTIL HFA;VENTOLIN HFA) 108 (90 Base) MCG/ACT inhaler Inhale 2 puffs into the lungs every 6 (six) hours as needed for wheezing or shortness of breath. 2 puffs 3 times daily x5 days then every 6 hours as needed. 1 Inhaler 0   azelastine (ASTELIN) 0.1 % nasal spray Place 2 sprays into both nostrils 2 (two) times daily as needed for rhinitis. Use in each nostril as directed 30 mL 5   EPINEPHrine (AUVI-Q) 0.3 mg/0.3 mL IJ SOAJ injection Inject 0.3 mg into the muscle as needed for anaphylaxis. 1 each 1   fluticasone (FLONASE) 50 MCG/ACT nasal spray Place 2 sprays into both nostrils daily as needed for allergies or rhinitis. 16 g 5   levocetirizine (XYZAL) 5 MG tablet Take 5 mg by mouth every evening.     Multiple Vitamins-Minerals (MULTIVITAMIN ADULT) CHEW Chew 1 tablet by mouth 2 (two) times daily.     Olopatadine HCl (PATADAY) 0.2 % SOLN Place 1 drop into both eyes daily as needed. 2.5 mL 5   bictegravir-emtricitabine-tenofovir AF (BIKTARVY) 50-200-25 MG TABS tablet Take 1 tablet by mouth daily. 30 tablet 6   No facility-administered medications prior to visit.     Past Medical History:  Diagnosis Date   HIV infection [facility])    Medical history non-contributory      Past Surgical History:  Procedure Laterality Date   NO PAST SURGERIES  NO PAST SURGERIES      Review of Systems  Constitutional:  Negative for appetite change, chills, fatigue, fever and unexpected weight change.  Eyes:  Negative for visual disturbance.  Respiratory:  Negative for cough, chest tightness, shortness of breath and wheezing.   Cardiovascular:  Negative for chest pain and leg swelling.  Gastrointestinal:  Negative for abdominal pain, constipation, diarrhea, nausea and vomiting.  Genitourinary:  Negative for dysuria, flank pain, frequency, genital sores, hematuria and urgency.  Skin:  Negative for rash.  Allergic/Immunologic: Negative for  immunocompromised state.  Neurological:  Negative for dizziness and headaches.      Objective:    BP (!) 153/101   Pulse 64   Temp (!) 97.5 F (36.4 C) (Temporal)   Ht 5\' 10"  (1.778 m)   Wt (!) 320 lb (145.2 kg)   SpO2 96%   BMI 45.92 kg/m  Nursing note and vital signs reviewed.  Physical Exam Constitutional:      General: He is not in acute distress.    Appearance: He is well-developed.  Eyes:     Conjunctiva/sclera: Conjunctivae normal.  Cardiovascular:     Rate and Rhythm: Normal rate and regular rhythm.     Heart sounds: Normal heart sounds. No murmur heard.    No friction rub. No gallop.  Pulmonary:     Effort: Pulmonary effort is normal. No respiratory distress.     Breath sounds: Normal breath sounds. No wheezing or rales.  Chest:     Chest wall: No tenderness.  Abdominal:     General: Bowel sounds are normal.     Palpations: Abdomen is soft.     Tenderness: There is no abdominal tenderness.  Musculoskeletal:     Cervical back: Neck supple.  Lymphadenopathy:     Cervical: No cervical adenopathy.  Skin:    General: Skin is warm and dry.     Findings: No rash.  Neurological:     Mental Status: He is alert and oriented to person, place, and time.  Psychiatric:        Behavior: Behavior normal.        Thought Content: Thought content normal.        Judgment: Judgment normal.         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  Reviewed lab work and discussed plan of care, U equals U, and family planning. Check lab work.  Continue current dose of Biktarvy. Plan for follow up in  6 months or sooner if needed with lab work on the same day.       Relevant Medications   bictegravir-emtricitabine-tenofovir AF (BIKTARVY) 50-200-25 MG TABS tablet   Other Relevant Orders   COMPLETE METABOLIC PANEL WITH GFR   CBC with Differential/Platelet   HIV-1 RNA quant-no reflex-bld   T-helper cell (CD4)- ([facility] clinic only)   Healthcare maintenance    Discussed importance of safe sexual practice and condom use. Condoms and STD testing offered.  Vaccinations reviewed - Covid updated. Due for dental care which he will schedule independently.  Due for anal cancer screening with anal pap - declined today and will consider at next office visit.       Syphilis    RPR is serofast at 1:2. Discussed nature of RPR testing and would not treat unless reaches 1:8 or higher. Continue to monitor RPR.       Relevant Medications   bictegravir-emtricitabine-tenofovir AF (BIKTARVY) 50-200-25 MG TABS tablet   Other Relevant Orders   RPR   Other Visit Diagnoses     Need for COVID-19 vaccine    -  Primary   Relevant Orders   [employer] Comirnaty Covid-19 Vaccine 30yrs & older (Completed)        I am having Masih Yake maintain his Multivitamin Adult, albuterol, levocetirizine, Olopatadine HCl, fluticasone, azelastine, EPINEPHrine, and bictegravir-emtricitabine-tenofovir AF.   Meds ordered this encounter  Medications   bictegravir-emtricitabine-tenofovir AF (BIKTARVY) 50-200-25 MG TABS  tablet    Sig: Take 1 tablet by mouth daily.    Dispense:  30 tablet    Refill:  6    Order Specific Question:   Supervising Provider    Answer:   Judyann Munson [[id number]]     Follow-up: Return in about 6 months (around 02/24/2024). or sooner if needed.    Marcos Eke, MSN, FNP-C Nurse Practitioner [facility] for Infectious Disease [facility] Main number: [phone]

## 2023-08-26 NOTE — Assessment & Plan Note (Signed)
RPR is serofast at 1:2. Discussed nature of RPR testing and would not treat unless reaches 1:8 or higher. Continue to monitor RPR.

## 2023-08-28 LAB — CBC WITH DIFFERENTIAL/PLATELET
Absolute Lymphocytes: 2438 {cells}/uL (ref 850–3900)
Absolute Monocytes: 317 {cells}/uL (ref 200–950)
Basophils Absolute: 28 {cells}/uL (ref 0–200)
Basophils Relative: 0.6 %
Eosinophils Absolute: 170 {cells}/uL (ref 15–500)
Eosinophils Relative: 3.7 %
HCT: 44.1 % (ref 38.5–50.0)
Hemoglobin: 14.3 g/dL (ref 13.2–17.1)
MCH: 27.1 pg (ref 27.0–33.0)
MCHC: 32.4 g/dL (ref 32.0–36.0)
MCV: 83.7 fL (ref 80.0–100.0)
MPV: 9.2 fL (ref 7.5–12.5)
Monocytes Relative: 6.9 %
Neutro Abs: 1647 {cells}/uL (ref 1500–7800)
Neutrophils Relative %: 35.8 %
Platelets: 324 10*3/uL (ref 140–400)
RBC: 5.27 10*6/uL (ref 4.20–5.80)
RDW: 16.1 % — ABNORMAL HIGH (ref 11.0–15.0)
Total Lymphocyte: 53 %
WBC: 4.6 10*3/uL (ref 3.8–10.8)

## 2023-08-28 LAB — COMPLETE METABOLIC PANEL WITH GFR
AG Ratio: 1.1 (calc) (ref 1.0–2.5)
ALT: 41 U/L (ref 9–46)
AST: 35 U/L (ref 10–40)
Albumin: 3.9 g/dL (ref 3.6–5.1)
Alkaline phosphatase (APISO): 87 U/L (ref 36–130)
BUN: 18 mg/dL (ref 7–25)
CO2: 26 mmol/L (ref 20–32)
Calcium: 9.1 mg/dL (ref 8.6–10.3)
Chloride: 105 mmol/L (ref 98–110)
Creat: 1.12 mg/dL (ref 0.60–1.29)
Globulin: 3.5 g/dL (ref 1.9–3.7)
Glucose, Bld: 101 mg/dL — ABNORMAL HIGH (ref 65–99)
Potassium: 4 mmol/L (ref 3.5–5.3)
Sodium: 140 mmol/L (ref 135–146)
Total Bilirubin: 0.4 mg/dL (ref 0.2–1.2)
Total Protein: 7.4 g/dL (ref 6.1–8.1)
eGFR: 84 mL/min/{1.73_m2} (ref >=60)

## 2023-08-28 LAB — HIV-1 RNA QUANT-NO REFLEX-BLD
HIV 1 RNA Quant: NOT DETECTED {copies}/mL
HIV-1 RNA Quant, Log: NOT DETECTED {Log_copies}/mL

## 2023-08-28 LAB — T PALLIDUM AB: T Pallidum Abs: POSITIVE — AB

## 2023-08-28 LAB — T-HELPER CELL (CD4) - (RCID CLINIC ONLY)
CD4 % Helper T Cell: 31 % — ABNORMAL LOW (ref 33–65)
CD4 T Cell Abs: 632 /uL (ref 400–1790)

## 2023-08-28 LAB — RPR TITER: RPR Titer: 1:1 {titer} — ABNORMAL HIGH

## 2023-08-28 LAB — RPR: RPR Ser Ql: REACTIVE — AB

## 2023-08-30 ENCOUNTER — Other Ambulatory Visit: Payer: Self-pay

## 2023-09-02 ENCOUNTER — Other Ambulatory Visit: Payer: Self-pay | Admitting: Pharmacist

## 2023-09-02 NOTE — Progress Notes (Signed)
Specialty Pharmacy Ongoing Clinical Assessment Note  Charles Morrison is a 42 y.o. male who is being followed by the specialty pharmacy service for RxSp HIV   Patient's specialty medication(s) reviewed today: Bictegravir-Emtricitab-Tenofov   Missed doses in the last 4 weeks: 0   Patient/Caregiver did not have any additional questions or concerns.   Therapeutic benefit summary: Patient is achieving benefit   Adverse events/side effects summary: No adverse events/side effects   Patient's therapy is appropriate to: Continue    Goals Addressed             This Visit's Progress    Achieve Undetectable HIV Viral Load < 20       Patient is on track. Patient will maintain adherence.      Comply with lab assessments       Patient is on track. Patient will adhere to provider and/or lab appointments.      Improve or maintain quality of life       Patient is on track. Patient will be monitored by provider to determine if a change in treatment plan is warranted.         Follow up:  6 months  Sagar Tengan L. Jannette Fogo, PharmD, BCIDP, AAHIVP, CPP Clinical Pharmacist Practitioner Infectious Diseases Clinical Pharmacist [facility] 09/02/2023, 11:07 AM

## 2023-09-06 ENCOUNTER — Other Ambulatory Visit: Payer: Self-pay

## 2023-09-06 ENCOUNTER — Other Ambulatory Visit (HOSPITAL_COMMUNITY): Payer: Self-pay

## 2023-09-06 ENCOUNTER — Other Ambulatory Visit (HOSPITAL_COMMUNITY): Payer: Self-pay | Admitting: Pharmacy Technician

## 2023-09-06 NOTE — Progress Notes (Signed)
Specialty Pharmacy Refill Coordination Note  Sathya Schlicher is a 42 y.o. male contacted today regarding refills of specialty medication(s) No data recorded  Patient requested (Patient-Rptd) Delivery   Delivery date: (Patient-Rptd) Sep 15, 1981 *Delivery Date 09/11/23   Verified address: (Patient-Rptd) [address] [place] [address]   Medication will be filled on 09/10/23.

## 2023-09-11 ENCOUNTER — Other Ambulatory Visit: Payer: Self-pay

## 2023-10-04 ENCOUNTER — Other Ambulatory Visit: Payer: Self-pay

## 2023-10-04 NOTE — Progress Notes (Signed)
 Specialty Pharmacy Refill Coordination Note  Charles Morrison is a 43 y.o. male contacted today regarding refills of specialty medication(s) Bictegravir-Emtricitab-Tenofov (BIKTARVY )   Patient requested Delivery   Delivery date: 10/08/23   Verified address: [address]   [place] [address]   Medication will be filled on 10/07/23.

## 2023-10-23 ENCOUNTER — Other Ambulatory Visit (HOSPITAL_COMMUNITY): Payer: Self-pay

## 2023-10-28 ENCOUNTER — Other Ambulatory Visit: Payer: Self-pay

## 2023-10-30 ENCOUNTER — Other Ambulatory Visit: Payer: Self-pay

## 2023-10-30 NOTE — Progress Notes (Signed)
 Specialty Pharmacy Refill Coordination Note  Charles Morrison is a 43 y.o. male contacted today regarding refills of specialty medication(s) Bictegravir-Emtricitab-Tenofov (BIKTARVY )   Patient requested (Patient-Rptd) Delivery   Delivery date: (Patient-Rptd) 11/05/23   Verified address: (Patient-Rptd) [address] [id number]   Medication will be filled on 11/04/23.

## 2023-11-04 ENCOUNTER — Other Ambulatory Visit: Payer: Self-pay

## 2023-11-04 NOTE — Progress Notes (Signed)
 Sent mychart message to inform of delivery delay, shipping 2/11

## 2023-11-29 ENCOUNTER — Other Ambulatory Visit: Payer: Self-pay

## 2023-12-02 ENCOUNTER — Other Ambulatory Visit: Payer: Self-pay

## 2024-01-16 ENCOUNTER — Other Ambulatory Visit: Payer: Self-pay | Admitting: Pharmacy Technician

## 2024-01-16 ENCOUNTER — Other Ambulatory Visit: Payer: Self-pay

## 2024-01-16 NOTE — Progress Notes (Signed)
 Specialty Pharmacy Refill Coordination Note  Charles Morrison is a 43 y.o. male contacted today regarding refills of specialty medication(s) Bictegravir-Emtricitab-Tenofov (BIKTARVY )   Patient requested (Patient-Rptd) Delivery   Delivery date: (Patient-Rptd) 01/21/24   Verified address: (Patient-Rptd) [address] [place] [address]   Medication will be filled on 01/20/24.

## 2024-02-11 ENCOUNTER — Other Ambulatory Visit: Payer: Self-pay | Admitting: Pharmacy Technician

## 2024-02-11 ENCOUNTER — Other Ambulatory Visit: Payer: Self-pay

## 2024-02-11 NOTE — Progress Notes (Signed)
 Specialty Pharmacy Refill Coordination Note  Charles Morrison is a 43 y.o. male contacted today regarding refills of specialty medication(s) Bictegravir-Emtricitab-Tenofov (BIKTARVY )   Patient requested (Patient-Rptd) Delivery   Delivery date: (Patient-Rptd) 02/19/24   Verified address: (Patient-Rptd) [address] [place] [address]   Medication will be filled on 02/18/24.

## 2024-02-12 NOTE — Progress Notes (Signed)
 The 10-year ASCVD risk score (Arnett DK, et al., 2019) is: 5.1%   Values used to calculate the score:     Age: 43 years     Sex: Male     Is Non-Hispanic African American: Yes     Diabetic: No     Tobacco smoker: No     Systolic Blood Pressure: 153 mmHg     Is BP treated: No     HDL Cholesterol: 52 mg/dL     Total Cholesterol: 213 mg/dL  No current statin therapy, next appointment note updated.   Clista Rainford, BSN, RN

## 2024-03-03 ENCOUNTER — Other Ambulatory Visit: Payer: Self-pay

## 2024-03-03 ENCOUNTER — Ambulatory Visit: Payer: 59 | Admitting: Family

## 2024-03-03 ENCOUNTER — Encounter: Payer: Self-pay | Admitting: Family

## 2024-03-03 ENCOUNTER — Other Ambulatory Visit (HOSPITAL_COMMUNITY): Payer: Self-pay

## 2024-03-03 VITALS — BP 136/90 | HR 73 | Ht 70.0 in | Wt 330.0 lb

## 2024-03-03 DIAGNOSIS — Z0000 Encounter for general adult medical examination without abnormal findings: Secondary | ICD-10-CM

## 2024-03-03 DIAGNOSIS — A539 Syphilis, unspecified: Secondary | ICD-10-CM

## 2024-03-03 DIAGNOSIS — B20 Human immunodeficiency virus [HIV] disease: Secondary | ICD-10-CM | POA: Diagnosis not present

## 2024-03-03 DIAGNOSIS — Z79899 Other long term (current) drug therapy: Secondary | ICD-10-CM

## 2024-03-03 DIAGNOSIS — Z23 Encounter for immunization: Secondary | ICD-10-CM | POA: Diagnosis not present

## 2024-03-03 DIAGNOSIS — Z113 Encounter for screening for infections with a predominantly sexual mode of transmission: Secondary | ICD-10-CM | POA: Diagnosis not present

## 2024-03-03 MED ORDER — BICTEGRAVIR-EMTRICITAB-TENOFOV 50-200-25 MG PO TABS
1.0000 | ORAL_TABLET | Freq: Every day | ORAL | 6 refills | Status: DC
  Filled 2024-03-03 – 2024-03-16 (×2): qty 30, 30d supply, fill #0
  Filled 2024-04-16 – 2024-04-20 (×2): qty 30, 30d supply, fill #1
  Filled 2024-05-14 – 2024-06-22 (×4): qty 30, 30d supply, fill #2
  Filled 2024-07-17: qty 30, 30d supply, fill #3
  Filled 2024-08-13: qty 30, 30d supply, fill #4

## 2024-03-03 NOTE — Patient Instructions (Addendum)
Nice to see you. ° °Continue to take your medication daily as prescribed. ° °Refills have been sent to the pharmacy. ° °Plan for follow up in 6 months or sooner if needed with lab work on the same day. ° °Have a great day and stay safe! ° °

## 2024-03-03 NOTE — Assessment & Plan Note (Addendum)
 Syphilis stable and ranging between 1:1 and 1:2. No current active infection or indication for treatment. Discussed nature of RPR testing.Check RPR.

## 2024-03-03 NOTE — Progress Notes (Signed)
 Brief Narrative   Patient ID: Charles Morrison, male    DOB: [birth year], 43 y.o.   MRN: [id number]  Charles Morrison is a 43 y/o AA gentleman diagnosed with HIV in March 2019 with risk factor of MSM. Initial lab work on 05/15/18 with viral load of 154,000 and CD4 count 90. No genotype was able to be performed. Entered care at [facility] Stage III. History of PCP pneumonia. ART history with Biktarvy .    Subjective:   Chief Complaint  Patient presents with   Follow-up    B20    HPI:  Charles Morrison is a 43 y.o. male with HIV disease last seen on 08/26/2023 with well-controlled virus and good adherence and tolerance to Biktarvy .  Viral load was undetectable with CD4 count 632.  Renal function, hepatic function, electrolytes within normal ranges.  RPR serofast at 1: 1.  Here today for routine follow-up.  Charles Morrison has been doing well since his last office visit and continues to take Biktarvy  as prescribed with no adverse side effects or problems obtaining medication from the pharmacy.  Covered by [employer].  Working full-time.  Housing, transportation, and access to food are stable.  No new concerns/complaints.  Healthcare maintenance reviewed.  Not currently sexually active. Due for routine dental care and anal cancer screening.   Denies fevers, chills, night sweats, headaches, changes in vision, neck pain/stiffness, nausea, diarrhea, vomiting, lesions or rashes.  Lab Results  Component Value Date   CD4TCELL 31 (L) 08/26/2023   CD4TABS 632 08/26/2023   Lab Results  Component Value Date   HIV1RNAQUANT Not Detected 08/26/2023     Allergies  Allergen Reactions   Amoxicillin  Rash    Also has mild lip and facial swelling   Shellfish Allergy Rash    AVOIDS ALL SHELLFISH AND FISH Also had mild lip and facial swelling      Outpatient Medications Prior to Visit  Medication Sig Dispense Refill   albuterol  (PROVENTIL  HFA;VENTOLIN  HFA) 108 (90 Base) MCG/ACT inhaler Inhale 2 puffs into  the lungs every 6 (six) hours as needed for wheezing or shortness of breath. 2 puffs 3 times daily x5 days then every 6 hours as needed. 1 Inhaler 0   azelastine  (ASTELIN ) 0.1 % nasal spray Place 2 sprays into both nostrils 2 (two) times daily as needed for rhinitis. Use in each nostril as directed 30 mL 5   EPINEPHrine  (AUVI-Q ) 0.3 mg/0.3 mL IJ SOAJ injection Inject 0.3 mg into the muscle as needed for anaphylaxis. 1 each 1   fluticasone  (FLONASE ) 50 MCG/ACT nasal spray Place 2 sprays into both nostrils daily as needed for allergies or rhinitis. 16 g 5   levocetirizine (XYZAL) 5 MG tablet Take 5 mg by mouth every evening.     Multiple Vitamins-Minerals (MULTIVITAMIN ADULT) CHEW Chew 1 tablet by mouth 2 (two) times daily.     Olopatadine  HCl (PATADAY ) 0.2 % SOLN Place 1 drop into both eyes daily as needed. 2.5 mL 5   bictegravir-emtricitabine -tenofovir  AF (BIKTARVY ) 50-200-25 MG TABS tablet Take 1 tablet by mouth daily. 30 tablet 6   No facility-administered medications prior to visit.     Past Medical History:  Diagnosis Date   HIV infection (HCC)    Medical history non-contributory      Past Surgical History:  Procedure Laterality Date   NO PAST SURGERIES     NO PAST SURGERIES          Review of Systems  Constitutional:  Negative for appetite  change, chills, fatigue, fever and unexpected weight change.  Eyes:  Negative for visual disturbance.  Respiratory:  Negative for cough, chest tightness, shortness of breath and wheezing.   Cardiovascular:  Negative for chest pain and leg swelling.  Gastrointestinal:  Negative for abdominal pain, constipation, diarrhea, nausea and vomiting.  Genitourinary:  Negative for dysuria, flank pain, frequency, genital sores, hematuria and urgency.  Skin:  Negative for rash.  Allergic/Immunologic: Negative for immunocompromised state.  Neurological:  Negative for dizziness and headaches.     Objective:   BP (!) 136/90   Pulse 73   Ht 5'  10" (1.778 m)   Wt (!) 330 lb (149.7 kg)   SpO2 96%   BMI 47.35 kg/m  Nursing note and vital signs reviewed.  Physical Exam Constitutional:      General: He is not in acute distress.    Appearance: He is well-developed.  Eyes:     Conjunctiva/sclera: Conjunctivae normal.  Cardiovascular:     Rate and Rhythm: Normal rate and regular rhythm.     Heart sounds: Normal heart sounds. No murmur heard.    No friction rub. No gallop.  Pulmonary:     Effort: Pulmonary effort is normal. No respiratory distress.     Breath sounds: Normal breath sounds. No wheezing or rales.  Chest:     Chest wall: No tenderness.  Abdominal:     General: Bowel sounds are normal.     Palpations: Abdomen is soft.     Tenderness: There is no abdominal tenderness.  Musculoskeletal:     Cervical back: Neck supple.  Lymphadenopathy:     Cervical: No cervical adenopathy.  Skin:    General: Skin is warm and dry.     Findings: No rash.  Neurological:     Mental Status: He is alert and oriented to person, place, and time.  Psychiatric:        Behavior: Behavior normal.        Thought Content: Thought content normal.        Judgment: Judgment normal.          03/03/2024    9:08 AM 08/26/2023    9:12 AM 02/12/2023    9:05 AM 08/21/2022    8:52 AM 02/12/2022    8:57 AM  Depression screen PHQ 2/9  Decreased Interest 0 0 0 0 0  Down, Depressed, Hopeless 0 0 0 0 0  PHQ - 2 Score 0 0 0 0 0  Altered sleeping 1      Tired, decreased energy 1      Change in appetite 0      Feeling bad or failure about yourself  0      Trouble concentrating 0      Moving slowly or fidgety/restless 0      Suicidal thoughts 0      PHQ-9 Score 2            03/03/2024    9:08 AM  GAD 7 : Generalized Anxiety Score  Nervous, Anxious, on Edge 0  Control/stop worrying 0  Worry too much - different things 1  Trouble relaxing 0  Restless 1  Easily annoyed or irritable 1  Afraid - awful might happen 0  Total GAD 7 Score 3   Anxiety Difficulty Not difficult at all     The 10-year ASCVD risk score (Arnett DK, et al., 2019) is: 4.1%   Values used to calculate the score:     Age: 59 years  Sex: Male     Is Non-Hispanic African American: Yes     Diabetic: No     Tobacco smoker: No     Systolic Blood Pressure: 136 mmHg     Is BP treated: No     HDL Cholesterol: 52 mg/dL     Total Cholesterol: 213 mg/dL      Assessment & Plan:    Patient Active Problem List   Diagnosis Date Noted   Allergic conjunctivitis of both eyes 02/24/2021   Perennial and seasonal allergic rhinitis 08/31/2020   Allergic conjunctivitis 08/31/2020   Food allergy 08/31/2020   Syphilis 07/26/2020   Healthcare maintenance 09/18/2018   HIV disease (HCC) 05/30/2018   Liver mass, 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  Reviewed previous lab work and discussed plan of care and U equals U.  Covered by [place] [employer] with no problems obtaining medication from the pharmacy.  Social determinants of health reviewed with no interventions indicated.  Check lab work.  Continue current dose of Biktarvy .  Plan for follow-up in 6 months or sooner if needed with lab work on the same day.      Relevant Medications   bictegravir-emtricitabine -tenofovir  AF (BIKTARVY ) 50-200-25 MG TABS tablet   Other Relevant Orders   T-helper cells (CD4) count (not at [facility])   RPR   COMPLETE METABOLIC PANEL WITHOUT GFR   CBC with Differential/Platelet   HIV-1 RNA quant-no reflex-bld   Healthcare maintenance   Discussed importance of safe sexual practice and condom use. Not currently sexually active. Condoms offered.  Vaccinations reviewed and  following counseling Menveo and Prevnar 20. Due for tetanus at next office visit. Due for routine dental care which he will schedule.  Discussed anal cancer screening and deferred until next visit.       Syphilis   Syphilis stable and ranging between 1:1 and 1:2. No current active infection or indication for treatment. Discussed nature of RPR testing.Check RPR.       Relevant Medications   bictegravir-emtricitabine -tenofovir  AF (BIKTARVY ) 50-200-25 MG TABS tablet   Other Visit Diagnoses       Pharmacologic therapy       Relevant Orders   Lipid panel     Screening for STDs (sexually transmitted diseases)       Relevant Orders   RPR     Need for pneumococcal 20-valent conjugate vaccination       Relevant Orders   Pneumococcal conjugate vaccine 20-valent (Prevnar-20)        I am having Charles Morrison maintain his Multivitamin Adult, albuterol , levocetirizine, Olopatadine  HCl, fluticasone , azelastine , EPINEPHrine , and bictegravir-emtricitabine -tenofovir  AF.   Meds ordered this encounter  Medications   bictegravir-emtricitabine -tenofovir  AF (BIKTARVY ) 50-200-25 MG TABS tablet    Sig: Take 1 tablet by mouth daily.    Dispense:  30 tablet    Refill:  6    Please mail    Prescription Type::   Renewal     Follow-up: Return in about 6 months (around 09/02/2024). or sooner if needed.    Marlan Silva, MSN, FNP-C Nurse Practitioner [facility] for Infectious Disease [facility] Main number: [phone]

## 2024-03-03 NOTE — Assessment & Plan Note (Signed)
 Mr. Decesare continues to have well-controlled virus with good adherence and tolerance to Biktarvy .  Reviewed previous lab work and discussed plan of care and U equals U.  Covered by [place] [employer] with no problems obtaining medication from the pharmacy.  Social determinants of health reviewed with no interventions indicated.  Check lab work.  Continue current dose of Biktarvy .  Plan for follow-up in 6 months or sooner if needed with lab work on the same day.

## 2024-03-03 NOTE — Assessment & Plan Note (Addendum)
 Discussed importance of safe sexual practice and condom use. Not currently sexually active. Condoms offered.  Vaccinations reviewed and following counseling Menveo and Prevnar 20. Due for tetanus at next office visit. Due for routine dental care which he will schedule.  Discussed anal cancer screening and deferred until next visit.

## 2024-03-05 ENCOUNTER — Ambulatory Visit: Payer: Self-pay | Admitting: Family

## 2024-03-05 LAB — COMPLETE METABOLIC PANEL WITHOUT GFR
AG Ratio: 1.1 (calc) (ref 1.0–2.5)
ALT: 29 U/L (ref 9–46)
AST: 20 U/L (ref 10–40)
Albumin: 3.9 g/dL (ref 3.6–5.1)
Alkaline phosphatase (APISO): 80 U/L (ref 36–130)
BUN: 15 mg/dL (ref 7–25)
CO2: 28 mmol/L (ref 20–32)
Calcium: 9 mg/dL (ref 8.6–10.3)
Chloride: 104 mmol/L (ref 98–110)
Creat: 1.1 mg/dL (ref 0.60–1.29)
Globulin: 3.5 g/dL (ref 1.9–3.7)
Glucose, Bld: 115 mg/dL — ABNORMAL HIGH (ref 65–99)
Potassium: 4 mmol/L (ref 3.5–5.3)
Sodium: 138 mmol/L (ref 135–146)
Total Bilirubin: 0.4 mg/dL (ref 0.2–1.2)
Total Protein: 7.4 g/dL (ref 6.1–8.1)

## 2024-03-05 LAB — CBC WITH DIFFERENTIAL/PLATELET
Absolute Lymphocytes: 2570 {cells}/uL (ref 850–3900)
Absolute Monocytes: 352 {cells}/uL (ref 200–950)
Basophils Absolute: 41 {cells}/uL (ref 0–200)
Basophils Relative: 0.8 %
Eosinophils Absolute: 143 {cells}/uL (ref 15–500)
Eosinophils Relative: 2.8 %
HCT: 45.1 % (ref 38.5–50.0)
Hemoglobin: 14.5 g/dL (ref 13.2–17.1)
MCH: 27.1 pg (ref 27.0–33.0)
MCHC: 32.2 g/dL (ref 32.0–36.0)
MCV: 84.3 fL (ref 80.0–100.0)
MPV: 8.9 fL (ref 7.5–12.5)
Monocytes Relative: 6.9 %
Neutro Abs: 1994 {cells}/uL (ref 1500–7800)
Neutrophils Relative %: 39.1 %
Platelets: 316 10*3/uL (ref 140–400)
RBC: 5.35 10*6/uL (ref 4.20–5.80)
RDW: 16.8 % — ABNORMAL HIGH (ref 11.0–15.0)
Total Lymphocyte: 50.4 %
WBC: 5.1 10*3/uL (ref 3.8–10.8)

## 2024-03-05 LAB — HIV-1 RNA QUANT-NO REFLEX-BLD
HIV 1 RNA Quant: NOT DETECTED {copies}/mL
HIV-1 RNA Quant, Log: NOT DETECTED {Log_copies}/mL

## 2024-03-05 LAB — LIPID PANEL
Cholesterol: 213 mg/dL — ABNORMAL HIGH (ref <200)
HDL: 51 mg/dL (ref >=40)
LDL Cholesterol (Calc): 146 mg/dL — ABNORMAL HIGH
Non-HDL Cholesterol (Calc): 162 mg/dL — ABNORMAL HIGH (ref <130)
Total CHOL/HDL Ratio: 4.2 (calc) (ref <5.0)
Triglycerides: 65 mg/dL (ref <150)

## 2024-03-05 LAB — T-HELPER CELLS (CD4) COUNT (NOT AT ARMC)
Absolute CD4: 769 {cells}/uL (ref 490–1740)
CD4 T Helper %: 29 % — ABNORMAL LOW (ref 30–61)
Total lymphocyte count: 2618 {cells}/uL (ref 850–3900)

## 2024-03-05 LAB — RPR: RPR Ser Ql: NONREACTIVE

## 2024-03-10 ENCOUNTER — Other Ambulatory Visit: Payer: Self-pay

## 2024-03-13 ENCOUNTER — Other Ambulatory Visit: Payer: Self-pay

## 2024-03-16 ENCOUNTER — Other Ambulatory Visit: Payer: Self-pay | Admitting: Pharmacy Technician

## 2024-03-16 ENCOUNTER — Other Ambulatory Visit: Payer: Self-pay

## 2024-03-16 NOTE — Progress Notes (Signed)
 Specialty Pharmacy Refill Coordination Note  Charles Morrison is a 43 y.o. male contacted today regarding refills of specialty medication(s) Bictegravir-Emtricitab-Tenofov (BIKTARVY )   Patient requested Delivery   Delivery date: 03/24/24   Verified address: [address]  [place]   Medication will be filled on 03/23/24.

## 2024-03-23 ENCOUNTER — Other Ambulatory Visit: Payer: Self-pay

## 2024-04-16 ENCOUNTER — Other Ambulatory Visit (HOSPITAL_COMMUNITY): Payer: Self-pay

## 2024-04-20 ENCOUNTER — Encounter (INDEPENDENT_AMBULATORY_CARE_PROVIDER_SITE_OTHER): Payer: Self-pay

## 2024-04-20 ENCOUNTER — Other Ambulatory Visit: Payer: Self-pay

## 2024-04-20 NOTE — Progress Notes (Signed)
 Specialty Pharmacy Refill Coordination Note  Charles Morrison is a 43 y.o. male contacted today regarding refills of specialty medication(s) Bictegravir-Emtricitab-Tenofov (BIKTARVY )   Patient requested Delivery   Delivery date: 04/22/24   Verified address: [address] [place] [address]   Medication will be filled on 04/21/24.

## 2024-05-11 ENCOUNTER — Other Ambulatory Visit: Payer: Self-pay

## 2024-05-14 ENCOUNTER — Other Ambulatory Visit (HOSPITAL_COMMUNITY): Payer: Self-pay

## 2024-05-21 ENCOUNTER — Other Ambulatory Visit: Payer: Self-pay

## 2024-05-22 ENCOUNTER — Other Ambulatory Visit: Payer: Self-pay

## 2024-05-26 ENCOUNTER — Other Ambulatory Visit: Payer: Self-pay

## 2024-06-19 ENCOUNTER — Other Ambulatory Visit: Payer: Self-pay

## 2024-06-21 ENCOUNTER — Encounter (INDEPENDENT_AMBULATORY_CARE_PROVIDER_SITE_OTHER): Payer: Self-pay

## 2024-06-22 ENCOUNTER — Other Ambulatory Visit: Payer: Self-pay | Admitting: Pharmacy Technician

## 2024-06-22 ENCOUNTER — Other Ambulatory Visit: Payer: Self-pay

## 2024-06-22 NOTE — Progress Notes (Signed)
 Specialty Pharmacy Refill Coordination Note  Charles Morrison is a 43 y.o. male contacted today regarding refills of specialty medication(s) Bictegravir-Emtricitab-Tenofov (BIKTARVY )   Patient requested (Patient-Rptd) Delivery   Delivery date: 06/24/24 Verified address: (Patient-Rptd) [address] [place] [address]   Medication will be filled on 06/23/24.

## 2024-07-16 ENCOUNTER — Other Ambulatory Visit: Payer: Self-pay

## 2024-07-17 ENCOUNTER — Encounter (INDEPENDENT_AMBULATORY_CARE_PROVIDER_SITE_OTHER): Payer: Self-pay

## 2024-07-17 ENCOUNTER — Other Ambulatory Visit: Payer: Self-pay

## 2024-07-17 NOTE — Progress Notes (Signed)
 Specialty Pharmacy Refill Coordination Note  Charles Morrison is a 43 y.o. male contacted today regarding refills of specialty medication(s) Bictegravir-Emtricitab-Tenofov (BIKTARVY )   Patient requested (Patient-Rptd) Delivery   Delivery date: 07/22/24   Verified address: (Patient-Rptd) [address] [place] [address]   Medication will be filled on: 07/21/24

## 2024-07-20 ENCOUNTER — Other Ambulatory Visit: Payer: Self-pay

## 2024-08-13 ENCOUNTER — Other Ambulatory Visit (HOSPITAL_COMMUNITY): Payer: Self-pay

## 2024-08-17 ENCOUNTER — Other Ambulatory Visit: Payer: Self-pay

## 2024-08-19 ENCOUNTER — Other Ambulatory Visit: Payer: Self-pay

## 2024-08-27 ENCOUNTER — Encounter: Payer: Self-pay | Admitting: Family

## 2024-08-27 ENCOUNTER — Other Ambulatory Visit: Payer: Self-pay

## 2024-08-27 ENCOUNTER — Other Ambulatory Visit (HOSPITAL_COMMUNITY): Payer: Self-pay

## 2024-08-27 ENCOUNTER — Ambulatory Visit: Admitting: Family

## 2024-08-27 VITALS — BP 143/90 | HR 74 | Temp 98.1°F | Resp 16 | Wt 323.0 lb

## 2024-08-27 DIAGNOSIS — Z79899 Other long term (current) drug therapy: Secondary | ICD-10-CM | POA: Diagnosis not present

## 2024-08-27 DIAGNOSIS — Z23 Encounter for immunization: Secondary | ICD-10-CM | POA: Diagnosis not present

## 2024-08-27 DIAGNOSIS — R7301 Impaired fasting glucose: Secondary | ICD-10-CM

## 2024-08-27 DIAGNOSIS — B20 Human immunodeficiency virus [HIV] disease: Secondary | ICD-10-CM | POA: Diagnosis not present

## 2024-08-27 DIAGNOSIS — Z0000 Encounter for general adult medical examination without abnormal findings: Secondary | ICD-10-CM

## 2024-08-27 DIAGNOSIS — E7800 Pure hypercholesterolemia, unspecified: Secondary | ICD-10-CM | POA: Diagnosis not present

## 2024-08-27 MED ORDER — BICTEGRAVIR-EMTRICITAB-TENOFOV 50-200-25 MG PO TABS
1.0000 | ORAL_TABLET | Freq: Every day | ORAL | 6 refills | Status: AC
  Filled 2024-08-27: qty 30, 30d supply, fill #0

## 2024-08-27 NOTE — Progress Notes (Signed)
 Patient: Charles Morrison  DOB: [birth year] MRN: [id number] PCP: Pcp, No  Referring Provider:   Brief Narrative:  Mr. Waddell is a 43 y/o AA gentleman diagnosed with HIV in March 2019 with risk factor of MSM. Initial lab work on 05/15/18 with viral load of 154,000 and CD4 count 90. No genotype was able to be performed. Entered care at [facility] Stage III. History of PCP pneumonia. ART history with Biktarvy .   Reason for Visit: 55-month follow up  Chief Complaint  Patient presents with   Follow-up    B20      Subjective   Subjective:   Charles Morrison is a 43 year old male being seen in clinic for 34-month follow up on his HIV care. Since last being seen on 03/03/2024, he reports no recent sickness or illness. Current regimen includes daily Biktarvy  with daily adherence and denies nausea, vomiting, diarrhea, headaches, rashes, and insomnia. He reports no recent hospitalizations or ER visits. He states he is not sexually active at this time. He denies the use of tobacco and illegal substance and reports social drinking. He currently works in [employer] and states being chaotic, but is managing well. He reports no mood changes. He reports stable housing, food, and transportation. He has no other concerns at this time. He denies fever, chills, fatigue, appetite changes, chest pain, and shortness of breath.   Review of Systems  Constitutional: Negative.   Respiratory: Negative.    Cardiovascular: Negative.   Gastrointestinal: Negative.   Genitourinary: Negative.   Musculoskeletal: Negative.   Skin: Negative.   Neurological: Negative.   Psychiatric/Behavioral: Negative.      Past Medical History:  Diagnosis Date   HIV infection (HCC)    Medical history non-contributory     Outpatient Medications Prior to Visit  Medication Sig Dispense Refill   albuterol  (PROVENTIL  HFA;VENTOLIN  HFA) 108 (90 Base) MCG/ACT inhaler Inhale 2 puffs into the lungs every 6 (six) hours as needed for wheezing or  shortness of breath. 2 puffs 3 times daily x5 days then every 6 hours as needed. 1 Inhaler 0   azelastine  (ASTELIN ) 0.1 % nasal spray Place 2 sprays into both nostrils 2 (two) times daily as needed for rhinitis. Use in each nostril as directed 30 mL 5   EPINEPHrine  (AUVI-Q ) 0.3 mg/0.3 mL IJ SOAJ injection Inject 0.3 mg into the muscle as needed for anaphylaxis. 1 each 1   fluticasone  (FLONASE ) 50 MCG/ACT nasal spray Place 2 sprays into both nostrils daily as needed for allergies or rhinitis. 16 g 5   levocetirizine (XYZAL) 5 MG tablet Take 5 mg by mouth every evening.     Multiple Vitamins-Minerals (MULTIVITAMIN ADULT) CHEW Chew 1 tablet by mouth 2 (two) times daily.     Olopatadine  HCl (PATADAY ) 0.2 % SOLN Place 1 drop into both eyes daily as needed. 2.5 mL 5   bictegravir-emtricitabine -tenofovir  AF (BIKTARVY ) 50-200-25 MG TABS tablet Take 1 tablet by mouth daily. 30 tablet 6   No facility-administered medications prior to visit.     Allergies  Allergen Reactions   Amoxicillin  Rash    Also has mild lip and facial swelling   Shellfish Allergy Rash    AVOIDS ALL SHELLFISH AND FISH Also had mild lip and facial swelling    Social History   Tobacco Use   Smoking status: Never    Passive exposure: Never   Smokeless tobacco: Never  Vaping Use   Vaping status: Never Used  Substance Use Topics   Alcohol use:  Yes    Comment: Occasion   Drug use: No    Family History  Problem Relation Age of Onset   Arthritis Mother        Knee   Allergic rhinitis Mother    Asthma Mother    Allergic rhinitis Father    Asthma Father    Angioedema Neg Hx    Atopy Neg Hx    Eczema Neg Hx    Urticaria Neg Hx    Immunodeficiency Neg Hx        Objective   Objective:   Vitals:   08/27/24 0855 08/27/24 0856  BP:  (!) 152/101  Pulse:  74  Resp:  16  Temp:  98.1 F (36.7 C)  TempSrc:  Oral  SpO2:  98%  Weight: (!) 323 lb (146.5 kg)    Body mass index is 46.35 kg/m.  Physical  Exam Constitutional:      Appearance: Normal appearance.  Cardiovascular:     Rate and Rhythm: Normal rate and regular rhythm.  Pulmonary:     Effort: Pulmonary effort is normal.     Breath sounds: Normal breath sounds.  Musculoskeletal:        General: Normal range of motion.  Neurological:     Mental Status: He is alert and oriented to person, place, and time.  Psychiatric:        Mood and Affect: Mood normal.        Behavior: Behavior normal.        Thought Content: Thought content normal.        Judgment: Judgment normal.        Assessment & Plan:   Problem List Items Addressed This Visit       Other   HIV disease (HCC) - Primary   Relevant Medications   bictegravir-emtricitabine -tenofovir  AF (BIKTARVY ) 50-200-25 MG TABS tablet   Other Relevant Orders   T-helper cells (CD4) count   HIV 1 RNA quant-no reflex-bld   Comprehensive metabolic panel with GFR   Healthcare maintenance   Other Visit Diagnoses       Impaired fasting glucose       Relevant Orders   HgB A1c     Pure hypercholesterolemia           Assessment and Plan  -HIV disease Current regimen includes daily Biktarvy . He reports daily adherence and no adverse effects. He reports no barriers in accessing medication. Last documented viral load was undetectable and CD4 769. Kidney, liver, and electrolytes all within normal range. Will  order CMP with GFR, HIV-1 RNA and CD4 today. Will refill medication. Continue current dose of Biktarvy . Plan for follow up in 6 months or sooner if needed with lab work on the same day.   -Healthcare maintenance  Site specific GC/NAAT and RPR offered. He declines at this time as he states he is not sexually active. Preventive care reviewed, offered and counseled on anal pap smear. He declines at this time. Colonoscopy at [facility] at age 8. Immunizations reviewed offered COVID and HPV. Is agreeable to Tdap, COVID and will consider HPV at a later time, otherwise up to date. Dental  visit up to date as well.   -Impaired fasting glucose  Fasting glucose 115. He denies symptoms of frequent thirst and urination, fatigue, blurred vision, and numbness and tingling. Discussed increasing physical activity and diet modification. Will obtain HgA1c today to r/o prediabetes.   -Pure hypercholesterolemia  Cholesterol 213 and LDL 146. Discussed lifestyle modifications such as increasing  physical activity to 5 days a week, minimum 30 minutes a day. He stated he stopped going to the gym and is aware that he needs to resume. Also, discussed diet modification such as decreasing or avoiding fried/fast foods, saturated/trans fats, carbohydrates and increasing fiber, unsaturated fats, fruits, whole grains, and nuts. He states implementing lifestyle modifications before starting medication.     Orders Placed This Encounter  Procedures   T-helper cells (CD4) count   HIV 1 RNA quant-no reflex-bld   HgB A1c   Comprehensive metabolic panel with GFR    Meds ordered this encounter  Medications   bictegravir-emtricitabine -tenofovir  AF (BIKTARVY ) 50-200-25 MG TABS tablet    Sig: Take 1 tablet by mouth daily.    Dispense:  30 tablet    Refill:  6    Please mail    Supervising Provider:   LUIZ CHANNEL [phone]    Prescription Type::   Renewal    Return in about 6 months (around 02/25/2025).   I have reviewed and agree with the plan as written above with edits added where appropriate.   Greg Mandolin Falwell, NP [facility] for Infectious Disease [facility] [phone]

## 2024-08-27 NOTE — Patient Instructions (Signed)
 Nice to see you. ? ?We will check your lab work today. ? ?Continue to take your medication daily as prescribed. ? ?Refills have been sent to the pharmacy. ? ?Plan for follow up in 6 months or sooner if needed with lab work on the same day. ? ?Have a great day and stay safe! ? ?

## 2024-08-28 LAB — T-HELPER CELLS (CD4) COUNT (NOT AT ARMC)
CD4 % Helper T Cell: 26 % — ABNORMAL LOW (ref 33–65)
CD4 T Cell Abs: 475 /uL (ref 400–1790)

## 2024-08-29 LAB — COMPREHENSIVE METABOLIC PANEL WITH GFR
AG Ratio: 1.2 (calc) (ref 1.0–2.5)
ALT: 30 U/L (ref 9–46)
AST: 23 U/L (ref 10–40)
Albumin: 4.2 g/dL (ref 3.6–5.1)
Alkaline phosphatase (APISO): 71 U/L (ref 36–130)
BUN: 12 mg/dL (ref 7–25)
CO2: 27 mmol/L (ref 20–32)
Calcium: 9 mg/dL (ref 8.6–10.3)
Chloride: 102 mmol/L (ref 98–110)
Creat: 0.98 mg/dL (ref 0.60–1.29)
Globulin: 3.6 g/dL (ref 1.9–3.7)
Glucose, Bld: 105 mg/dL — ABNORMAL HIGH (ref 65–99)
Potassium: 3.9 mmol/L (ref 3.5–5.3)
Sodium: 137 mmol/L (ref 135–146)
Total Bilirubin: 0.6 mg/dL (ref 0.2–1.2)
Total Protein: 7.8 g/dL (ref 6.1–8.1)
eGFR: 98 mL/min/1.73m2 (ref >=60)

## 2024-08-29 LAB — HEMOGLOBIN A1C
Hgb A1c MFr Bld: 6.1 % — ABNORMAL HIGH (ref <5.7)
Mean Plasma Glucose: 128 mg/dL
eAG (mmol/L): 7.1 mmol/L

## 2024-08-29 LAB — HIV-1 RNA QUANT-NO REFLEX-BLD
HIV 1 RNA Quant: 128 {copies}/mL — ABNORMAL HIGH
HIV-1 RNA Quant, Log: 2.11 {Log_copies}/mL — ABNORMAL HIGH

## 2024-08-31 ENCOUNTER — Ambulatory Visit: Payer: Self-pay | Admitting: Family

## 2024-09-04 ENCOUNTER — Other Ambulatory Visit: Payer: Self-pay

## 2024-09-04 ENCOUNTER — Encounter (INDEPENDENT_AMBULATORY_CARE_PROVIDER_SITE_OTHER): Payer: Self-pay

## 2024-10-11 ENCOUNTER — Other Ambulatory Visit (HOSPITAL_COMMUNITY): Payer: Self-pay

## 2024-10-11 ENCOUNTER — Encounter (HOSPITAL_COMMUNITY): Payer: Self-pay

## 2024-10-12 ENCOUNTER — Other Ambulatory Visit (HOSPITAL_COMMUNITY): Payer: Self-pay

## 2025-03-04 ENCOUNTER — Ambulatory Visit: Admitting: Family
